# Patient Record
Sex: Female | Born: 1955
Health system: Southern US, Community
[De-identification: ages and names within clinical notes are randomized; demographics above are authoritative.]

## PROBLEM LIST (undated history)

## (undated) DIAGNOSIS — N8501 Benign endometrial hyperplasia: Secondary | ICD-10-CM

## (undated) DIAGNOSIS — R0681 Apnea, not elsewhere classified: Secondary | ICD-10-CM

## (undated) DIAGNOSIS — T8859XA Other complications of anesthesia, initial encounter: Secondary | ICD-10-CM

## (undated) DIAGNOSIS — N951 Menopausal and female climacteric states: Secondary | ICD-10-CM

## (undated) DIAGNOSIS — E669 Obesity, unspecified: Secondary | ICD-10-CM

## (undated) DIAGNOSIS — N950 Postmenopausal bleeding: Secondary | ICD-10-CM

## (undated) DIAGNOSIS — L719 Rosacea, unspecified: Secondary | ICD-10-CM

## (undated) DIAGNOSIS — E079 Disorder of thyroid, unspecified: Secondary | ICD-10-CM

## (undated) DIAGNOSIS — G4730 Sleep apnea, unspecified: Secondary | ICD-10-CM

## (undated) DIAGNOSIS — Z9109 Other allergy status, other than to drugs and biological substances: Secondary | ICD-10-CM

## (undated) DIAGNOSIS — F419 Anxiety disorder, unspecified: Secondary | ICD-10-CM

## (undated) DIAGNOSIS — I739 Peripheral vascular disease, unspecified: Secondary | ICD-10-CM

## (undated) DIAGNOSIS — K5792 Diverticulitis of intestine, part unspecified, without perforation or abscess without bleeding: Secondary | ICD-10-CM

## (undated) DIAGNOSIS — I10 Essential (primary) hypertension: Secondary | ICD-10-CM

## (undated) DIAGNOSIS — R3915 Urgency of urination: Secondary | ICD-10-CM

## (undated) HISTORY — DX: Diverticulitis of intestine, part unspecified, without perforation or abscess without bleeding: K57.92

## (undated) HISTORY — DX: Benign endometrial hyperplasia: N85.01

## (undated) HISTORY — PX: CHOLECYSTECTOMY: SHX55

## (undated) HISTORY — PX: HYSTEROSCOPY: SHX211

## (undated) HISTORY — DX: Obesity, unspecified: E66.9

## (undated) HISTORY — DX: Menopausal and female climacteric states: N95.1

## (undated) HISTORY — PX: DILATION AND CURETTAGE OF UTERUS: SHX78

## (undated) HISTORY — DX: Postmenopausal bleeding: N95.0

## (undated) HISTORY — DX: Urgency of urination: R39.15

## (undated) HISTORY — DX: Other allergy status, other than to drugs and biological substances: Z91.09

## (undated) HISTORY — DX: Anxiety disorder, unspecified: F41.9

## (undated) HISTORY — DX: Peripheral vascular disease, unspecified: I73.9

## (undated) HISTORY — DX: Rosacea, unspecified: L71.9

---

## 2005-04-06 ENCOUNTER — Ambulatory Visit: Payer: Self-pay

## 2005-06-03 ENCOUNTER — Ambulatory Visit: Payer: Self-pay | Admitting: Gastroenterology

## 2005-06-14 ENCOUNTER — Encounter: Payer: Self-pay | Admitting: Internal Medicine

## 2005-08-04 ENCOUNTER — Ambulatory Visit: Payer: Self-pay | Admitting: Gastroenterology

## 2005-11-15 ENCOUNTER — Encounter: Payer: Self-pay | Admitting: Internal Medicine

## 2006-04-27 ENCOUNTER — Ambulatory Visit: Payer: Self-pay

## 2007-06-14 ENCOUNTER — Ambulatory Visit: Payer: Self-pay

## 2007-07-03 ENCOUNTER — Ambulatory Visit: Payer: Self-pay | Admitting: Internal Medicine

## 2007-09-03 ENCOUNTER — Ambulatory Visit: Payer: Self-pay | Admitting: Internal Medicine

## 2007-12-19 ENCOUNTER — Ambulatory Visit: Payer: Self-pay | Admitting: Obstetrics and Gynecology

## 2007-12-19 ENCOUNTER — Other Ambulatory Visit: Payer: Self-pay

## 2007-12-28 ENCOUNTER — Ambulatory Visit: Payer: Self-pay | Admitting: Obstetrics and Gynecology

## 2008-07-02 ENCOUNTER — Ambulatory Visit: Payer: Self-pay | Admitting: Internal Medicine

## 2008-07-08 ENCOUNTER — Ambulatory Visit: Payer: Self-pay | Admitting: Internal Medicine

## 2009-01-14 ENCOUNTER — Ambulatory Visit: Payer: Self-pay | Admitting: Internal Medicine

## 2009-07-27 ENCOUNTER — Ambulatory Visit: Payer: Self-pay | Admitting: Internal Medicine

## 2009-08-05 ENCOUNTER — Ambulatory Visit: Payer: Self-pay | Admitting: Internal Medicine

## 2010-07-26 ENCOUNTER — Ambulatory Visit: Payer: Self-pay

## 2010-08-06 ENCOUNTER — Ambulatory Visit: Payer: Self-pay | Admitting: Internal Medicine

## 2010-12-17 ENCOUNTER — Emergency Department: Payer: Self-pay | Admitting: Emergency Medicine

## 2011-09-27 ENCOUNTER — Ambulatory Visit: Payer: Self-pay | Admitting: Internal Medicine

## 2011-12-28 ENCOUNTER — Ambulatory Visit: Payer: Self-pay | Admitting: Obstetrics and Gynecology

## 2012-02-09 ENCOUNTER — Other Ambulatory Visit: Payer: Self-pay | Admitting: Gastroenterology

## 2012-02-09 LAB — CBC WITH DIFFERENTIAL/PLATELET
Basophil #: 0.1 10*3/uL (ref 0.0–0.1)
Eosinophil %: 2.7 %
HCT: 37.2 % (ref 35.0–47.0)
MCH: 24.6 pg — ABNORMAL LOW (ref 26.0–34.0)
MCHC: 31.3 g/dL — ABNORMAL LOW (ref 32.0–36.0)
Monocyte %: 4.3 %
Neutrophil %: 67.3 %
Platelet: 211 10*3/uL (ref 150–440)
WBC: 8.2 10*3/uL (ref 3.6–11.0)

## 2012-02-16 ENCOUNTER — Other Ambulatory Visit: Payer: Self-pay | Admitting: Obstetrics and Gynecology

## 2012-02-16 LAB — BASIC METABOLIC PANEL
Anion Gap: 8 (ref 7–16)
EGFR (African American): >60
EGFR (Non-African Amer.): >60
Potassium: 3.4 mmol/L — ABNORMAL LOW (ref 3.5–5.1)

## 2012-02-16 LAB — PROTIME-INR
INR: 1
Prothrombin Time: 13.6 secs (ref 11.5–14.7)

## 2012-02-16 LAB — IRON AND TIBC
Iron Saturation: 9 %
Unbound Iron-Bind.Cap.: 328 ug/dL

## 2012-02-16 LAB — RETICULOCYTES: Reticulocyte: 3.2 % — ABNORMAL HIGH (ref 0.5–1.5)

## 2012-02-16 LAB — APTT: Activated PTT: 31.7 secs (ref 23.6–35.9)

## 2012-02-16 LAB — FERRITIN: Ferritin (ARMC): 50 ng/mL (ref 8–388)

## 2012-03-28 ENCOUNTER — Ambulatory Visit: Payer: Self-pay | Admitting: Gastroenterology

## 2012-05-16 ENCOUNTER — Ambulatory Visit: Payer: Self-pay | Admitting: Specialist

## 2012-09-27 ENCOUNTER — Ambulatory Visit: Payer: Self-pay | Admitting: Internal Medicine

## 2013-01-01 ENCOUNTER — Ambulatory Visit: Payer: Self-pay | Admitting: Specialist

## 2013-10-01 ENCOUNTER — Ambulatory Visit: Payer: Self-pay | Admitting: Internal Medicine

## 2014-03-11 ENCOUNTER — Ambulatory Visit: Payer: Self-pay | Admitting: Obstetrics and Gynecology

## 2014-04-10 DIAGNOSIS — I10 Essential (primary) hypertension: Secondary | ICD-10-CM | POA: Insufficient documentation

## 2014-04-10 DIAGNOSIS — D649 Anemia, unspecified: Secondary | ICD-10-CM | POA: Insufficient documentation

## 2014-04-10 DIAGNOSIS — G4733 Obstructive sleep apnea (adult) (pediatric): Secondary | ICD-10-CM | POA: Insufficient documentation

## 2014-04-10 DIAGNOSIS — E039 Hypothyroidism, unspecified: Secondary | ICD-10-CM | POA: Insufficient documentation

## 2014-04-10 DIAGNOSIS — E669 Obesity, unspecified: Secondary | ICD-10-CM | POA: Insufficient documentation

## 2014-04-10 DIAGNOSIS — E785 Hyperlipidemia, unspecified: Secondary | ICD-10-CM | POA: Insufficient documentation

## 2014-04-10 DIAGNOSIS — R739 Hyperglycemia, unspecified: Secondary | ICD-10-CM | POA: Insufficient documentation

## 2014-04-10 DIAGNOSIS — M549 Dorsalgia, unspecified: Secondary | ICD-10-CM | POA: Insufficient documentation

## 2014-05-09 ENCOUNTER — Ambulatory Visit: Payer: Self-pay | Admitting: Orthopedic Surgery

## 2014-11-12 DIAGNOSIS — M5116 Intervertebral disc disorders with radiculopathy, lumbar region: Secondary | ICD-10-CM | POA: Insufficient documentation

## 2014-11-12 DIAGNOSIS — M7060 Trochanteric bursitis, unspecified hip: Secondary | ICD-10-CM | POA: Insufficient documentation

## 2014-11-12 DIAGNOSIS — M48061 Spinal stenosis, lumbar region without neurogenic claudication: Secondary | ICD-10-CM | POA: Insufficient documentation

## 2015-02-27 ENCOUNTER — Other Ambulatory Visit: Payer: Self-pay | Admitting: Obstetrics and Gynecology

## 2015-02-27 DIAGNOSIS — Z1231 Encounter for screening mammogram for malignant neoplasm of breast: Secondary | ICD-10-CM

## 2015-03-16 ENCOUNTER — Other Ambulatory Visit: Payer: Self-pay | Admitting: Obstetrics and Gynecology

## 2015-03-16 ENCOUNTER — Ambulatory Visit
Admission: RE | Admit: 2015-03-16 | Discharge: 2015-03-16 | Disposition: A | Discharge status: Home / Self Care | Payer: 59 | Source: Ambulatory Visit | Attending: Obstetrics and Gynecology | Admitting: Obstetrics and Gynecology

## 2015-03-16 DIAGNOSIS — Z1231 Encounter for screening mammogram for malignant neoplasm of breast: Secondary | ICD-10-CM

## 2015-07-17 ENCOUNTER — Emergency Department: Payer: 59

## 2015-07-17 ENCOUNTER — Encounter: Payer: Self-pay | Admitting: Emergency Medicine

## 2015-07-17 ENCOUNTER — Emergency Department
Admission: EM | Admit: 2015-07-17 | Discharge: 2015-07-17 | Disposition: A | Discharge status: Home / Self Care | Payer: 59 | Attending: Emergency Medicine | Admitting: Emergency Medicine

## 2015-07-17 DIAGNOSIS — R079 Chest pain, unspecified: Secondary | ICD-10-CM | POA: Insufficient documentation

## 2015-07-17 DIAGNOSIS — I10 Essential (primary) hypertension: Secondary | ICD-10-CM | POA: Diagnosis not present

## 2015-07-17 DIAGNOSIS — E669 Obesity, unspecified: Secondary | ICD-10-CM | POA: Insufficient documentation

## 2015-07-17 DIAGNOSIS — R05 Cough: Secondary | ICD-10-CM | POA: Diagnosis not present

## 2015-07-17 DIAGNOSIS — R0602 Shortness of breath: Secondary | ICD-10-CM | POA: Diagnosis not present

## 2015-07-17 DIAGNOSIS — R5383 Other fatigue: Secondary | ICD-10-CM | POA: Insufficient documentation

## 2015-07-17 DIAGNOSIS — Z880 Allergy status to penicillin: Secondary | ICD-10-CM | POA: Insufficient documentation

## 2015-07-17 HISTORY — DX: Apnea, not elsewhere classified: R06.81

## 2015-07-17 HISTORY — DX: Disorder of thyroid, unspecified: E07.9

## 2015-07-17 HISTORY — DX: Essential (primary) hypertension: I10

## 2015-07-17 LAB — URINALYSIS COMPLETE WITH MICROSCOPIC (ARMC ONLY)
BACTERIA UA: NONE SEEN
Bilirubin Urine: NEGATIVE
GLUCOSE, UA: NEGATIVE mg/dL
HGB URINE DIPSTICK: NEGATIVE
Ketones, ur: NEGATIVE mg/dL
NITRITE: NEGATIVE
PH: 6 (ref 5.0–8.0)
PROTEIN: NEGATIVE mg/dL
SPECIFIC GRAVITY, URINE: 1.029 (ref 1.005–1.030)

## 2015-07-17 LAB — COMPREHENSIVE METABOLIC PANEL
ALT: 26 U/L (ref 14–54)
AST: 27 U/L (ref 15–41)
Albumin: 3.7 g/dL (ref 3.5–5.0)
Alkaline Phosphatase: 65 U/L (ref 38–126)
Anion gap: 8 (ref 5–15)
BUN: 13 mg/dL (ref 6–20)
CHLORIDE: 100 mmol/L — AB (ref 101–111)
CO2: 29 mmol/L (ref 22–32)
CREATININE: 1.03 mg/dL — AB (ref 0.44–1.00)
Calcium: 9 mg/dL (ref 8.9–10.3)
GFR, EST NON AFRICAN AMERICAN: 58 mL/min — AB (ref >60)
Glucose, Bld: 108 mg/dL — ABNORMAL HIGH (ref 65–99)
POTASSIUM: 3.5 mmol/L (ref 3.5–5.1)
SODIUM: 137 mmol/L (ref 135–145)
Total Bilirubin: 0.6 mg/dL (ref 0.3–1.2)
Total Protein: 7.4 g/dL (ref 6.5–8.1)

## 2015-07-17 LAB — CBC
HCT: 35.2 % (ref 35.0–47.0)
HEMOGLOBIN: 11.7 g/dL — AB (ref 12.0–16.0)
MCH: 26.4 pg (ref 26.0–34.0)
MCHC: 33.2 g/dL (ref 32.0–36.0)
MCV: 79.6 fL — ABNORMAL LOW (ref 80.0–100.0)
PLATELETS: 207 10*3/uL (ref 150–440)
RBC: 4.42 MIL/uL (ref 3.80–5.20)
RDW: 14.9 % — ABNORMAL HIGH (ref 11.5–14.5)
WBC: 10.1 10*3/uL (ref 3.6–11.0)

## 2015-07-17 LAB — TROPONIN I

## 2015-07-17 MED ORDER — IOHEXOL 300 MG/ML  SOLN
75.0000 mL | Freq: Once | INTRAMUSCULAR | Status: AC | PRN
  Administered 2015-07-17: 75 mL via INTRAVENOUS

## 2015-07-17 NOTE — ED Provider Notes (Signed)
[facility] Emergency Department Provider Note     Time seen: ----------------------------------------- 5:13 PM on 07/17/2015 -----------------------------------------    I have reviewed the triage vital signs and the nursing notes.   HISTORY  Chief Complaint Chest Pain    HPI Rebekah Perkins is a 59 y.o. female presents to ER for chest pressure over the last couple days. Patient been treated for cough about 2 weeks ago but hasn't gotten any better. Patient was sent over from employee health, denies fevers, chills. She does have shortness of breath that is worsened with exertion. She's not had sweats or nausea. According to reports she has history of thyroid disease hypertension and apnea.   Past Medical History  Diagnosis Date  . Thyroid disease   . Hypertension   . Apnea     There are no active problems to display for this patient.   History reviewed. No pertinent past surgical history.  Allergies Azithromycin and Penicillins  Social History Social History  Substance Use Topics  . Smoking status: Never Smoker   . Smokeless tobacco: None  . Alcohol Use: No    Review of Systems Constitutional: Negative for fever. Eyes: Negative for visual changes. ENT: Negative for sore throat. Cardiovascular: Positive for chest pressure Respiratory: Positive for shortness of breath, cough Gastrointestinal: Negative for abdominal pain, vomiting and diarrhea. Genitourinary: Negative for dysuria. Musculoskeletal: Negative for back pain. Skin: Negative for rash. Neurological: Negative for headaches, nausea for weakness  10-point ROS otherwise negative.  ____________________________________________   PHYSICAL EXAM:  VITAL SIGNS: ED Triage Vitals  Enc Vitals Group     BP 07/17/15 1651 100/59 mmHg     Pulse Rate 07/17/15 1651 71     Resp 07/17/15 1651 20     Temp 07/17/15 1651 97.6 F (36.4 C)     Temp Source 07/17/15 1651 Oral     SpO2  07/17/15 1651 95 %     Weight 07/17/15 1651 306 lb (138.801 kg)     Height 07/17/15 1651 5\' 3"  (1.6 m)     Head Cir --      Peak Flow --      Pain Score --      Pain Loc --      Pain Edu? --      Excl. in [place]? --     Constitutional: Alert and oriented. Mild distress, obese Eyes: Conjunctivae are normal. PERRL. Normal extraocular movements. ENT   Head: Normocephalic and atraumatic.   Nose: No congestion/rhinnorhea.   Mouth/Throat: Mucous membranes are moist.   Neck: No stridor. Cardiovascular: Normal rate, regular rhythm. Normal and symmetric distal pulses are present in all extremities. No murmurs, rubs, or gallops. Respiratory: Normal respiratory effort without tachypnea nor retractions. Breath sounds are clear and equal bilaterally. No wheezes/rales/rhonchi. Gastrointestinal: Soft and nontender. No distention. No abdominal bruits.  Musculoskeletal: Nontender with normal range of motion in all extremities. No joint effusions.  No lower extremity tenderness nor edema. Neurologic:  Normal speech and language. No gross focal neurologic deficits are appreciated. Speech is normal. No gait instability. Skin:  Skin is warm, dry and intact. No rash noted. Psychiatric: Mood and affect are normal. Speech and behavior are normal. Patient exhibits appropriate insight and judgment. ____________________________________________  EKG: Interpreted by me. Normal sinus rhythm with a rate of 73 bpm, normal PR interval, normal QS with, normal QT interval.  ____________________________________________  ED COURSE:  Pertinent labs & imaging results that were available during my care of the patient  were reviewed by me and considered in my medical decision making (see chart for details). Patient appears fatigued and short of breath on arrival. She was having nonspecific chest pressure. We'll check cardiac labs and reevaluate. ____________________________________________    LABS (pertinent  positives/negatives)  Labs Reviewed  CBC - Abnormal; Notable for the following:    Hemoglobin 11.7 (*)    MCV 79.6 (*)    RDW 14.9 (*)    All other components within normal limits  COMPREHENSIVE METABOLIC PANEL - Abnormal; Notable for the following:    Chloride 100 (*)    Glucose, Bld 108 (*)    Creatinine, Ser 1.03 (*)    GFR calc non Af Amer 58 (*)    All other components within normal limits  URINALYSIS COMPLETEWITH MICROSCOPIC ([facility] ONLY) - Abnormal; Notable for the following:    Color, Urine YELLOW (*)    APPearance HAZY (*)    Leukocytes, UA 1+ (*)    Squamous Epithelial / LPF 6-30 (*)    All other components within normal limits  TROPONIN I  TROPONIN I    RADIOLOGY Images were viewed by me  Chest x-ray IMPRESSION: 1. No acute findings in the thorax to account for the patient's symptoms. 2. There are subtle findings in the dependent portions of the lungs bilateral which could indicate very early changes of interstitial lung disease. Repeat high-resolution chest CT is recommended in 6-12 months to assess for temporal changes in the appearance of the lung parenchyma. 3. Hepatic steatosis with probable hepatomegaly. 4. Splenomegaly.   ____________________________________________  FINAL ASSESSMENT AND PLAN  Chest pressure, fatigue  Plan: Patient with labs and imaging as dictated above. Patient is in no acute distress, is refusing to have her second troponin drawn. At this point I am unclear of the etiology of her fatigue and weakness. She is low risk according to her heart score. Stable for outpatient follow-up with her doctor   Earleen Newport, MD   Earleen Newport, MD 07/17/15 2107

## 2015-07-17 NOTE — ED Notes (Signed)
Patient to ED with report of chest pressure over the last couple of days, patient treated for cough about 2 weeks ago but it hasn't gotten better. Patient sent over from employee health.

## 2015-07-17 NOTE — Discharge Instructions (Signed)
Chest Pain (Nonspecific) °It is often hard to give a specific diagnosis for the cause of chest pain. There is always a chance that your pain could be related to something serious, such as a heart attack or a blood clot in the lungs. You need to follow up with your health care provider for further evaluation. °CAUSES  °· Heartburn. °· Pneumonia or bronchitis. °· Anxiety or stress. °· Inflammation around your heart (pericarditis) or lung (pleuritis or pleurisy). °· A blood clot in the lung. °· A collapsed lung (pneumothorax). It can develop suddenly on its own (spontaneous pneumothorax) or from trauma to the chest. °· Shingles infection (herpes zoster virus). °The chest wall is composed of bones, muscles, and cartilage. Any of these can be the source of the pain. °· The bones can be bruised by injury. °· The muscles or cartilage can be strained by coughing or overwork. °· The cartilage can be affected by inflammation and become sore (costochondritis). °DIAGNOSIS  °Lab tests or other studies may be needed to find the cause of your pain. Your health care provider may have you take a test called an ambulatory electrocardiogram (ECG). An ECG records your heartbeat patterns over a 24-hour period. You may also have other tests, such as: °· Transthoracic echocardiogram (TTE). During echocardiography, sound waves are used to evaluate how blood flows through your heart. °· Transesophageal echocardiogram (TEE). °· Cardiac monitoring. This allows your health care provider to monitor your heart rate and rhythm in real time. °· Holter monitor. This is a portable device that records your heartbeat and can help diagnose heart arrhythmias. It allows your health care provider to track your heart activity for several days, if needed. °· Stress tests by exercise or by giving medicine that makes the heart beat faster. °TREATMENT  °· Treatment depends on what may be causing your chest pain. Treatment may include: °· Acid blockers for  heartburn. °· Anti-inflammatory medicine. °· Pain medicine for inflammatory conditions. °· Antibiotics if an infection is present. °· You may be advised to change lifestyle habits. This includes stopping smoking and avoiding alcohol, caffeine, and chocolate. °· You may be advised to keep your head raised (elevated) when sleeping. This reduces the chance of acid going backward from your stomach into your esophagus. °Most of the time, nonspecific chest pain will improve within 2-3 days with rest and mild pain medicine.  °HOME CARE INSTRUCTIONS  °· If antibiotics were prescribed, take them as directed. Finish them even if you start to feel better. °· For the next few days, avoid physical activities that bring on chest pain. Continue physical activities as directed. °· Do not use any tobacco products, including cigarettes, chewing tobacco, or electronic cigarettes. °· Avoid drinking alcohol. °· Only take medicine as directed by your health care provider. °· Follow your health care provider's suggestions for further testing if your chest pain does not go away. °· Keep any follow-up appointments you made. If you do not go to an appointment, you could develop lasting (chronic) problems with pain. If there is any problem keeping an appointment, call to reschedule. °SEEK MEDICAL CARE IF:  °· Your chest pain does not go away, even after treatment. °· You have a rash with blisters on your chest. °· You have a fever. °SEEK IMMEDIATE MEDICAL CARE IF:  °· You have increased chest pain or pain that spreads to your arm, neck, jaw, back, or abdomen. °· You have shortness of breath. °· You have an increasing cough, or you cough   up blood. °· You have severe back or abdominal pain. °· You feel nauseous or vomit. °· You have severe weakness. °· You faint. °· You have chills. °This is an emergency. Do not wait to see if the pain will go away. Get medical help at once. Call your local emergency services (911 in [place] Do not drive  yourself to the hospital. °MAKE SURE YOU:  °· Understand these instructions. °· Will watch your condition. °· Will get help right away if you are not doing well or get worse. °Document Released: 08/10/2005 Document Revised: 11/05/2013 Document Reviewed: 06/05/2008 °ExitCare® Patient Information ©2015 [employer]. This information is not intended to replace advice given to you by your health care provider. Make sure you discuss any questions you have with your health care provider. ° °Weakness °Weakness is a lack of strength. It may be felt all over the body (generalized) or in one specific part of the body (focal). Some causes of weakness can be serious. You may need further medical evaluation, especially if you are elderly or you have a history of immunosuppression (such as chemotherapy or HIV), kidney disease, heart disease, or diabetes. °CAUSES  °Weakness can be caused by many different things, including: °· Infection. °· Physical exhaustion. °· Internal bleeding or other blood loss that results in a lack of red blood cells (anemia). °· Dehydration. This cause is more common in elderly people. °· Side effects or electrolyte abnormalities from medicines, such as pain medicines or sedatives. °· Emotional distress, anxiety, or depression. °· Circulation problems, especially severe peripheral arterial disease. °· Heart disease, such as rapid atrial fibrillation, bradycardia, or heart failure. °· Nervous system disorders, such as Guillain-Barré syndrome, multiple sclerosis, or stroke. °DIAGNOSIS  °To find the cause of your weakness, your caregiver will take your history and perform a physical exam. Lab tests or X-rays may also be ordered, if needed. °TREATMENT  °Treatment of weakness depends on the cause of your symptoms and can vary greatly. °HOME CARE INSTRUCTIONS  °· Rest as needed. °· Eat a well-balanced diet. °· Try to get some exercise every day. °· Only take over-the-counter or prescription medicines as  directed by your caregiver. °SEEK MEDICAL CARE IF:  °· Your weakness seems to be getting worse or spreads to other parts of your body. °· You develop new aches or pains. °SEEK IMMEDIATE MEDICAL CARE IF:  °· You cannot perform your normal daily activities, such as getting dressed and feeding yourself. °· You cannot walk up and down stairs, or you feel exhausted when you do so. °· You have shortness of breath or chest pain. °· You have difficulty moving parts of your body. °· You have weakness in only one area of the body or on only one side of the body. °· You have a fever. °· You have trouble speaking or swallowing. °· You cannot control your bladder or bowel movements. °· You have black or bloody vomit or stools. °MAKE SURE YOU: °· Understand these instructions. °· Will watch your condition. °· Will get help right away if you are not doing well or get worse. °Document Released: 10/31/2005 Document Revised: 05/01/2012 Document Reviewed: 12/30/2011 °ExitCare® Patient Information ©2015 [employer]. This information is not intended to replace advice given to you by your health care provider. Make sure you discuss any questions you have with your health care provider. ° °

## 2015-07-21 ENCOUNTER — Ambulatory Visit (INDEPENDENT_AMBULATORY_CARE_PROVIDER_SITE_OTHER): Payer: 59 | Admitting: Obstetrics and Gynecology

## 2015-07-21 ENCOUNTER — Other Ambulatory Visit: Payer: 59

## 2015-07-21 ENCOUNTER — Encounter: Payer: Self-pay | Admitting: Obstetrics and Gynecology

## 2015-07-21 DIAGNOSIS — R938 Abnormal findings on diagnostic imaging of other specified body structures: Secondary | ICD-10-CM

## 2015-07-21 DIAGNOSIS — N950 Postmenopausal bleeding: Secondary | ICD-10-CM

## 2015-07-21 DIAGNOSIS — R9389 Abnormal findings on diagnostic imaging of other specified body structures: Secondary | ICD-10-CM

## 2015-07-21 DIAGNOSIS — K5790 Diverticulosis of intestine, part unspecified, without perforation or abscess without bleeding: Secondary | ICD-10-CM | POA: Insufficient documentation

## 2015-07-21 DIAGNOSIS — N8501 Benign endometrial hyperplasia: Secondary | ICD-10-CM | POA: Diagnosis not present

## 2015-07-21 NOTE — Progress Notes (Signed)
Patient ID: Rebekah Perkins, female   DOB: August 03, 1956, 59 y.o.   MRN: [id number]  U/s results- ? emb pmb  Chief complaint: 1.  History of endometrial hyperplasia without atypia. 2.  Follow-up on pelvic ultrasound.  Patient is a 59 year old white female, on no HRT therapy, on no progestin therapy, presents for follow-up on pelvic ultrasound done earlier today.  Previously, she had a diagnosis of endometrial hyperplasia without atypia; last ultrasound was 4.3 mm endometrial stripe; she is here now for 6 month follow-up ultrasound and possible biopsy.  Patient denies any abnormal bleeding since her last evaluation 6 months ago.  Previously, she also had a hysteroscopy D&C for simple hyperplasia without atypia.  Past medical history, past surgical history, problem list, medications, and allergies are reviewed and updated.  Review of systems: No abnormal uterine bleeding or vaginal discharge.  OBJECTIVE: BP 112/72 mmHg  Pulse 76  Ht 5\' 3"  (1.6 m)  Wt 299 lb 8 oz (135.852 kg)  BMI 53.07 kg/m2 Pleasant, well-appearing, morbidly obese Asian infant in no acute distress. Abdomen: Soft, protuberant, without organomegaly. Pelvic exam: Bimanual exam reveals a midplane uterus; size difficult to assess.  Endometrial Biopsy Procedure Note  Pre-operative Diagnosis:  1.  History of simple endometrial hyperplasia without atypia. 2.  Abnormal pelvic ultrasound with thickened endometrium 6.4 mm  Post-operative Diagnosis:  Same  Procedure Details   Urine pregnancy test was not done.  The risks (including infection, bleeding, pain, and uterine perforation) and benefits of the procedure were explained to the patient and Verbal informed consent was obtained.  Antibiotic prophylaxis against endocarditis was not indicated.   The patient was placed in the dorsal lithotomy position.  Bimanual exam showed the uterus to be in the neutral position.  A Graves' speculum inserted in the vagina, and the cervix  prepped with povidone iodine.  Endocervical curettage with a Kevorkian curette was not performed.   A sharp tenaculum was applied to the anterior lip of the cervix for stabilization.  A sterile uterine sound was used to sound the uterus to a depth of 8cm.  A Mylex 36mm curette was used to sample the endometrium.  Sample was sent for pathologic examination.This sample was scant.  Condition: Stable  Complications: None  Plan:  The patient was advised to call for any fever or for prolonged or severe pain or bleeding. She was advised to use OTC acetaminophen and OTC ibuprofen as needed for mild to moderate pain. She was advised to avoid vaginal intercourse for 48 hours or until the bleeding has completely stopped.  Attending Physician Documentation: Brayton Mars, MD   IMPRESSION: 1.  History of simple endometrial hyperplasia without atypia. 2.  Abnormal pelvic ultrasound today with thickened endometrial stripe at 6.4 mm 3.  Endometrial biopsy performed.  PLAN: 1.  Tylenol and Advil as needed for cramps. 2.  Return in 1 week for follow up on biopsy results and further management planning. 3.  Postbiopsy precautions given.  Brayton Mars, MD

## 2015-07-21 NOTE — Patient Instructions (Signed)
.  1.  Ultrasound showed thickened endometrium. 2.  Endometrial biopsy is performed today. 3.  Return in 1 week for follow-up. 4.  Tylenol/Advil as needed for cramps

## 2015-07-22 LAB — PATHOLOGY

## 2015-07-28 ENCOUNTER — Encounter: Payer: Self-pay | Admitting: Obstetrics and Gynecology

## 2015-07-28 ENCOUNTER — Ambulatory Visit (INDEPENDENT_AMBULATORY_CARE_PROVIDER_SITE_OTHER): Payer: 59 | Admitting: Obstetrics and Gynecology

## 2015-07-28 VITALS — BP 110/72 | HR 77 | Ht 63.0 in | Wt 301.5 lb

## 2015-07-28 DIAGNOSIS — N8501 Benign endometrial hyperplasia: Secondary | ICD-10-CM

## 2015-07-28 DIAGNOSIS — R938 Abnormal findings on diagnostic imaging of other specified body structures: Secondary | ICD-10-CM | POA: Diagnosis not present

## 2015-07-28 DIAGNOSIS — R9389 Abnormal findings on diagnostic imaging of other specified body structures: Secondary | ICD-10-CM

## 2015-07-28 MED ORDER — MEDROXYPROGESTERONE ACETATE 10 MG PO TABS: 10.0000 mg | ORAL_TABLET | Freq: Every day | ORAL | Status: DC

## 2015-07-28 NOTE — Patient Instructions (Signed)
1.  Take Provera 10 mg a day, days 1 through 10 each month starting in October 2016. 2.  Use the menstrual calendar, monitoring card to document any bleeding. 3.  An ultrasound of the pelvis is scheduled for mid March 2017. 4.  Return 1 week after the ultrasound for further management planning and evaluation of abnormal uterine bleeding.

## 2015-07-28 NOTE — Progress Notes (Signed)
Chief complaint: 1.  History of Simple endometrial hyperplasia without atypia 2.  Thickened endometrium, 6.4 mm on ultrasound 07/21/2015  Patient presents for follow-up conference to review endometrial biopsy and pelvic ultrasound findings. Endometrial biopsy-benign with evidence of atrophic endometrium with right down without evidence of hyperplasia or carcinoma. Pelvic ultrasound-individual stripe 6.4 mm Patient is not experiencing any abnormal uterine bleeding.  07/21/2015 Pelvic ultrasound-endometrial stripe 6.4 mm 07/21/2015 Endometrial biopsy-atrophic endometrium with breakdown without evidence of hyperplasia PLAN: 1.  Provera 10 mg a day, days 1 through 10. 2.  Repeat pelvic ultrasound in March 2017. 3.  Monitor for any Provera withdrawal bleeding.  A total of 15 minutes were spent face-to-face with the patient during this encounter and over half of that time dealt with counseling and coordination of care.  Brayton Mars, MD

## 2015-09-18 ENCOUNTER — Ambulatory Visit: Payer: Self-pay | Admitting: Physician Assistant

## 2015-12-08 DIAGNOSIS — Z872 Personal history of diseases of the skin and subcutaneous tissue: Secondary | ICD-10-CM | POA: Diagnosis not present

## 2015-12-08 DIAGNOSIS — L718 Other rosacea: Secondary | ICD-10-CM | POA: Diagnosis not present

## 2015-12-08 DIAGNOSIS — Z1283 Encounter for screening for malignant neoplasm of skin: Secondary | ICD-10-CM | POA: Diagnosis not present

## 2016-01-06 ENCOUNTER — Encounter: Payer: Self-pay | Admitting: Obstetrics and Gynecology

## 2016-01-06 ENCOUNTER — Ambulatory Visit (INDEPENDENT_AMBULATORY_CARE_PROVIDER_SITE_OTHER): Payer: 59 | Admitting: Obstetrics and Gynecology

## 2016-01-06 VITALS — BP 108/73 | HR 75 | Wt 294.4 lb

## 2016-01-06 DIAGNOSIS — Z1239 Encounter for other screening for malignant neoplasm of breast: Secondary | ICD-10-CM

## 2016-01-06 DIAGNOSIS — Z1211 Encounter for screening for malignant neoplasm of colon: Secondary | ICD-10-CM | POA: Diagnosis not present

## 2016-01-06 DIAGNOSIS — Z0000 Encounter for general adult medical examination without abnormal findings: Secondary | ICD-10-CM

## 2016-01-06 DIAGNOSIS — Z01419 Encounter for gynecological examination (general) (routine) without abnormal findings: Secondary | ICD-10-CM

## 2016-01-06 DIAGNOSIS — N8501 Benign endometrial hyperplasia: Secondary | ICD-10-CM | POA: Diagnosis not present

## 2016-01-06 NOTE — Progress Notes (Signed)
Patient ID: Rebekah Perkins, female   DOB: 03-05-1956, 60 y.o.   MRN: [id number] Annual visit: Provera 10mg . Days 1-10  GYN ANNUAL PREVENTATIVE CARE ENCOUNTER NOTE  Subjective:       Rebekah Perkins is a 60 y.o. No obstetric history on file. female here for a routine annual gynecologic exam.  Current complaints:  1.  History of simple endometrial hyperplasia without atypia; thickened endometrium, 6.4 mm, on ultrasound 9/6/thousand 16; endometrial biopsy-atrophic endometrium with breakdown without evidence of hyperplasia on 07/21/2015; treated with Provera 10 mg daily days 1 through 10 each month.     Gynecologic History No LMP recorded. Patient is postmenopausal. Contraception: post menopausal status Mammo: 03/16/2015 [place] 1 Pap: 12/25/2013 neg/neg History of hysteroscopy/D&C Family History of breast cancer in maternal grandmother and maternal aunt Obstetric History OB History  No data available    Past Medical History  Diagnosis Date  . Thyroid disease   . Hypertension   . Apnea   . Rosacea   . Simple endometrial hyperplasia   . PMB (postmenopausal bleeding)   . Anxiety   . PVD (peripheral vascular disease)   . Obesity   . Menopausal state   . Diverticulitis   . Environmental allergies   . Urinary urgency     Past Surgical History  Procedure Laterality Date  . Dilation and curettage of uterus    . Hysteroscopy    . Cholecystectomy      Current Outpatient Prescriptions on File Prior to Visit  Medication Sig Dispense Refill  . acetaminophen (TYLENOL) 500 MG tablet Take 1,500 mg by mouth every 6 (six) hours as needed for mild pain or headache.    Marland Kitchen aspirin EC 81 MG tablet Take 81 mg by mouth daily.    . bisoprolol-hydrochlorothiazide (ZIAC) 5-6.25 MG per tablet Take 1 tablet by mouth daily.    . calcium carbonate (OS-CAL) 600 MG TABS tablet Take 600 mg by mouth at bedtime.    . cetirizine (ZYRTEC) 10 MG tablet Take 10 mg by mouth at bedtime.    . ferrous sulfate 325 (65  FE) MG tablet Take 325 mg by mouth at bedtime.    . fluticasone (FLONASE) 50 MCG/ACT nasal spray Place 2 sprays into both nostrils daily.    Marland Kitchen gabapentin (NEURONTIN) 300 MG capsule Take 300 mg by mouth at bedtime.    . hydrochlorothiazide (HYDRODIURIL) 25 MG tablet Take 25 mg by mouth daily.    Marland Kitchen levothyroxine (SYNTHROID, LEVOTHROID) 150 MCG tablet Take 150 mcg by mouth daily before breakfast.    . medroxyPROGESTERone (PROVERA) 10 MG tablet Take 1 tablet (10 mg total) by mouth daily. Use for ten days on days 1-10 each month 10 tablet 6  . phentermine (ADIPEX-P) 37.5 MG tablet Take 37.5 mg by mouth daily.    . vitamin B-12 (CYANOCOBALAMIN) 1000 MCG tablet Take 1,000 mcg by mouth daily.     No current facility-administered medications on file prior to visit.    Allergies  Allergen Reactions  . Azithromycin Itching  . Penicillins Itching    Social History   Social History  . Marital Status: Single    Spouse Name: N/A  . Number of Children: N/A  . Years of Education: N/A   Occupational History  . Not on file.   Social History Main Topics  . Smoking status: Never Smoker   . Smokeless tobacco: Not on file  . Alcohol Use: No  . Drug Use: No  . Sexual Activity: No  Other Topics Concern  . Not on file   Social History Narrative    Family History  Problem Relation Age of Onset  . Cancer Maternal Grandmother   . Colon cancer Maternal Grandmother   . Breast cancer Maternal Grandmother   . Diabetes Mother   . Heart disease Mother   . Diabetes Sister   . Breast cancer Maternal Aunt   . Ovarian cancer Neg Hx     The following portions of the patient's history were reviewed and updated as appropriate: allergies, current medications, past family history, past medical history, past social history, past surgical history and problem list.  Review of Systems Review of Systems  Constitutional: Negative.   HENT: Negative.   Respiratory: Negative.   Cardiovascular: Negative.    Gastrointestinal: Negative.   Genitourinary: Negative.   Musculoskeletal:       Left ankle and heel pain No history of recent trauma  Skin: Negative.   Neurological: Negative.   Endo/Heme/Allergies: Negative.   Psychiatric/Behavioral: Negative.      Objective:   BP 108/73 mmHg  Pulse 75  Wt 294 lb 6 oz (133.528 kg)  Physical Exam  Constitutional: She is oriented to person, place, and time. She appears well-developed.  HENT:  Head: Atraumatic.  Eyes: Conjunctivae and EOM are normal.  Neck: Normal range of motion. Neck supple. No tracheal deviation present. No thyromegaly present.  Cardiovascular: Normal rate, regular rhythm and normal heart sounds.   Pulmonary/Chest: Effort normal and breath sounds normal. Right breast exhibits no mass, no nipple discharge, no skin change and no tenderness. Left breast exhibits no mass, no nipple discharge, no skin change and no tenderness. Breasts are symmetrical. There is no breast swelling.  Abdominal: Soft. She exhibits no distension and no mass. There is no tenderness. There is no guarding. No hernia.  Large pannus. No organomegaly  Genitourinary: No breast tenderness, discharge or bleeding.    External genitalia-normal. BUS-normal Vagina-normal. Cervix-normal. Uterus-nonpalpable secondary to body habitus. Adnexa-nonpalpable and nontender. Rectovaginal-external hemorrhoids; normal sphincter tone,; no rectal masses  Lymphadenopathy:    She has no cervical adenopathy.  Neurological: She is oriented to person, place, and time.  Skin: Skin is warm and dry.  Multiple skin tags on breasts and thorax. Benign-appearing macule right breast adjacent to the areola  Psychiatric: She has a normal mood and affect.    Assessment:   Annual gynecologic examination 60 y.o. Contraception: post menopausal status Obesity 2 History of simple endometrial hyperplasia without atypia; on Provera monthly with regular cyclic bleeding. Left ankle and  heel pain, unclear etiology Hypertension Diabetes  Plan:  Pap: Not needed Mammogram: Ordered Labs: per Dr. Doy Hutching Routine preventative health maintenance measures emphasized: Diet/Weight control, Tobacco Cessation and Alcohol/Drug use Continue cyclic Provera 10 mg a day, days 1 through 10 each month. Maintain menstrual calendar monitoring. Return in 6 months for all upon endometrial hyperplasia and bleeding. Consider podiatry evaluation for left ankle and heel pain Return to [place], MD  Note: This dictation was prepared with Dragon dictation along with smaller phrase technology. Any transcriptional errors that result from this process are unintentional.

## 2016-01-06 NOTE — Patient Instructions (Signed)
1.  No Pap smear is needed until 2018 2.  Mammogram ordered. 3.  Stool guaiac cards given. 4.  Pelvic ultrasound is ordered to assess endometrial stripe causes of endometrial hyperplasia.  History 5.  Continue with Provera 10 mg a day, days 1 through 10 each month. 6.  Maintain menstrual calendar monitoring. 7.  Return in 6 months for follow-up on endometrial hyperplasia. 8.  Return in one year for annual exam

## 2016-01-15 ENCOUNTER — Other Ambulatory Visit: Payer: 59

## 2016-01-22 ENCOUNTER — Other Ambulatory Visit: Payer: Self-pay | Admitting: Obstetrics and Gynecology

## 2016-01-22 DIAGNOSIS — N8500 Endometrial hyperplasia, unspecified: Secondary | ICD-10-CM

## 2016-01-26 ENCOUNTER — Ambulatory Visit (INDEPENDENT_AMBULATORY_CARE_PROVIDER_SITE_OTHER): Payer: 59

## 2016-01-26 DIAGNOSIS — N8500 Endometrial hyperplasia, unspecified: Secondary | ICD-10-CM | POA: Diagnosis not present

## 2016-02-03 ENCOUNTER — Ambulatory Visit (INDEPENDENT_AMBULATORY_CARE_PROVIDER_SITE_OTHER): Payer: 59 | Admitting: Obstetrics and Gynecology

## 2016-02-03 ENCOUNTER — Encounter: Payer: Self-pay | Admitting: Obstetrics and Gynecology

## 2016-02-03 VITALS — BP 119/85 | HR 67 | Wt 294.2 lb

## 2016-02-03 DIAGNOSIS — N8501 Benign endometrial hyperplasia: Secondary | ICD-10-CM

## 2016-02-03 NOTE — Progress Notes (Signed)
Chief complaint: 1. History of simple endometrial hyperplasia 2. Follow-up on pelvic ultrasound  Ultrasound demonstrates normal uterus with an endometrial stripe measuring 3.3 mm.  Patient is not experiencing any abnormal uterine bleeding or vaginal discharge  She continues to take Provera 10 mg a day days 1 through 10 each month.  OBJECTIVE: BP 119/85 mmHg  Pulse 67  Wt 294 lb 4 oz (133.471 kg)   ASSESSMENT: 1. History of simple endometrial hyperplasia without atypia 2. Normal pelvic ultrasound 3. No abnormal uterine bleeding with cyclic Provera  PLAN: 1. Continue with cyclic Provera 10 mg daily on days 1 through 10 each month 2. Maintain menstrual calendar monitoring 3. Return in 1 year for annual exam as scheduled or as needed if abnormal uterine bleeding or discharge.  A total of 15 minutes were spent face-to-face with the patient during this encounter and over half of that time dealt with counseling and coordination of care.  Brayton Mars, MD  Note: This dictation was prepared with Dragon dictation along with smaller phrase technology. Any transcriptional errors that result from this process are unintentional.

## 2016-02-03 NOTE — Patient Instructions (Signed)
1. Continue cyclic Provera 10 mg 10 day Days per month numbers 1 through 10. 2. Notify us if you develop any abnormal uterine bleeding or discharge.

## 2016-03-15 ENCOUNTER — Ambulatory Visit: Payer: Self-pay | Admitting: Physician Assistant

## 2016-03-15 ENCOUNTER — Encounter: Payer: Self-pay | Admitting: Physician Assistant

## 2016-03-15 VITALS — BP 124/82 | HR 72 | Temp 97.6°F

## 2016-03-15 DIAGNOSIS — J018 Other acute sinusitis: Secondary | ICD-10-CM

## 2016-03-15 MED ORDER — PREDNISONE 10 MG PO TABS: 30.0000 mg | ORAL_TABLET | Freq: Every day | ORAL | Status: DC

## 2016-03-15 MED ORDER — FLUCONAZOLE 150 MG PO TABS: 150.0000 mg | ORAL_TABLET | Freq: Once | ORAL | Status: DC

## 2016-03-15 MED ORDER — CEFDINIR 300 MG PO CAPS: 300.0000 mg | ORAL_CAPSULE | Freq: Two times a day (BID) | ORAL | Status: DC

## 2016-03-15 NOTE — Progress Notes (Signed)
S: C/o runny nose and congestion for 3 days, no fever, chills, cp/sob, v/d; mucus is green and thick, cough is sporadic, c/o of facial and dental pain.   Using otc meds: saline, flonase, and rx meds  O: PE: vitals wnl, nad, perrl eomi, normocephalic, tms dull, nasal mucosa red and swollen, throat injected, neck supple no lymph, lungs c t a, cv rrr, neuro intact  A:  Acute sinusitis   P: omnicef 300mg  bid, prednisone 30mg  qd x 3d, diflucan if needed; drink fluids, continue regular meds , use otc meds of choice, return if not improving in 5 days, return earlier if worsening

## 2016-04-12 DIAGNOSIS — E7800 Pure hypercholesterolemia, unspecified: Secondary | ICD-10-CM | POA: Diagnosis not present

## 2016-04-12 DIAGNOSIS — Z79899 Other long term (current) drug therapy: Secondary | ICD-10-CM | POA: Diagnosis not present

## 2016-04-12 DIAGNOSIS — R739 Hyperglycemia, unspecified: Secondary | ICD-10-CM | POA: Diagnosis not present

## 2016-04-12 DIAGNOSIS — I10 Essential (primary) hypertension: Secondary | ICD-10-CM | POA: Diagnosis not present

## 2016-04-18 ENCOUNTER — Other Ambulatory Visit: Payer: Self-pay | Admitting: Obstetrics and Gynecology

## 2016-04-18 ENCOUNTER — Other Ambulatory Visit: Payer: Self-pay | Admitting: Internal Medicine

## 2016-04-18 DIAGNOSIS — Z1231 Encounter for screening mammogram for malignant neoplasm of breast: Secondary | ICD-10-CM

## 2016-04-19 DIAGNOSIS — Z0000 Encounter for general adult medical examination without abnormal findings: Secondary | ICD-10-CM | POA: Diagnosis not present

## 2016-04-19 DIAGNOSIS — E039 Hypothyroidism, unspecified: Secondary | ICD-10-CM | POA: Diagnosis not present

## 2016-04-19 DIAGNOSIS — G4733 Obstructive sleep apnea (adult) (pediatric): Secondary | ICD-10-CM | POA: Diagnosis not present

## 2016-04-19 DIAGNOSIS — I10 Essential (primary) hypertension: Secondary | ICD-10-CM | POA: Diagnosis not present

## 2016-04-20 ENCOUNTER — Other Ambulatory Visit: Payer: Self-pay | Admitting: Internal Medicine

## 2016-04-20 DIAGNOSIS — M79671 Pain in right foot: Secondary | ICD-10-CM | POA: Diagnosis not present

## 2016-04-20 DIAGNOSIS — K5792 Diverticulitis of intestine, part unspecified, without perforation or abscess without bleeding: Secondary | ICD-10-CM

## 2016-04-20 DIAGNOSIS — D2371 Other benign neoplasm of skin of right lower limb, including hip: Secondary | ICD-10-CM | POA: Diagnosis not present

## 2016-04-28 ENCOUNTER — Other Ambulatory Visit: Payer: Self-pay | Admitting: Internal Medicine

## 2016-04-28 ENCOUNTER — Ambulatory Visit
Admission: RE | Admit: 2016-04-28 | Discharge: 2016-04-28 | Disposition: A | Discharge status: Home / Self Care | Payer: 59 | Source: Ambulatory Visit | Attending: Internal Medicine | Admitting: Internal Medicine

## 2016-04-28 DIAGNOSIS — Z1231 Encounter for screening mammogram for malignant neoplasm of breast: Secondary | ICD-10-CM | POA: Insufficient documentation

## 2016-05-03 ENCOUNTER — Ambulatory Visit
Admission: RE | Admit: 2016-05-03 | Discharge: 2016-05-03 | Disposition: A | Discharge status: Home / Self Care | Payer: 59 | Source: Ambulatory Visit | Attending: Internal Medicine | Admitting: Internal Medicine

## 2016-05-03 DIAGNOSIS — K5792 Diverticulitis of intestine, part unspecified, without perforation or abscess without bleeding: Secondary | ICD-10-CM | POA: Diagnosis not present

## 2016-05-03 DIAGNOSIS — R1032 Left lower quadrant pain: Secondary | ICD-10-CM | POA: Diagnosis not present

## 2016-05-03 MED ORDER — IOPAMIDOL (ISOVUE-300) INJECTION 61%
100.0000 mL | Freq: Once | INTRAVENOUS | Status: AC | PRN
  Administered 2016-05-03: 100 mL via INTRAVENOUS

## 2016-05-11 DIAGNOSIS — M79671 Pain in right foot: Secondary | ICD-10-CM | POA: Diagnosis not present

## 2016-05-11 DIAGNOSIS — D2371 Other benign neoplasm of skin of right lower limb, including hip: Secondary | ICD-10-CM | POA: Diagnosis not present

## 2016-07-05 ENCOUNTER — Encounter: Payer: Self-pay | Admitting: Obstetrics and Gynecology

## 2016-07-05 ENCOUNTER — Ambulatory Visit (INDEPENDENT_AMBULATORY_CARE_PROVIDER_SITE_OTHER): Payer: 59 | Admitting: Obstetrics and Gynecology

## 2016-07-05 VITALS — BP 113/69 | HR 69 | Ht 63.0 in | Wt 281.2 lb

## 2016-07-05 DIAGNOSIS — K5790 Diverticulosis of intestine, part unspecified, without perforation or abscess without bleeding: Secondary | ICD-10-CM | POA: Diagnosis not present

## 2016-07-05 DIAGNOSIS — R638 Other symptoms and signs concerning food and fluid intake: Secondary | ICD-10-CM | POA: Diagnosis not present

## 2016-07-05 DIAGNOSIS — N8501 Benign endometrial hyperplasia: Secondary | ICD-10-CM | POA: Diagnosis not present

## 2016-07-05 NOTE — Patient Instructions (Signed)
1. Continue with Provera 10 mg a day days 1 through 10 each month 2. Maintain menstrual calendar monitoring 3. Return in February 2017 for annual exam or if abnormal uterine bleeding develops

## 2016-07-05 NOTE — Progress Notes (Signed)
Chief complaint: 1. History of simple endometrial hyperplasia without atypia 2. Increased BMI  Patient presents for follow-up. She continues with Provera 10 mg a day days 1 through 10 each month. She continues to experience amenorrhea. Previous ultrasound of endometrium in 01/2016 was notable for a stripe of 3.3 mm  Main complaints today include a history of recurrent bouts of diverticular disease. She continues to experience bouts of pelvic cramping and loose stools. I recommended she follow up with Dr. Georgie Chard for further management of this condition and possible consideration of GI consultation if necessary.  OBJECTIVE: BP 113/69   Pulse 69   Ht 5\' 3"  (1.6 m)   Wt 281 lb 3.2 oz (127.6 kg)   BMI 49.81 kg/m  Physical exam-deferred  ASSESSMENT: 1. History of simple endometrial hyperplasia without atypia 2. Increased BMI 3. Amenorrhea with cyclic progestin therapy  PLAN: 1. Continue with cyclic Provera 10 mg a day days 1 through 10 each month 2. Maintain menstrual calendar monitoring 3. Return for annual gynecologic physical in February 2018 or sooner if abnormal uterine bleeding develops 4. Recommend follow-up with Dr. Georgie Chard for further evaluation and management of chronic diverticular disease  A total of 15 minutes were spent face-to-face with the patient during this encounter and over half of that time dealt with counseling and coordination of care.  Brayton Mars, MD  Note: This dictation was prepared with Dragon dictation along with smaller phrase technology. Any transcriptional errors that result from this process are unintentional.

## 2016-07-28 ENCOUNTER — Encounter: Payer: Self-pay | Admitting: Physician Assistant

## 2016-07-28 ENCOUNTER — Ambulatory Visit: Payer: Self-pay | Admitting: Family

## 2016-07-28 VITALS — BP 130/90 | HR 72 | Temp 97.7°F

## 2016-07-28 DIAGNOSIS — J019 Acute sinusitis, unspecified: Secondary | ICD-10-CM

## 2016-07-28 MED ORDER — LEVOFLOXACIN 500 MG PO TABS: 500.0000 mg | ORAL_TABLET | Freq: Every day | ORAL | 0 refills | Status: DC

## 2016-07-28 NOTE — Progress Notes (Signed)
S one week hx nasal congestion, headache in sinuses. PND, not responding to otc meds, saline etc Malaise , denies cp or sob O/ VSS mildly ill appearing  ENT nasal turbinates swollen, red , green rhinorhea noted, + frontal/ethmoid tenderness, less so to maxillary, neck supple ,tight musculature, without nodes heart rsr lungs clear A/ acute rhinosinusitis P/ Supportive measures discussed. Follow up prn not improving   rx levaquin 500 mg one daily #10 o rf.

## 2016-09-26 DIAGNOSIS — H52203 Unspecified astigmatism, bilateral: Secondary | ICD-10-CM | POA: Diagnosis not present

## 2016-11-09 DIAGNOSIS — E039 Hypothyroidism, unspecified: Secondary | ICD-10-CM | POA: Diagnosis not present

## 2016-11-09 DIAGNOSIS — E7800 Pure hypercholesterolemia, unspecified: Secondary | ICD-10-CM | POA: Diagnosis not present

## 2016-11-09 DIAGNOSIS — N390 Urinary tract infection, site not specified: Secondary | ICD-10-CM | POA: Diagnosis not present

## 2016-11-09 DIAGNOSIS — E538 Deficiency of other specified B group vitamins: Secondary | ICD-10-CM | POA: Diagnosis not present

## 2016-11-09 DIAGNOSIS — G4733 Obstructive sleep apnea (adult) (pediatric): Secondary | ICD-10-CM | POA: Diagnosis not present

## 2016-11-09 DIAGNOSIS — Z79899 Other long term (current) drug therapy: Secondary | ICD-10-CM | POA: Diagnosis not present

## 2016-11-09 DIAGNOSIS — R739 Hyperglycemia, unspecified: Secondary | ICD-10-CM | POA: Diagnosis not present

## 2016-11-09 DIAGNOSIS — I10 Essential (primary) hypertension: Secondary | ICD-10-CM | POA: Diagnosis not present

## 2016-11-17 ENCOUNTER — Other Ambulatory Visit: Payer: Self-pay | Admitting: Obstetrics and Gynecology

## 2016-12-07 DIAGNOSIS — L718 Other rosacea: Secondary | ICD-10-CM | POA: Diagnosis not present

## 2016-12-07 DIAGNOSIS — Z872 Personal history of diseases of the skin and subcutaneous tissue: Secondary | ICD-10-CM | POA: Diagnosis not present

## 2016-12-07 DIAGNOSIS — Z86018 Personal history of other benign neoplasm: Secondary | ICD-10-CM | POA: Diagnosis not present

## 2016-12-19 DIAGNOSIS — M7061 Trochanteric bursitis, right hip: Secondary | ICD-10-CM | POA: Diagnosis not present

## 2016-12-19 DIAGNOSIS — M48062 Spinal stenosis, lumbar region with neurogenic claudication: Secondary | ICD-10-CM | POA: Diagnosis not present

## 2016-12-19 DIAGNOSIS — M5416 Radiculopathy, lumbar region: Secondary | ICD-10-CM | POA: Diagnosis not present

## 2016-12-19 DIAGNOSIS — M7062 Trochanteric bursitis, left hip: Secondary | ICD-10-CM | POA: Diagnosis not present

## 2017-01-03 ENCOUNTER — Ambulatory Visit: Payer: 59 | Attending: Physical Medicine and Rehabilitation | Admitting: Physical Therapy

## 2017-01-03 ENCOUNTER — Encounter: Payer: Self-pay | Admitting: Physical Therapy

## 2017-01-03 DIAGNOSIS — M25552 Pain in left hip: Secondary | ICD-10-CM

## 2017-01-03 DIAGNOSIS — M7062 Trochanteric bursitis, left hip: Secondary | ICD-10-CM | POA: Diagnosis not present

## 2017-01-03 DIAGNOSIS — M7061 Trochanteric bursitis, right hip: Secondary | ICD-10-CM | POA: Diagnosis not present

## 2017-01-03 NOTE — Therapy (Addendum)
[place] MAIN [facility] SERVICES [address] [place], [place], [address] Phone: [phone]   Fax:  [phone]  Physical Therapy Evaluation  Patient Details  Name: MCKENA KRUER MRN: [id number] Date of Birth: 09-16-1956 Referring Provider: Sharlet Salina  Encounter Date: 01/03/2017      PT End of Session - 01/03/17 1632    Visit Number 1   Number of Visits 17   Date for PT Re-Evaluation 02/28/17   PT Start Time 0410   PT Stop Time 0500   PT Time Calculation (min) 50 min   Activity Tolerance Patient tolerated treatment well   Behavior During Therapy [facility] for tasks assessed/performed      Past Medical History:  Diagnosis Date  . Anxiety   . Apnea   . Diverticulitis   . Environmental allergies   . Hypertension   . Menopausal state   . Obesity   . PMB (postmenopausal bleeding)   . PVD (peripheral vascular disease) ([place])   . Rosacea   . Simple endometrial hyperplasia   . Thyroid disease   . Urinary urgency     Past Surgical History:  Procedure Laterality Date  . CHOLECYSTECTOMY    . DILATION AND CURETTAGE OF UTERUS    . HYSTEROSCOPY      There were no vitals filed for this visit.       Subjective Assessment - 01/03/17 1616    Subjective Patient states she has pain in her bilateral hips that began late 2016 with L hip > R hip, and her R hip only causes her pain when she is lying on it. Patient states the longer she walks, the worse her pain is. She also states her R leg feels like it is being dragged when she walks for too long.   Pertinent History Patient's B hip pain begain in September 2016 and she had shots in both hips recently for trochanteric bursitis. She states the shots worked well on the R hip, but not as well on the L hip.   Limitations Standing;Walking   How long can you sit comfortably? 10 minutes   How long can you stand comfortably? unlimited   How long can you walk comfortably? 45 minutes   Diagnostic  tests MRI, xray of lumbar spine   Patient Stated Goals wants to be able to know what she can safely do   Currently in Pain? Yes   Pain Score 4    Pain Location Hip   Pain Orientation Left   Pain Descriptors / Indicators Dull;Burning;Throbbing   Pain Type Chronic pain   Pain Onset More than a month ago   Pain Frequency Intermittent   Aggravating Factors  walking, lying on R or L side   Pain Relieving Factors take weight off of LE   Effect of Pain on Daily Activities affects toward the end of the day and no longer feels like doing housework   Multiple Pain Sites No            [facility] PT Assessment - 01/03/17 1624      Assessment   Medical Diagnosis trochanteric bursitis in both hips   Referring Provider CHASNIS, BENJAMIN   Onset Date/Surgical Date 08/04/15   Hand Dominance Left   Next MD Visit March 02, 2017   Prior Therapy None     Precautions   Precautions None     Restrictions   Weight Bearing Restrictions No     Balance Screen   Has the  patient fallen in the past 6 months No   Has the patient had a decrease in activity level because of a fear of falling?  Yes   Is the patient reluctant to leave their home because of a fear of falling?  No     Home Ecologist residence   Living Arrangements Children;Other relatives   Type of [place] to enter   Entrance Stairs-Number of Steps 3   Entrance Stairs-Rails None   Home Layout One level   [place] bars - tub/shower;Wheelchair - [employer] - 2 wheels;Transport chair;Shower seat;Cane - single point     Prior Function   Level of Independence Independent   Vocation Full time employment   Vocation Requirements standing, walking, lifting, sitting   Leisure read       PAIN: reports 4/10 pain in L hip with tenderness to palpation, 0/10 R hip     POSTURE: WFL     PROM/AROM:  hip flexion and adduction limited due to pain   STRENGTH:  Graded on a 0-5  scale Muscle Group Left Right  Shoulder flex NT NT  Shoulder Abd NT NT  Shoulder Ext NT NT  Shoulder IR/ER NT NT  Elbow NT NT  Wrist/hand NT NT  Hip Flex 4/5 4/5  Hip Abd 3/5 4/5  Hip Add 3/5 3/5  Hip Ext 4/5 4/5  Hip IR/ER 3/5 3/5  Knee Flex 4/5 4/5  Knee Ext 3/5 3/5  Ankle DF 5/5 5/5  Ankle PF NT NT    SENSATION: intermittent numbness and tingling in hands   FUNCTIONAL MOBILITY:  WFL for transfers and bed mobility   GAIT:  patient presents with antalgic gait and decreased gait speed, she is able to ambulate intermediate and long distances  Special Tests: Trendelenburg: negative, but produced pain on R side Scour: negative FABER: negative External derotation test: negative   Outcome measures: LEFS: 40/80 (the lower the score, the greater the disability) 41m walk test: 1.47m/s (<1.0 m/s indicates increased risk for falls; limited community ambulator)  Therapeutic  Exercise; 3 way hip x 15 reps BLE  No increased pain following treatment.        PT Education - 01/03/17 1632    Education provided Yes   Education Details plan of care, hip flexion and extension with red theraband, safety with HEP   Person(s) Educated Patient   Methods Explanation   Comprehension Verbalized understanding             PT Long Term Goals - 01/03/17 1808      PT LONG TERM GOAL #1   Title Patient will be independent in home exercise program to improve strength/mobility for better functional independence with ADLs.   Time 8   Period Weeks   Status New     PT LONG TERM GOAL #2   Title Patient will increase BLE gross strength to 4+/5 as to improve functional strength for independent gait, increased standing tolerance and increased ADL ability.   Baseline 3+/5   Time 8   Period Weeks   Status New     PT LONG TERM GOAL #3   Title Patient will report a worst pain of 3/10 on VAS in B hips to improve tolerance with ADLs and reduced symptoms with activities.    Baseline 6/10    Time 8   Period Weeks   Status New     PT LONG TERM GOAL #4  Title Patient will increase lower extremity functional scale to >60/80 to demonstrate improved functional mobility and increased tolerance with ADLs.    Baseline 40   Time 8   Period Weeks   Status New              Plan - 01/03/17 1757    Clinical Impression Statement This 61 y/o patient presents to PT with intermittent L hip pain and occasional R hip pain with pain ranging from 0/10 to 6/10. She has tenderness to palpation of B greater trochanters. She has an LEFS of 40/80 which indicates moderate disability, decreased BLE strength, and antalgic gait. Patient is unable to participate in desired activities. Patient will benefit from skilled physical therapy in order to increase BLE strength, decrease bilateral hip pain, and improve her ability to participate in desired activities.   Rehab Potential Fair   Clinical Impairments Affecting Rehab Potential This patient presents with 1 personal factor: current experience, and 3 body elements including body structures and functions, activity limitations and/or participation restrictions including decreased strength, pain, and participations restrictions such as inability to participate in life situations.  Patient's condition is evolving.   PT Frequency 2x / week   PT Duration 8 weeks   PT Treatment/Interventions ADLs/Self Care Home Management;Aquatic Therapy;Cryotherapy;Electrical Stimulation;Iontophoresis 4mg /ml Dexamethasone;Stair training;Gait training;Ultrasound;Moist Heat;Therapeutic activities;Therapeutic exercise;Patient/family education;Passive range of motion;Manual techniques;Dry needling;Taping   PT Next Visit Plan strengthening   PT Home Exercise Plan hip flexion and extension with red theraband   Consulted and Agree with Plan of Care Patient      Patient will benefit from skilled therapeutic intervention in order to improve the following deficits and impairments:   Decreased balance, Abnormal gait, Decreased endurance, Decreased coordination, Decreased mobility, Decreased strength, Decreased range of motion, Difficulty walking, Hypomobility, Impaired flexibility, Increased fascial restricitons, Pain  Visit Diagnosis: Trochanteric bursitis of both hips  Pain in left hip     Problem List Patient Active Problem List   Diagnosis Date Noted  . Simple endometrial hyperplasia without atypia 02/03/2016  . Endometrial hyperplasia without atypia, simple 07/21/2015  . Diverticulosis 07/21/2015  . Neuritis or radiculitis due to rupture of lumbar intervertebral disc 11/12/2014  . Lumbar canal stenosis 11/12/2014  . Bursitis, trochanteric 11/12/2014  . Back ache 04/10/2014  . Chronic anemia 04/10/2014  . BP (high blood pressure) 04/10/2014  . Blood glucose elevated 04/10/2014  . HLD (hyperlipidemia) 04/10/2014  . Adult hypothyroidism 04/10/2014  . Adiposity 04/10/2014  . Obstructive apnea 04/10/2014  This entire session was performed under direct supervision and direction of a licensed therapist/therapist assistant . I have personally read, edited and approve of the note as written. Betsy Coder, SPT [place], [place], DPT 01/03/2017, 6:12 PM  [place] MAIN [facility] SERVICES [address] [place], [place], [address] Phone: [phone]   Fax:  [phone]  Name: SARANNE PINTA MRN: [id number] Date of Birth: [birth year]

## 2017-01-04 NOTE — Addendum Note (Signed)
Addended by: Alanson Puls on: 01/04/2017 11:15 AM   Modules accepted: Orders

## 2017-01-05 ENCOUNTER — Encounter: Payer: 59 | Admitting: Obstetrics and Gynecology

## 2017-01-06 ENCOUNTER — Encounter: Payer: Self-pay | Admitting: Obstetrics and Gynecology

## 2017-01-06 ENCOUNTER — Ambulatory Visit (INDEPENDENT_AMBULATORY_CARE_PROVIDER_SITE_OTHER): Payer: 59 | Admitting: Obstetrics and Gynecology

## 2017-01-06 VITALS — BP 110/69 | HR 73 | Ht 63.0 in | Wt 273.8 lb

## 2017-01-06 DIAGNOSIS — Z01419 Encounter for gynecological examination (general) (routine) without abnormal findings: Secondary | ICD-10-CM

## 2017-01-06 DIAGNOSIS — N8501 Benign endometrial hyperplasia: Secondary | ICD-10-CM | POA: Diagnosis not present

## 2017-01-06 DIAGNOSIS — Z1231 Encounter for screening mammogram for malignant neoplasm of breast: Secondary | ICD-10-CM | POA: Diagnosis not present

## 2017-01-06 DIAGNOSIS — Z1239 Encounter for other screening for malignant neoplasm of breast: Secondary | ICD-10-CM

## 2017-01-06 DIAGNOSIS — Z1211 Encounter for screening for malignant neoplasm of colon: Secondary | ICD-10-CM | POA: Diagnosis not present

## 2017-01-06 MED ORDER — MEDROXYPROGESTERONE ACETATE 10 MG PO TABS: 10.0000 mg | ORAL_TABLET | Freq: Every day | ORAL | 6 refills | Status: DC

## 2017-01-06 NOTE — Progress Notes (Signed)
Patient ID: Rebekah Perkins, female   DOB: 04-15-1956, 61 y.o.   MRN: [id number] Annual visit: Provera 10mg . Days 1-10  GYN ANNUAL PREVENTATIVE CARE ENCOUNTER NOTE  Subjective:       Rebekah Perkins is a 61 y.o. G3 P87 female here for a routine annual gynecologic exam.  Current complaints:  1.  History of simple endometrial hyperplasia without atypia; thickened endometrium, 6.4 mm, on ultrasound 07/21/2015; endometrial biopsy-atrophic endometrium with breakdown without evidence of hyperplasia on 07/21/2015; treated with Provera 10 mg daily days 1 through 10 each month.   Currently no abnormal uterine bleeding on Provera therapy.  Major interval health issues include bilateral hip pain with bursitis impairing physical activity   Gynecologic History No LMP recorded. Patient is postmenopausal. Contraception: post menopausal status Mammo: 03/2016 [place] 1 Pap: 12/25/2013 neg/neg History of hysteroscopy/D&C Family History of breast cancer in maternal grandmother and maternal aunt Obstetric History OB History  No data available    Past Medical History:  Diagnosis Date  . Anxiety   . Apnea   . Diverticulitis   . Environmental allergies   . Hypertension   . Menopausal state   . Obesity   . PMB (postmenopausal bleeding)   . PVD (peripheral vascular disease) ([place])   . Rosacea   . Simple endometrial hyperplasia   . Thyroid disease   . Urinary urgency     Past Surgical History:  Procedure Laterality Date  . CHOLECYSTECTOMY    . DILATION AND CURETTAGE OF UTERUS    . HYSTEROSCOPY      Current Outpatient Prescriptions on File Prior to Visit  Medication Sig Dispense Refill  . acetaminophen (TYLENOL) 500 MG tablet Take 1,500 mg by mouth every 6 (six) hours as needed for mild pain or headache.    Marland Kitchen aspirin EC 81 MG tablet Take 81 mg by mouth daily.    . bisoprolol-hydrochlorothiazide (ZIAC) 5-6.25 MG per tablet Take 1 tablet by mouth daily.    . calcium carbonate (OS-CAL) 600 MG TABS  tablet Take 600 mg by mouth at bedtime.    . cetirizine (ZYRTEC) 10 MG tablet Take 10 mg by mouth at bedtime.    . ferrous sulfate 325 (65 FE) MG tablet Take 325 mg by mouth at bedtime.    . fluticasone (FLONASE) 50 MCG/ACT nasal spray Place 2 sprays into both nostrils daily.    Marland Kitchen gabapentin (NEURONTIN) 300 MG capsule Take 300 mg by mouth at bedtime.    . hydrochlorothiazide (HYDRODIURIL) 25 MG tablet Take 25 mg by mouth daily.    Marland Kitchen levofloxacin (LEVAQUIN) 500 MG tablet Take 1 tablet (500 mg total) by mouth daily. 10 tablet 0  . levothyroxine (SYNTHROID, LEVOTHROID) 150 MCG tablet Take by mouth.    . medroxyPROGESTERone (PROVERA) 10 MG tablet TAKE 1 TABLET BY MOUTH ONCE DAILY ON DAYS 1 TO 10 EACH MONTH 10 tablet 6  . pantoprazole (PROTONIX) 40 MG tablet Take by mouth.    . phentermine (ADIPEX-P) 37.5 MG tablet Take 37.5 mg by mouth daily.    . traZODone (DESYREL) 50 MG tablet Take by mouth.    . vitamin B-12 (CYANOCOBALAMIN) 1000 MCG tablet Take 1,000 mcg by mouth daily.     No current facility-administered medications on file prior to visit.     Allergies  Allergen Reactions  . Azithromycin Itching  . Penicillins Itching    Social History   Social History  . Marital status: Single    Spouse name: N/A  . Number  of children: N/A  . Years of education: N/A   Occupational History  . Not on file.   Social History Main Topics  . Smoking status: Passive Smoke Exposure - Never Smoker  . Smokeless tobacco: Never Used  . Alcohol use No  . Drug use: No  . Sexual activity: No   Other Topics Concern  . Not on file   Social History Narrative  . No narrative on file    Family History  Problem Relation Age of Onset  . Cancer Maternal Grandmother   . Colon cancer Maternal Grandmother   . Breast cancer Maternal Grandmother 80  . Diabetes Mother   . Heart disease Mother   . Diabetes Sister   . Breast cancer Maternal Aunt 60  . Ovarian cancer Neg Hx     The following portions  of the patient's history were reviewed and updated as appropriate: allergies, current medications, past family history, past medical history, past social history, past surgical history and problem list.  Review of Systems Review of Systems  Constitutional: Negative for chills and fever.  HENT: Negative.   Respiratory: Negative.   Cardiovascular: Negative.   Gastrointestinal: Negative for abdominal pain, blood in stool, constipation, nausea and vomiting.  Genitourinary: Negative for flank pain.  Musculoskeletal: Positive for joint pain.       Bilateral hip pain/bursitis complicating exercise/activities  Skin: Negative for itching and rash.  Neurological: Positive for weakness.  Endo/Heme/Allergies: Negative.   Psychiatric/Behavioral: Negative.     BP 110/69   Pulse 73   Ht 5\' 3"  (1.6 m)   Wt 273 lb 12.8 oz (124.2 kg)   BMI 48.50 kg/m    Physical Exam  Constitutional: She is oriented to person, place, and time. She appears well-developed.  HENT:  Head: Normocephalic and atraumatic.  Eyes: Conjunctivae and EOM are normal. No scleral icterus.  Neck: Normal range of motion. Neck supple. No tracheal deviation present. No thyromegaly present.  Cardiovascular: Normal rate, regular rhythm and normal heart sounds.   Pulmonary/Chest: Effort normal and breath sounds normal. Right breast exhibits no mass, no nipple discharge, no skin change and no tenderness. Left breast exhibits no mass, no nipple discharge, no skin change and no tenderness. Breasts are symmetrical. There is no breast swelling.  Abdominal: Soft. She exhibits no distension and no mass. There is no tenderness. There is no guarding. No hernia.  Large pannus. No organomegaly No rash  Genitourinary: No breast tenderness, discharge or bleeding.  Genitourinary Comments:   External genitalia-normal. BUS-normal Vagina-normal. Cervix-normal. Uterus-nonpalpable secondary to body habitus. Nonenlarged Adnexa-nonpalpable and  nontender. Rectovaginal-external hemorrhoids; normal sphincter tone,; no rectal masses. Moderate stool in vault  Lymphadenopathy:    She has no cervical adenopathy.  Neurological: She is oriented to person, place, and time.  Skin: Skin is warm and dry.  Multiple skin tags on breasts and thorax. Benign-appearing macule right breast adjacent to the areola  Psychiatric: She has a normal mood and affect.    Assessment:   Annual gynecologic examination 61 y.o. Contraception: post menopausal status Obesity 2 History of simple endometrial hyperplasia without atypia; on Provera monthly; No withdrawal bleeding Bilateral hip pain/bursitis Hypertension Diabetes  Plan:  Pap:pap w/hpv Mammogram: Ordered Labs: per Dr. Doy Hutching  Stool cards- ordered Routine preventative health maintenance measures emphasized: Diet/Weight control, Tobacco Cessation and Alcohol/Drug use Continue cyclic Provera 10 mg a day, days 1 through 10 each month. Maintain menstrual calendar monitoring. Return to [place], [place]  Brayton Mars, MD   Note: This dictation was prepared with Dragon dictation along with smaller phrase technology. Any transcriptional errors that result from this process are unintentional.

## 2017-01-06 NOTE — Patient Instructions (Signed)
1. Pap smear is done 2. Mammogram is ordered 3. Stool guaiac cards are given 4. Screening labs are done through Dr. Doy Hutching 5. Continue with healthy eating and physical therapy with an attempt to increase exercise 6. Continue with calcium and vitamin D supplementation twice a day 7. Return in 1 year   Health Maintenance for Postmenopausal Women Introduction Menopause is a normal process in which your reproductive ability comes to an end. This process happens gradually over a span of months to years, usually between the ages of 26 and 37. Menopause is complete when you have missed 12 consecutive menstrual periods. It is important to talk with your health care provider about some of the most common conditions that affect postmenopausal women, such as heart disease, cancer, and bone loss (osteoporosis). Adopting a healthy lifestyle and getting preventive care can help to promote your health and wellness. Those actions can also lower your chances of developing some of these common conditions. What should I know about menopause? During menopause, you may experience a number of symptoms, such as:  Moderate-to-severe hot flashes.  Night sweats.  Decrease in sex drive.  Mood swings.  Headaches.  Tiredness.  Irritability.  Memory problems.  Insomnia. Choosing to treat or not to treat menopausal changes is an individual decision that you make with your health care provider. What should I know about hormone replacement therapy and supplements? Hormone therapy products are effective for treating symptoms that are associated with menopause, such as hot flashes and night sweats. Hormone replacement carries certain risks, especially as you become older. If you are thinking about using estrogen or estrogen with progestin treatments, discuss the benefits and risks with your health care provider. What should I know about heart disease and stroke? Heart disease, heart attack, and stroke become more  likely as you age. This may be due, in part, to the hormonal changes that your body experiences during menopause. These can affect how your body processes dietary fats, triglycerides, and cholesterol. Heart attack and stroke are both medical emergencies. There are many things that you can do to help prevent heart disease and stroke:  Have your blood pressure checked at least every 1-2 years. High blood pressure causes heart disease and increases the risk of stroke.  If you are 14-52 years old, ask your health care provider if you should take aspirin to prevent a heart attack or a stroke.  Do not use any tobacco products, including cigarettes, chewing tobacco, or electronic cigarettes. If you need help quitting, ask your health care provider.  It is important to eat a healthy diet and maintain a healthy weight.  Be sure to include plenty of vegetables, fruits, low-fat dairy products, and lean protein.  Avoid eating foods that are high in solid fats, added sugars, or salt (sodium).  Get regular exercise. This is one of the most important things that you can do for your health.  Try to exercise for at least 150 minutes each week. The type of exercise that you do should increase your heart rate and make you sweat. This is known as moderate-intensity exercise.  Try to do strengthening exercises at least twice each week. Do these in addition to the moderate-intensity exercise.  Know your numbers.Ask your health care provider to check your cholesterol and your blood glucose. Continue to have your blood tested as directed by your health care provider. What should I know about cancer screening? There are several types of cancer. Take the following steps to reduce your  risk and to catch any cancer development as early as possible. Breast Cancer  Practice breast self-awareness.  This means understanding how your breasts normally appear and feel.  It also means doing regular breast self-exams. Let  your health care provider know about any changes, no matter how small.  If you are 53 or older, have a clinician do a breast exam (clinical breast exam or CBE) every year. Depending on your age, family history, and medical history, it may be recommended that you also have a yearly breast X-ray (mammogram).  If you have a family history of breast cancer, talk with your health care provider about genetic screening.  If you are at high risk for breast cancer, talk with your health care provider about having an MRI and a mammogram every year.  Breast cancer (BRCA) gene test is recommended for women who have family members with BRCA-related cancers. Results of the assessment will determine the need for genetic counseling and BRCA1 and for BRCA2 testing. BRCA-related cancers include these types:  Breast. This occurs in males or females.  Ovarian.  Tubal. This may also be called fallopian tube cancer.  Cancer of the abdominal or pelvic lining (peritoneal cancer).  Prostate.  Pancreatic. Cervical, Uterine, and Ovarian Cancer  Your health care provider may recommend that you be screened regularly for cancer of the pelvic organs. These include your ovaries, uterus, and vagina. This screening involves a pelvic exam, which includes checking for microscopic changes to the surface of your cervix (Pap test).  For women ages 21-65, health care providers may recommend a pelvic exam and a Pap test every three years. For women ages 65-65, they may recommend the Pap test and pelvic exam, combined with testing for human papilloma virus (HPV), every five years. Some types of HPV increase your risk of cervical cancer. Testing for HPV may also be done on women of any age who have unclear Pap test results.  Other health care providers may not recommend any screening for nonpregnant women who are considered low risk for pelvic cancer and have no symptoms. Ask your health care provider if a screening pelvic exam is  right for you.  If you have had past treatment for cervical cancer or a condition that could lead to cancer, you need Pap tests and screening for cancer for at least 20 years after your treatment. If Pap tests have been discontinued for you, your risk factors (such as having a new sexual partner) need to be reassessed to determine if you should start having screenings again. Some women have medical problems that increase the chance of getting cervical cancer. In these cases, your health care provider may recommend that you have screening and Pap tests more often.  If you have a family history of uterine cancer or ovarian cancer, talk with your health care provider about genetic screening.  If you have vaginal bleeding after reaching menopause, tell your health care provider.  There are currently no reliable tests available to screen for ovarian cancer. Lung Cancer  Lung cancer screening is recommended for adults 33-33 years old who are at high risk for lung cancer because of a history of smoking. A yearly low-dose CT scan of the lungs is recommended if you:  Currently smoke.  Have a history of at least 30 pack-years of smoking and you currently smoke or have quit within the past 15 years. A pack-year is smoking an average of one pack of cigarettes per day for one year. Yearly screening  should:  Continue until it has been 15 years since you quit.  Stop if you develop a health problem that would prevent you from having lung cancer treatment. Colorectal Cancer  This type of cancer can be detected and can often be prevented.  Routine colorectal cancer screening usually begins at age 79 and continues through age 14.  If you have risk factors for colon cancer, your health care provider may recommend that you be screened at an earlier age.  If you have a family history of colorectal cancer, talk with your health care provider about genetic screening.  Your health care provider may also  recommend using home test kits to check for hidden blood in your stool.  A small camera at the end of a tube can be used to examine your colon directly (sigmoidoscopy or colonoscopy). This is done to check for the earliest forms of colorectal cancer.  Direct examination of the colon should be repeated every 5-10 years until age 34. However, if early forms of precancerous polyps or small growths are found or if you have a family history or genetic risk for colorectal cancer, you may need to be screened more often. Skin Cancer  Check your skin from head to toe regularly.  Monitor any moles. Be sure to tell your health care provider:  About any new moles or changes in moles, especially if there is a change in a mole's shape or color.  If you have a mole that is larger than the size of a pencil eraser.  If any of your family members has a history of skin cancer, especially at a young age, talk with your health care provider about genetic screening.  Always use sunscreen. Apply sunscreen liberally and repeatedly throughout the day.  Whenever you are outside, protect yourself by wearing long sleeves, pants, a wide-brimmed hat, and sunglasses. What should I know about osteoporosis? Osteoporosis is a condition in which bone destruction happens more quickly than new bone creation. After menopause, you may be at an increased risk for osteoporosis. To help prevent osteoporosis or the bone fractures that can happen because of osteoporosis, the following is recommended:  If you are 15-23 years old, get at least 1,000 mg of calcium and at least 600 mg of vitamin D per day.  If you are older than age 55 but younger than age 42, get at least 1,200 mg of calcium and at least 600 mg of vitamin D per day.  If you are older than age 59, get at least 1,200 mg of calcium and at least 800 mg of vitamin D per day. Smoking and excessive alcohol intake increase the risk of osteoporosis. Eat foods that are rich in  calcium and vitamin D, and do weight-bearing exercises several times each week as directed by your health care provider. What should I know about how menopause affects my mental health? Depression may occur at any age, but it is more common as you become older. Common symptoms of depression include:  Low or sad mood.  Changes in sleep patterns.  Changes in appetite or eating patterns.  Feeling an overall lack of motivation or enjoyment of activities that you previously enjoyed.  Frequent crying spells. Talk with your health care provider if you think that you are experiencing depression. What should I know about immunizations? It is important that you get and maintain your immunizations. These include:  Tetanus, diphtheria, and pertussis (Tdap) booster vaccine.  Influenza every year before the flu season begins.  Pneumonia vaccine.  Shingles vaccine. Your health care provider may also recommend other immunizations. This information is not intended to replace advice given to you by your health care provider. Make sure you discuss any questions you have with your health care provider. Document Released: 12/23/2005 Document Revised: 05/20/2016 Document Reviewed: 08/04/2015  2017 Elsevier

## 2017-01-09 ENCOUNTER — Ambulatory Visit: Payer: 59 | Admitting: Physical Therapy

## 2017-01-09 ENCOUNTER — Encounter: Payer: Self-pay | Admitting: Physical Therapy

## 2017-01-09 DIAGNOSIS — M7062 Trochanteric bursitis, left hip: Secondary | ICD-10-CM | POA: Diagnosis not present

## 2017-01-09 DIAGNOSIS — M7061 Trochanteric bursitis, right hip: Secondary | ICD-10-CM

## 2017-01-09 DIAGNOSIS — M25552 Pain in left hip: Secondary | ICD-10-CM

## 2017-01-09 NOTE — Therapy (Signed)
[place] [facility] SERVICES [address] [place], [place], [address] Phone: [phone]   Fax:  [phone]  Physical Therapy Treatment  Patient Details  Name: NOEMIE STUFFLE MRN: [id number] Date of Birth: 1956-02-08 Referring Provider: Sharlet Salina  Encounter Date: 01/09/2017      PT End of Session - 01/09/17 1808    Visit Number 2   Number of Visits 17   Date for PT Re-Evaluation 02/28/17   PT Start Time 0545   PT Stop Time 0625   PT Time Calculation (min) 40 min   Activity Tolerance Patient tolerated treatment well   Behavior During Therapy [facility] for tasks assessed/performed      Past Medical History:  Diagnosis Date  . Anxiety   . Apnea   . Diverticulitis   . Environmental allergies   . Hypertension   . Menopausal state   . Obesity   . PMB (postmenopausal bleeding)   . PVD (peripheral vascular disease) ([place])   . Rosacea   . Simple endometrial hyperplasia   . Thyroid disease   . Urinary urgency     Past Surgical History:  Procedure Laterality Date  . CHOLECYSTECTOMY    . DILATION AND CURETTAGE OF UTERUS    . HYSTEROSCOPY      There were no vitals filed for this visit.      Subjective Assessment - 01/09/17 1749    Subjective Patient is working at [employer] for the weekend and she is standing and walking and on her feet all weekend. She is having 4/10 left hip.    Pertinent History Patient's B hip pain begain in September 2016 and she had shots in both hips recently for trochanteric bursitis. She states the shots worked well on the R hip, but not as well on the L hip.   Limitations Standing;Walking   How long can you sit comfortably? 10 minutes   How long can you stand comfortably? unlimited   How long can you walk comfortably? 45 minutes   Diagnostic tests MRI, xray of lumbar spine   Patient Stated Goals wants to be able to know what she can safely do   Currently in Pain? Yes   Pain Score 4    Pain Location  Hip   Pain Orientation Left   Pain Descriptors / Indicators Throbbing   Pain Type Chronic pain   Pain Onset More than a month ago   Pain Frequency Intermittent   Aggravating Factors  walking and lying on the right and left side   Multiple Pain Sites No      Manual therapy: STM to left lateral hip with roller stick  [place] at 1. 5 cm squared continuous x 10 minutes to lateral left hip Patient has 4/10 pain with ambulation and 10/10 pain with palpation prior to treatment and 0/10 following.                            PT Education - 01/09/17 1751    Education provided (P)  Yes   Education Details (P)  HEP   Person(s) Educated (P)  Patient   Methods (P)  Explanation   Comprehension (P)  Verbalized understanding             PT Long Term Goals - 01/03/17 1808      PT LONG TERM GOAL #1   Title Patient will be independent in home exercise program to improve strength/mobility for  better functional independence with ADLs.   Time 8   Period Weeks   Status New     PT LONG TERM GOAL #2   Title Patient will increase BLE gross strength to 4+/5 as to improve functional strength for independent gait, increased standing tolerance and increased ADL ability.   Baseline 3+/5   Time 8   Period Weeks   Status New     PT LONG TERM GOAL #3   Title Patient will report a worst pain of 3/10 on VAS in B hips to improve tolerance with ADLs and reduced symptoms with activities.    Baseline 6/10   Time 8   Period Weeks   Status New     PT LONG TERM GOAL #4   Title Patient will increase lower extremity functional scale to >60/80 to demonstrate improved functional mobility and increased tolerance with ADLs.    Baseline 40   Time 8   Period Weeks   Status New               Plan - 01/09/17 1808    Clinical Impression Statement Patient continues to have irritable and tender lateral left hip that is 10/10 pain with palpation and 4/10 pain during ambulation. She  responds to [place] to left hip and manual therapy to left hip STM with roller stick. She will continue to benefit from skilled PT to decrease pain and improve mobiltiy.    Rehab Potential Fair   Clinical Impairments Affecting Rehab Potential This patient presents with 1 personal factor: current experience, and 3 body elements including body structures and functions, activity limitations and/or participation restrictions including decreased strength, pain, and participations restrictions such as inability to participate in life situations.  Patient's condition is evolving.   PT Frequency 2x / week   PT Duration 8 weeks   PT Treatment/Interventions ADLs/Self Care Home Management;Aquatic Therapy;Cryotherapy;Electrical Stimulation;Iontophoresis 4mg /ml Dexamethasone;Stair training;Gait training;Ultrasound;Moist Heat;Therapeutic activities;Therapeutic exercise;Patient/family education;Passive range of motion;Manual techniques;Dry needling;Taping   PT Next Visit Plan strengthening   PT Home Exercise Plan hip flexion and extension with red theraband   Consulted and Agree with Plan of Care Patient      Patient will benefit from skilled therapeutic intervention in order to improve the following deficits and impairments:  Decreased balance, Abnormal gait, Decreased endurance, Decreased coordination, Decreased mobility, Decreased strength, Decreased range of motion, Difficulty walking, Hypomobility, Impaired flexibility, Increased fascial restricitons, Pain  Visit Diagnosis: Trochanteric bursitis of both hips  Pain in left hip     Problem List Patient Active Problem List   Diagnosis Date Noted  . Endometrial hyperplasia without atypia, simple 07/21/2015  . Diverticulosis 07/21/2015  . Neuritis or radiculitis due to rupture of lumbar intervertebral disc 11/12/2014  . Lumbar canal stenosis 11/12/2014  . Bursitis, trochanteric 11/12/2014  . Back ache 04/10/2014  . Chronic anemia 04/10/2014  . BP (high  blood pressure) 04/10/2014  . Blood glucose elevated 04/10/2014  . HLD (hyperlipidemia) 04/10/2014  . Adult hypothyroidism 04/10/2014  . Adiposity 04/10/2014  . Obstructive apnea 04/10/2014   Alanson Puls, PT, DPT [place], Minette Headland S 01/09/2017, 6:30 PM  [place] [facility] SERVICES [address] [place], [place], [address] Phone: [phone]   Fax:  [phone]  Name: ANTIGONE HARROLD MRN: [id number] Date of Birth: Apr 13, 1956

## 2017-01-10 ENCOUNTER — Encounter: Payer: 59 | Admitting: Obstetrics and Gynecology

## 2017-01-10 LAB — PAP IG AND HPV HIGH-RISK
HPV, HIGH-RISK: NEGATIVE
PAP Smear Comment: 0

## 2017-01-12 ENCOUNTER — Encounter: Payer: Self-pay | Admitting: Physical Therapy

## 2017-01-12 ENCOUNTER — Ambulatory Visit: Payer: 59 | Attending: Physical Medicine and Rehabilitation | Admitting: Physical Therapy

## 2017-01-12 DIAGNOSIS — M7062 Trochanteric bursitis, left hip: Secondary | ICD-10-CM | POA: Insufficient documentation

## 2017-01-12 DIAGNOSIS — Z1211 Encounter for screening for malignant neoplasm of colon: Secondary | ICD-10-CM | POA: Diagnosis not present

## 2017-01-12 DIAGNOSIS — M7061 Trochanteric bursitis, right hip: Secondary | ICD-10-CM | POA: Diagnosis not present

## 2017-01-12 DIAGNOSIS — M25552 Pain in left hip: Secondary | ICD-10-CM | POA: Diagnosis not present

## 2017-01-12 NOTE — Therapy (Signed)
[place] [facility] SERVICES [address] [place], [place], [address] Phone: [phone]   Fax:  [phone]  Physical Therapy Treatment  Patient Details  Name: Rebekah Perkins MRN: [id number] Date of Birth: 04-Feb-1956 Referring Provider: Sharlet Salina  Encounter Date: 01/12/2017      PT End of Session - 01/12/17 0910    Visit Number 3   Number of Visits 17   Date for PT Re-Evaluation 02/28/17   PT Start Time 0832   PT Stop Time 0910   PT Time Calculation (min) 38 min   Activity Tolerance Patient tolerated treatment well   Behavior During Therapy [facility] for tasks assessed/performed      Past Medical History:  Diagnosis Date  . Anxiety   . Apnea   . Diverticulitis   . Environmental allergies   . Hypertension   . Menopausal state   . Obesity   . PMB (postmenopausal bleeding)   . PVD (peripheral vascular disease) ([place])   . Rosacea   . Simple endometrial hyperplasia   . Thyroid disease   . Urinary urgency     Past Surgical History:  Procedure Laterality Date  . CHOLECYSTECTOMY    . DILATION AND CURETTAGE OF UTERUS    . HYSTEROSCOPY      There were no vitals filed for this visit.      Subjective Assessment - 01/12/17 0908    Subjective Patient is having less pain and discomfort today and thinks that the PT  treatment is helping.    Pertinent History Patient's B hip pain begain in September 2016 and she had shots in both hips recently for trochanteric bursitis. She states the shots worked well on the R hip, but not as well on the L hip.   Limitations Standing;Walking   How long can you sit comfortably? 10 minutes   How long can you stand comfortably? unlimited   How long can you walk comfortably? 45 minutes   Diagnostic tests MRI, xray of lumbar spine   Patient Stated Goals wants to be able to know what she can safely do   Currently in Pain? Yes   Pain Score 1    Pain Location Hip   Pain Orientation Left   Pain  Descriptors / Indicators Dull   Pain Type Chronic pain   Pain Onset More than a month ago   Pain Frequency Intermittent   Aggravating Factors  walking and ldying on the right side and left side   Pain Relieving Factors sitting   Effect of Pain on Daily Activities unable to do housework   Multiple Pain Sites No         Manual therapy: STM to left lateral hip with roller stick  [place] at 1. 5 cm squared continuous x 10 minutes to lateral left hip Therapeutic exercise; Standing hip add BLE x 10 x 2  Patient has 1/10 pain with ambulation and 1/10 pain with palpation prior to treatment and 0/10 following.                         PT Education - 01/12/17 0910    Education provided Yes   Education Details hip add exercise standing and ice   Person(s) Educated Patient   Methods Explanation   Comprehension Verbalized understanding             PT Long Term Goals - 01/03/17 1808      PT LONG TERM GOAL #  1   Title Patient will be independent in home exercise program to improve strength/mobility for better functional independence with ADLs.   Time 8   Period Weeks   Status New     PT LONG TERM GOAL #2   Title Patient will increase BLE gross strength to 4+/5 as to improve functional strength for independent gait, increased standing tolerance and increased ADL ability.   Baseline 3+/5   Time 8   Period Weeks   Status New     PT LONG TERM GOAL #3   Title Patient will report a worst pain of 3/10 on VAS in B hips to improve tolerance with ADLs and reduced symptoms with activities.    Baseline 6/10   Time 8   Period Weeks   Status New     PT LONG TERM GOAL #4   Title Patient will increase lower extremity functional scale to >60/80 to demonstrate improved functional mobility and increased tolerance with ADLs.    Baseline 40   Time 8   Period Weeks   Status New               Plan - 01/12/17 0911    Clinical Impression Statement Patient is having  less pain in left hip and reports a discofort of 1/10 and has less tenderness with palpation. she responds to [place] and manual therapy  to Left hip with no pain after therapy. She will continue to benefit from PT to begin a strengthening program and continue to reduce her hip pain.    Rehab Potential Fair   Clinical Impairments Affecting Rehab Potential This patient presents with 1 personal factor: current experience, and 3 body elements including body structures and functions, activity limitations and/or participation restrictions including decreased strength, pain, and participations restrictions such as inability to participate in life situations.  Patient's condition is evolving.   PT Frequency 2x / week   PT Duration 8 weeks   PT Treatment/Interventions ADLs/Self Care Home Management;Aquatic Therapy;Cryotherapy;Electrical Stimulation;Iontophoresis 4mg /ml Dexamethasone;Stair training;Gait training;Ultrasound;Moist Heat;Therapeutic activities;Therapeutic exercise;Patient/family education;Passive range of motion;Manual techniques;Dry needling;Taping   PT Next Visit Plan strengthening   PT Home Exercise Plan hip flexion and extension with red theraband   Consulted and Agree with Plan of Care Patient      Patient will benefit from skilled therapeutic intervention in order to improve the following deficits and impairments:  Decreased balance, Abnormal gait, Decreased endurance, Decreased coordination, Decreased mobility, Decreased strength, Decreased range of motion, Difficulty walking, Hypomobility, Impaired flexibility, Increased fascial restricitons, Pain  Visit Diagnosis: Trochanteric bursitis of both hips  Pain in left hip     Problem List Patient Active Problem List   Diagnosis Date Noted  . Endometrial hyperplasia without atypia, simple 07/21/2015  . Diverticulosis 07/21/2015  . Neuritis or radiculitis due to rupture of lumbar intervertebral disc 11/12/2014  . Lumbar canal stenosis  11/12/2014  . Bursitis, trochanteric 11/12/2014  . Back ache 04/10/2014  . Chronic anemia 04/10/2014  . BP (high blood pressure) 04/10/2014  . Blood glucose elevated 04/10/2014  . HLD (hyperlipidemia) 04/10/2014  . Adult hypothyroidism 04/10/2014  . Adiposity 04/10/2014  . Obstructive apnea 04/10/2014   Alanson Puls, PT, DPT [place], Minette Headland S 01/12/2017, 9:15 AM  [place] [facility] SERVICES [address] [place], [place], [address] Phone: [phone]   Fax:  [phone]  Name: JOPLYN TATAR MRN: [id number] Date of Birth: 02/27/1956

## 2017-01-17 ENCOUNTER — Ambulatory Visit: Payer: 59 | Admitting: Physical Therapy

## 2017-01-17 ENCOUNTER — Encounter: Payer: Self-pay | Admitting: Physical Therapy

## 2017-01-17 VITALS — BP 122/63 | HR 62

## 2017-01-17 DIAGNOSIS — M25552 Pain in left hip: Secondary | ICD-10-CM | POA: Diagnosis not present

## 2017-01-17 DIAGNOSIS — M7061 Trochanteric bursitis, right hip: Secondary | ICD-10-CM | POA: Diagnosis not present

## 2017-01-17 DIAGNOSIS — M7062 Trochanteric bursitis, left hip: Principal | ICD-10-CM

## 2017-01-17 NOTE — Therapy (Signed)
[place] MAIN [facility] SERVICES [address] [place], [place], [address] Phone: [phone]   Fax:  [phone]  Physical Therapy Treatment  Patient Details  Name: Rebekah Perkins MRN: [id number] Date of Birth: 1956-04-06 Referring Provider: Sharlet Salina  Encounter Date: 01/17/2017      PT End of Session - 01/17/17 1714    Visit Number 4   Number of Visits 17   Date for PT Re-Evaluation 02/28/17   PT Start Time 1708   PT Stop Time 1751   PT Time Calculation (min) 43 min   Activity Tolerance Patient tolerated treatment well   Behavior During Therapy [facility] for tasks assessed/performed      Past Medical History:  Diagnosis Date  . Anxiety   . Apnea   . Diverticulitis   . Environmental allergies   . Hypertension   . Menopausal state   . Obesity   . PMB (postmenopausal bleeding)   . PVD (peripheral vascular disease) ([place])   . Rosacea   . Simple endometrial hyperplasia   . Thyroid disease   . Urinary urgency     Past Surgical History:  Procedure Laterality Date  . CHOLECYSTECTOMY    . DILATION AND CURETTAGE OF UTERUS    . HYSTEROSCOPY      Vitals:   01/17/17 1712  BP: 122/63  Pulse: 62  SpO2: 100%        Subjective Assessment - 01/17/17 1712    Subjective Pt reports she had increased pain after last session following hip abduction exercise in standing.  She would like to stick to exercises on the mat table today.  Pt altered her HEP to be completed lying in bed where she was more comfortable.    Pertinent History Patient's B hip pain begain in September 2016 and she had shots in both hips recently for trochanteric bursitis. She states the shots worked well on the R hip, but not as well on the L hip.   Limitations Standing;Walking   How long can you sit comfortably? 10 minutes   How long can you stand comfortably? unlimited   How long can you walk comfortably? 45 minutes   Diagnostic tests MRI, xray of lumbar spine   Patient Stated Goals wants to be able to know what she can safely do   Currently in Pain? No/denies   Pain Score 0-No pain       TREATMENT   Therapeutic Exercise:  Bil isometric clamshell with blue belt just above knees with 5 second holds. 2x10  Hooklying Bil hip abd/ER with GTB just above knees 3x15  Hooklying pelvic tilts with verbal and tactile cues for proper core activation 3x10  Bridges with cues for glute and core activation. Pt reports fatigue in glutes with this exercise and denies any back pain during exercise. 2x10  Hooklying marching 1x30 each LE  Pt denies pain throughout session.             PT Education - 01/17/17 1714    Education provided Yes   Education Details Exercise technique; Clinical reasoning behind therapeutic exercises    Person(s) Educated Patient   Methods Explanation;Demonstration   Comprehension Verbalized understanding;Returned demonstration;Need further instruction             PT Long Term Goals - 01/03/17 1808      PT LONG TERM GOAL #1   Title Patient will be independent in home exercise program to improve strength/mobility for better functional independence with ADLs.  Time 8   Period Weeks   Status New     PT LONG TERM GOAL #2   Title Patient will increase BLE gross strength to 4+/5 as to improve functional strength for independent gait, increased standing tolerance and increased ADL ability.   Baseline 3+/5   Time 8   Period Weeks   Status New     PT LONG TERM GOAL #3   Title Patient will report a worst pain of 3/10 on VAS in B hips to improve tolerance with ADLs and reduced symptoms with activities.    Baseline 6/10   Time 8   Period Weeks   Status New     PT LONG TERM GOAL #4   Title Patient will increase lower extremity functional scale to >60/80 to demonstrate improved functional mobility and increased tolerance with ADLs.    Baseline 40   Time 8   Period Weeks   Status New               Plan -  01/17/17 1720    Clinical Impression Statement Pt demonstrates weakness in Bil glutes as evidenced by pt's report of burning in the glute region with hip abduction mat exercises.  Pt required verbal and tactile cues for proper core activation with introduction of posterior pelvic tilts today.  Pt tolerated all interventions well this session and will benefit from continued skilled PT interventions for improved strengthening and QOL and decreased pain.   Rehab Potential Fair   Clinical Impairments Affecting Rehab Potential This patient presents with 1 personal factor: current experience, and 3 body elements including body structures and functions, activity limitations and/or participation restrictions including decreased strength, pain, and participations restrictions such as inability to participate in life situations.  Patient's condition is evolving.   PT Frequency 2x / week   PT Duration 8 weeks   PT Treatment/Interventions ADLs/Self Care Home Management;Aquatic Therapy;Cryotherapy;Electrical Stimulation;Iontophoresis 4mg /ml Dexamethasone;Stair training;Gait training;Ultrasound;Moist Heat;Therapeutic activities;Therapeutic exercise;Patient/family education;Passive range of motion;Manual techniques;Dry needling;Taping   PT Next Visit Plan strengthening   PT Home Exercise Plan hip flexion and extension with red theraband   Consulted and Agree with Plan of Care Patient      Patient will benefit from skilled therapeutic intervention in order to improve the following deficits and impairments:  Decreased balance, Abnormal gait, Decreased endurance, Decreased coordination, Decreased mobility, Decreased strength, Decreased range of motion, Difficulty walking, Hypomobility, Impaired flexibility, Increased fascial restricitons, Pain  Visit Diagnosis: Trochanteric bursitis of both hips  Pain in left hip     Problem List Patient Active Problem List   Diagnosis Date Noted  . Endometrial hyperplasia  without atypia, simple 07/21/2015  . Diverticulosis 07/21/2015  . Neuritis or radiculitis due to rupture of lumbar intervertebral disc 11/12/2014  . Lumbar canal stenosis 11/12/2014  . Bursitis, trochanteric 11/12/2014  . Back ache 04/10/2014  . Chronic anemia 04/10/2014  . BP (high blood pressure) 04/10/2014  . Blood glucose elevated 04/10/2014  . HLD (hyperlipidemia) 04/10/2014  . Adult hypothyroidism 04/10/2014  . Adiposity 04/10/2014  . Obstructive apnea 04/10/2014    Collie Siad PT, DPT 01/17/2017, 5:55 PM  [place] MAIN [facility] SERVICES [address] [place], [place], [address] Phone: [phone]   Fax:  [phone]  Name: NINEL HOSKIE MRN: [id number] Date of Birth: October 28, 1956

## 2017-01-19 ENCOUNTER — Encounter: Payer: Self-pay | Admitting: Physical Therapy

## 2017-01-19 ENCOUNTER — Ambulatory Visit: Payer: 59 | Admitting: Physical Therapy

## 2017-01-19 DIAGNOSIS — M25552 Pain in left hip: Secondary | ICD-10-CM

## 2017-01-19 DIAGNOSIS — M7062 Trochanteric bursitis, left hip: Secondary | ICD-10-CM | POA: Diagnosis not present

## 2017-01-19 DIAGNOSIS — M7061 Trochanteric bursitis, right hip: Secondary | ICD-10-CM | POA: Diagnosis not present

## 2017-01-19 NOTE — Therapy (Signed)
[place] MAIN [facility] SERVICES [address] [place], [place], [address] Phone: [phone]   Fax:  [phone]  Physical Therapy Treatment  Patient Details  Name: Rebekah Perkins MRN: [id number] Date of Birth: 1955-11-16 Referring Provider: Sharlet Salina  Encounter Date: 01/19/2017      PT End of Session - 01/19/17 1733    Visit Number 5   Number of Visits 17   Date for PT Re-Evaluation 02/28/17   PT Start Time 3149   PT Stop Time 7026   PT Time Calculation (min) 43 min   Activity Tolerance Patient tolerated treatment well;No increased pain   Behavior During Therapy WFL for tasks assessed/performed      Past Medical History:  Diagnosis Date  . Anxiety   . Apnea   . Diverticulitis   . Environmental allergies   . Hypertension   . Menopausal state   . Obesity   . PMB (postmenopausal bleeding)   . PVD (peripheral vascular disease) ([place])   . Rosacea   . Simple endometrial hyperplasia   . Thyroid disease   . Urinary urgency     Past Surgical History:  Procedure Laterality Date  . CHOLECYSTECTOMY    . DILATION AND CURETTAGE OF UTERUS    . HYSTEROSCOPY      There were no vitals filed for this visit.      Subjective Assessment - 01/19/17 1733    Subjective Patient reports compliance with HEP; She reports having a little stiffness after doing the exercise but no significant pain;    Pertinent History Patient's B hip pain begain in September 2016 and she had shots in both hips recently for trochanteric bursitis. She states the shots worked well on the R hip, but not as well on the L hip.   Limitations Standing;Walking   How long can you sit comfortably? 10 minutes   How long can you stand comfortably? unlimited   How long can you walk comfortably? 45 minutes   Diagnostic tests MRI, xray of lumbar spine   Patient Stated Goals wants to be able to know what she can safely do   Currently in Pain? Yes   Pain Score 2    Pain  Location Hip   Pain Orientation Left;Lateral   Pain Descriptors / Indicators Tightness   Pain Type Chronic pain   Pain Onset More than a month ago   Pain Frequency Intermittent   Aggravating Factors  lying on right side;           TREATMENT  Warm up on Nustep BUE/BLE level 2 x5 min (unbilled)  Standing  IT band stretch left side 15 sec hold x2 with min VCs for positioning for optimum stretches;   Seated modified piriformis stretch 15 sec hold x2 LLE only, required cues for positioning for best stretch; Patient unable to cross ankle over knee;   Hooklying: Modified left IT band stretch 15 sec hold x3 with cues to avoid trunk rotation for better hip stretch; Bridges with cues for glute and core activation 2x15 ;  Hooklying hip adduction ball squeeze 2x10 with cues to avoid painful ROM; Lumbar trunk rotation x2 min with cues to avoid painful ROM;   Passive LLE knee to chest stretch 15 sec hold x2; Passive LLE Piriformis and modified piriformis stretch 15 sec hold x2 each;   Patient provided with written Handout of stretches to improve tissue extensibility; She reports less stiffness and less pain at end of treatment session;  PT Education - 01/19/17 1733    Education provided Yes   Education Details HEP reinforced, strengthening,    Person(s) Educated Patient   Methods Explanation;Verbal cues   Comprehension Verbalized understanding;Returned demonstration;Verbal cues required             PT Long Term Goals - 01/03/17 1808      PT LONG TERM GOAL #1   Title Patient will be independent in home exercise program to improve strength/mobility for better functional independence with ADLs.   Time 8   Period Weeks   Status New     PT LONG TERM GOAL #2   Title Patient will increase BLE gross strength to 4+/5 as to improve functional strength for independent gait, increased standing tolerance and increased ADL ability.   Baseline  3+/5   Time 8   Period Weeks   Status New     PT LONG TERM GOAL #3   Title Patient will report a worst pain of 3/10 on VAS in B hips to improve tolerance with ADLs and reduced symptoms with activities.    Baseline 6/10   Time 8   Period Weeks   Status New     PT LONG TERM GOAL #4   Title Patient will increase lower extremity functional scale to >60/80 to demonstrate improved functional mobility and increased tolerance with ADLs.    Baseline 40   Time 8   Period Weeks   Status New               Plan - 01/19/17 1829    Clinical Impression Statement Patient continues to have increased pain and tightness along left hip; She reports trying to do more of the exercise but reports that she is just really sore. Instructed patient in lateral hip stretches to reduce tightness. Also instructed pateint in LE hip strengthening; She denies any increase in pain with exercise. She would benefit from additional skilled PT Intervention to improve strength, ROM and reduce pain with ADLs;    Rehab Potential Fair   Clinical Impairments Affecting Rehab Potential This patient presents with 1 personal factor: current experience, and 3 body elements including body structures and functions, activity limitations and/or participation restrictions including decreased strength, pain, and participations restrictions such as inability to participate in life situations.  Patient's condition is evolving.   PT Frequency 2x / week   PT Duration 8 weeks   PT Treatment/Interventions ADLs/Self Care Home Management;Aquatic Therapy;Cryotherapy;Electrical Stimulation;Iontophoresis 4mg /ml Dexamethasone;Stair training;Gait training;Ultrasound;Moist Heat;Therapeutic activities;Therapeutic exercise;Patient/family education;Passive range of motion;Manual techniques;Dry needling;Taping   PT Next Visit Plan strengthening   PT Home Exercise Plan advanced- see patient instructions;    Consulted and Agree with Plan of Care Patient       Patient will benefit from skilled therapeutic intervention in order to improve the following deficits and impairments:  Decreased balance, Abnormal gait, Decreased endurance, Decreased coordination, Decreased mobility, Decreased strength, Decreased range of motion, Difficulty walking, Hypomobility, Impaired flexibility, Increased fascial restricitons, Pain  Visit Diagnosis: Trochanteric bursitis of both hips  Pain in left hip     Problem List Patient Active Problem List   Diagnosis Date Noted  . Endometrial hyperplasia without atypia, simple 07/21/2015  . Diverticulosis 07/21/2015  . Neuritis or radiculitis due to rupture of lumbar intervertebral disc 11/12/2014  . Lumbar canal stenosis 11/12/2014  . Bursitis, trochanteric 11/12/2014  . Back ache 04/10/2014  . Chronic anemia 04/10/2014  . BP (high blood pressure) 04/10/2014  . Blood glucose elevated 04/10/2014  .  HLD (hyperlipidemia) 04/10/2014  . Adult hypothyroidism 04/10/2014  . Adiposity 04/10/2014  . Obstructive apnea 04/10/2014    Trotter,Margaret PT, DPT 01/19/2017, 6:36 PM  [place] MAIN [facility] SERVICES [address] [place], [place], [address] Phone: [phone]   Fax:  [phone]  Name: Rebekah Perkins MRN: [id number] Date of Birth: 14-May-1956

## 2017-01-19 NOTE — Patient Instructions (Addendum)
Lateral Trunk / IT Band Stretch    Stand beside counter, cross outer leg in front of inside leg, then lean towards wall until stretch is felt in outer thigh.  Hold _15__ seconds. Do __2_ sets of _2__ repetitions.  Copyright  VHI. All rights reserved.  Piriformis Stretch, Sitting    Sit, have one leg out straight, try to cross other leg ankle over your leg and relax your knee into a figure "4" position.  Hold 15___ seconds.  Repeat __2_ times per session. Do _2__ sessions per day.  Copyright  VHI. All rights reserved.     Lower Trunk Rotation Stretch  Lying on back with knees bent, Keeping back flat and feet together, rotate knees side to side slowly and in pain free range of motion.  Hold _2___ seconds. Repeat for 1-2 minutes. Do __1__ sets per session. Do __2-3__ sessions per day.  http://orth.exer.us/122    Stretching: Piriformis (Supine)    Pull right knee toward opposite shoulder. Hold _15___ seconds. Relax. Repeat __2__ times per set. Do ___2_ sets per session. Do _2___ sessions per day.  http://orth.exer.us/713   Copyright  VHI. All rights reserved.  Bridge    Lie back, legs bent. Inhale, pressing hips up. Keeping ribs in, lengthen lower back. Exhale, rolling down along spine from top. Repeat ___15_ times. Do _2___ sessions per day.  http://pm.exer.us/55   Copyright  VHI. All rights reserved.

## 2017-01-22 LAB — FECAL OCCULT BLOOD, IMMUNOCHEMICAL: FECAL OCCULT BLD: NEGATIVE

## 2017-01-23 ENCOUNTER — Ambulatory Visit: Payer: 59 | Admitting: Physical Therapy

## 2017-01-25 ENCOUNTER — Encounter: Payer: Self-pay | Admitting: Physical Therapy

## 2017-01-25 ENCOUNTER — Ambulatory Visit: Payer: 59 | Admitting: Physical Therapy

## 2017-01-25 DIAGNOSIS — M7061 Trochanteric bursitis, right hip: Secondary | ICD-10-CM | POA: Diagnosis not present

## 2017-01-25 DIAGNOSIS — M7062 Trochanteric bursitis, left hip: Principal | ICD-10-CM

## 2017-01-25 DIAGNOSIS — M25552 Pain in left hip: Secondary | ICD-10-CM

## 2017-01-25 NOTE — Therapy (Signed)
[place] MAIN [facility] SERVICES [address] [place], [place], [address] Phone: [phone]   Fax:  [phone]  Physical Therapy Treatment  Patient Details  Name: Rebekah Perkins MRN: [id number] Date of Birth: 09-Mar-1956 Referring Provider: Sharlet Salina  Encounter Date: 01/25/2017      PT End of Session - 01/25/17 1338    Visit Number 6   Number of Visits 17   Date for PT Re-Evaluation 02/28/17   PT Start Time 0100   PT Stop Time 0138   PT Time Calculation (min) 38 min   Activity Tolerance Patient tolerated treatment well;No increased pain   Behavior During Therapy WFL for tasks assessed/performed      Past Medical History:  Diagnosis Date  . Anxiety   . Apnea   . Diverticulitis   . Environmental allergies   . Hypertension   . Menopausal state   . Obesity   . PMB (postmenopausal bleeding)   . PVD (peripheral vascular disease) ([place])   . Rosacea   . Simple endometrial hyperplasia   . Thyroid disease   . Urinary urgency     Past Surgical History:  Procedure Laterality Date  . CHOLECYSTECTOMY    . DILATION AND CURETTAGE OF UTERUS    . HYSTEROSCOPY      There were no vitals filed for this visit.      Subjective Assessment - 01/25/17 1336    Subjective Patient reports compliance with HEP except she thinks that she needs to do it on a harder surface.    Pertinent History Patient's B hip pain begain in September 2016 and she had shots in both hips recently for trochanteric bursitis. She states the shots worked well on the R hip, but not as well on the L hip.   Limitations Standing;Walking   How long can you sit comfortably? 10 minutes   How long can you stand comfortably? unlimited   How long can you walk comfortably? 45 minutes   Diagnostic tests MRI, xray of lumbar spine   Patient Stated Goals wants to be able to know what she can safely do   Currently in Pain? Yes   Pain Score 2    Pain Location Hip   Pain  Orientation Left;Lateral   Pain Descriptors / Indicators Tightness   Pain Type Chronic pain   Pain Onset More than a month ago   Pain Frequency Intermittent   Aggravating Factors  lying on the right side, hip abd exercises   Pain Relieving Factors sitting   Effect of Pain on Daily Activities unable to stand as long   Multiple Pain Sites No      Treatment: [place] to lateral left hip at 1. 5 cm squared x 10 mins constant wave form  Manual therapy: Roller stick to left lateral hip x 15 mintues  Therapeutic exercise; Hip extension standing with knee flex x 15 BLE Hip add sidelying left and right x 15 BLE Bridges x 10 Standing hip add with RTB x 10 BLE  Patient reports pain decreased to feeling tight 1/10 following therapy                           PT Education - 01/25/17 1337    Education provided Yes   Education Details HEP for add  and hip extenison, stopping hip abd   Person(s) Educated Patient   Methods Explanation   Comprehension Verbalized understanding  PT Long Term Goals - 01/03/17 1808      PT LONG TERM GOAL #1   Title Patient will be independent in home exercise program to improve strength/mobility for better functional independence with ADLs.   Time 8   Period Weeks   Status New     PT LONG TERM GOAL #2   Title Patient will increase BLE gross strength to 4+/5 as to improve functional strength for independent gait, increased standing tolerance and increased ADL ability.   Baseline 3+/5   Time 8   Period Weeks   Status New     PT LONG TERM GOAL #3   Title Patient will report a worst pain of 3/10 on VAS in B hips to improve tolerance with ADLs and reduced symptoms with activities.    Baseline 6/10   Time 8   Period Weeks   Status New     PT LONG TERM GOAL #4   Title Patient will increase lower extremity functional scale to >60/80 to demonstrate improved functional mobility and increased tolerance with ADLs.    Baseline  40   Time 8   Period Weeks   Status New               Plan - 01/25/17 1338    Clinical Impression Statement Patient reports left sided tenderness that is getting better over all. She responds to [place] and manual therpay with roller stick followed by hip add and hip extension exercises. She will continue to benefit from contiued skilled PT to decrease pain and improve hip strength.    Rehab Potential Fair   Clinical Impairments Affecting Rehab Potential This patient presents with 1 personal factor: current experience, and 3 body elements including body structures and functions, activity limitations and/or participation restrictions including decreased strength, pain, and participations restrictions such as inability to participate in life situations.  Patient's condition is evolving.   PT Frequency 2x / week   PT Duration 8 weeks   PT Treatment/Interventions ADLs/Self Care Home Management;Aquatic Therapy;Cryotherapy;Electrical Stimulation;Iontophoresis 4mg /ml Dexamethasone;Stair training;Gait training;Ultrasound;Moist Heat;Therapeutic activities;Therapeutic exercise;Patient/family education;Passive range of motion;Manual techniques;Dry needling;Taping   PT Next Visit Plan strengthening   PT Home Exercise Plan advanced- see patient instructions;    Consulted and Agree with Plan of Care Patient      Patient will benefit from skilled therapeutic intervention in order to improve the following deficits and impairments:  Decreased balance, Abnormal gait, Decreased endurance, Decreased coordination, Decreased mobility, Decreased strength, Decreased range of motion, Difficulty walking, Hypomobility, Impaired flexibility, Increased fascial restricitons, Pain  Visit Diagnosis: Trochanteric bursitis of both hips  Pain in left hip     Problem List Patient Active Problem List   Diagnosis Date Noted  . Endometrial hyperplasia without atypia, simple 07/21/2015  . Diverticulosis 07/21/2015  .  Neuritis or radiculitis due to rupture of lumbar intervertebral disc 11/12/2014  . Lumbar canal stenosis 11/12/2014  . Bursitis, trochanteric 11/12/2014  . Back ache 04/10/2014  . Chronic anemia 04/10/2014  . BP (high blood pressure) 04/10/2014  . Blood glucose elevated 04/10/2014  . HLD (hyperlipidemia) 04/10/2014  . Adult hypothyroidism 04/10/2014  . Adiposity 04/10/2014  . Obstructive apnea 04/10/2014   Alanson Puls, PT, DPT [place], Minette [place] S 01/25/2017, 1:44 PM  [place] MAIN [facility] SERVICES [address] [place], [place], [address] Phone: [phone]   Fax:  [phone]  Name: IMA HAFNER MRN: [id number] Date of Birth: 1956/09/15

## 2017-01-30 ENCOUNTER — Encounter: Payer: Self-pay | Admitting: Physical Therapy

## 2017-01-30 ENCOUNTER — Ambulatory Visit: Payer: 59 | Admitting: Physical Therapy

## 2017-01-30 DIAGNOSIS — M7061 Trochanteric bursitis, right hip: Secondary | ICD-10-CM

## 2017-01-30 DIAGNOSIS — M25552 Pain in left hip: Secondary | ICD-10-CM

## 2017-01-30 DIAGNOSIS — M7062 Trochanteric bursitis, left hip: Secondary | ICD-10-CM | POA: Diagnosis not present

## 2017-01-30 NOTE — Therapy (Signed)
[place] MAIN [facility] SERVICES [address] [place], [place], [address] Phone: [phone]   Fax:  [phone]  Physical Therapy Treatment  Patient Details  Name: Rebekah Perkins MRN: [id number] Date of Birth: 1956-06-29 Referring Provider: Sharlet Salina  Encounter Date: 01/30/2017      PT End of Session - 01/30/17 1742    Visit Number 7   Number of Visits 17   Date for PT Re-Evaluation 02/28/17   PT Start Time 1700   PT Stop Time 9485   PT Time Calculation (min) 38 min   Activity Tolerance Patient tolerated treatment well;No increased pain   Behavior During Therapy WFL for tasks assessed/performed      Past Medical History:  Diagnosis Date  . Anxiety   . Apnea   . Diverticulitis   . Environmental allergies   . Hypertension   . Menopausal state   . Obesity   . PMB (postmenopausal bleeding)   . PVD (peripheral vascular disease) ([place])   . Rosacea   . Simple endometrial hyperplasia   . Thyroid disease   . Urinary urgency     Past Surgical History:  Procedure Laterality Date  . CHOLECYSTECTOMY    . DILATION AND CURETTAGE OF UTERUS    . HYSTEROSCOPY      There were no vitals filed for this visit.      Subjective Assessment - 01/30/17 1733    Subjective Patient reports compliance with HEP and she is having 0/10 pain when she arrives today for her treatment session.   Pertinent History Patient's B hip pain begain in September 2016 and she had shots in both hips recently for trochanteric bursitis. She states the shots worked well on the R hip, but not as well on the L hip.   Limitations Standing;Walking   How long can you sit comfortably? 10 minutes   How long can you stand comfortably? unlimited   How long can you walk comfortably? 45 minutes   Diagnostic tests MRI, xray of lumbar spine   Patient Stated Goals wants to be able to know what she can safely do   Currently in Pain? No/denies   Pain Score 0-No pain   Pain  Onset More than a month ago   Multiple Pain Sites No      Treatment: [place] to lateral left hip at 1. 5 cm squared x 10 mins constant wave form  Manual therapy: Roller stick to left lateral hip x 15 mintues  Therapeutic exercise; Hip extension standing with knee flex x 15 BLE Hip add sidelying left and right x 15 BLE Bridges x 10 Standing hip add with RTB x 10 BLE  Patient reports pain increased to feeling tight 4/10 following therapy                           PT Education - 01/30/17 1734    Education provided Yes   Education Details HEP for hip add and hip extension   Person(s) Educated Patient   Methods Explanation   Comprehension Verbalized understanding             PT Long Term Goals - 01/03/17 1808      PT LONG TERM GOAL #1   Title Patient will be independent in home exercise program to improve strength/mobility for better functional independence with ADLs.   Time 8   Period Weeks   Status New     PT LONG  TERM GOAL #2   Title Patient will increase BLE gross strength to 4+/5 as to improve functional strength for independent gait, increased standing tolerance and increased ADL ability.   Baseline 3+/5   Time 8   Period Weeks   Status New     PT LONG TERM GOAL #3   Title Patient will report a worst pain of 3/10 on VAS in B hips to improve tolerance with ADLs and reduced symptoms with activities.    Baseline 6/10   Time 8   Period Weeks   Status New     PT LONG TERM GOAL #4   Title Patient will increase lower extremity functional scale to >60/80 to demonstrate improved functional mobility and increased tolerance with ADLs.    Baseline 40   Time 8   Period Weeks   Status New               Plan - 01/30/17 1743    Clinical Impression Statement Patient reports left sided tenderness is 0/10 today and she is able to lie on her left side and stand for longer periods of time. She does have increased pain following hip exercises  today. She will benefit from skilled PT to improve standing tolerance and be able to sleep on her left side.    Rehab Potential Fair   Clinical Impairments Affecting Rehab Potential This patient presents with 1 personal factor: current experience, and 3 body elements including body structures and functions, activity limitations and/or participation restrictions including decreased strength, pain, and participations restrictions such as inability to participate in life situations.  Patient's condition is evolving.   PT Frequency 2x / week   PT Duration 8 weeks   PT Treatment/Interventions ADLs/Self Care Home Management;Aquatic Therapy;Cryotherapy;Electrical Stimulation;Iontophoresis 4mg /ml Dexamethasone;Stair training;Gait training;Ultrasound;Moist Heat;Therapeutic activities;Therapeutic exercise;Patient/family education;Passive range of motion;Manual techniques;Dry needling;Taping   PT Next Visit Plan strengthening   PT Home Exercise Plan advanced- see patient instructions;    Consulted and Agree with Plan of Care Patient      Patient will benefit from skilled therapeutic intervention in order to improve the following deficits and impairments:  Decreased balance, Abnormal gait, Decreased endurance, Decreased coordination, Decreased mobility, Decreased strength, Decreased range of motion, Difficulty walking, Hypomobility, Impaired flexibility, Increased fascial restricitons, Pain  Visit Diagnosis: Trochanteric bursitis of both hips  Pain in left hip     Problem List Patient Active Problem List   Diagnosis Date Noted  . Endometrial hyperplasia without atypia, simple 07/21/2015  . Diverticulosis 07/21/2015  . Neuritis or radiculitis due to rupture of lumbar intervertebral disc 11/12/2014  . Lumbar canal stenosis 11/12/2014  . Bursitis, trochanteric 11/12/2014  . Back ache 04/10/2014  . Chronic anemia 04/10/2014  . BP (high blood pressure) 04/10/2014  . Blood glucose elevated 04/10/2014   . HLD (hyperlipidemia) 04/10/2014  . Adult hypothyroidism 04/10/2014  . Adiposity 04/10/2014  . Obstructive apnea 04/10/2014   Alanson Puls, PT, DPT [place] S 01/30/2017, 5:49 PM  [place] MAIN [facility] SERVICES [address] [place], [place], [address] Phone: [phone]   Fax:  [phone]  Name: CHANELL NADEAU MRN: [id number] Date of Birth: 03-04-1956

## 2017-02-01 ENCOUNTER — Ambulatory Visit: Payer: 59 | Admitting: Physical Therapy

## 2017-02-01 ENCOUNTER — Encounter: Payer: Self-pay | Admitting: Physical Therapy

## 2017-02-01 DIAGNOSIS — M7062 Trochanteric bursitis, left hip: Secondary | ICD-10-CM | POA: Diagnosis not present

## 2017-02-01 DIAGNOSIS — M25552 Pain in left hip: Secondary | ICD-10-CM

## 2017-02-01 DIAGNOSIS — M7061 Trochanteric bursitis, right hip: Secondary | ICD-10-CM

## 2017-02-01 NOTE — Therapy (Signed)
[place] MAIN [facility] SERVICES [address] [place], [place], [address] Phone: [phone]   Fax:  [phone]  Physical Therapy Treatment  Patient Details  Name: Rebekah Perkins MRN: [id number] Date of Birth: September 27, 1956 Referring Provider: Sharlet Salina  Encounter Date: 02/01/2017      PT End of Session - 02/01/17 1728    Visit Number 8   Number of Visits 17   Date for PT Re-Evaluation 02/28/17   PT Start Time 1700   PT Stop Time 9373   PT Time Calculation (min) 25 min   Activity Tolerance Patient tolerated treatment well;No increased pain   Behavior During Therapy WFL for tasks assessed/performed      Past Medical History:  Diagnosis Date  . Anxiety   . Apnea   . Diverticulitis   . Environmental allergies   . Hypertension   . Menopausal state   . Obesity   . PMB (postmenopausal bleeding)   . PVD (peripheral vascular disease) ([place])   . Rosacea   . Simple endometrial hyperplasia   . Thyroid disease   . Urinary urgency     Past Surgical History:  Procedure Laterality Date  . CHOLECYSTECTOMY    . DILATION AND CURETTAGE OF UTERUS    . HYSTEROSCOPY      There were no vitals filed for this visit.      Subjective Assessment - 02/01/17 1704    Subjective (P)  Patient reports compliance with HEP and she is having 0/10 pain when she arrives today for her treatment session, but she strainned her back today trying to get her snow boots on .    Pertinent History (P)  Patient's B hip pain begain in September 2016 and she had shots in both hips recently for trochanteric bursitis. She states the shots worked well on the R hip, but not as well on the L hip.   Limitations (P)  Standing;Walking   How long can you sit comfortably? (P)  10 minutes   How long can you stand comfortably? (P)  unlimited   How long can you walk comfortably? (P)  45 minutes   Diagnostic tests (P)  MRI, xray of lumbar spine   Patient Stated Goals (P)   wants to be able to know what she can safely do   Currently in Pain? (P)  Yes   Pain Score (P)  6    Pain Location (P)  Back   Pain Orientation (P)  Lower;Right   Pain Descriptors / Indicators (P)  Aching   Pain Radiating Towards (P)  not radiating   Pain Onset (P)  More than a month ago   Pain Frequency (P)  Intermittent      Treatment: [place] to lateral left hip at 1. 5 cm squared x 10 mins constant wave form   Manual therapy: Roller stick to left lateral hip x 15 mintues   Therapeutic exercise; Hip extension standing with knee flex x 15 BLE Hip add sidelying left and right x 15 BLE Bridges x 10 Standing hip add with RTB x 10 BLE   Patient reports pain  0/10 following therapy                           PT Education - 02/01/17 1728    Education provided Yes   Education Details HEP   Person(s) Educated Patient   Methods Explanation   Comprehension Verbalized understanding  PT Long Term Goals - 01/03/17 1808      PT LONG TERM GOAL #1   Title Patient will be independent in home exercise program to improve strength/mobility for better functional independence with ADLs.   Time 8   Period Weeks   Status New     PT LONG TERM GOAL #2   Title Patient will increase BLE gross strength to 4+/5 as to improve functional strength for independent gait, increased standing tolerance and increased ADL ability.   Baseline 3+/5   Time 8   Period Weeks   Status New     PT LONG TERM GOAL #3   Title Patient will report a worst pain of 3/10 on VAS in B hips to improve tolerance with ADLs and reduced symptoms with activities.    Baseline 6/10   Time 8   Period Weeks   Status New     PT LONG TERM GOAL #4   Title Patient will increase lower extremity functional scale to >60/80 to demonstrate improved functional mobility and increased tolerance with ADLs.    Baseline 40   Time 8   Period Weeks   Status New               Plan - 02/01/17  1729    Clinical Impression Statement Patient reports that her left hip is feeling much better today 0/10. She is instructed in hip exericses followed by [place] and manual therpay to left hip. Patient has no pain to left hip following treatment.   Rehab Potential Fair   Clinical Impairments Affecting Rehab Potential This patient presents with 1 personal factor: current experience, and 3 body elements including body structures and functions, activity limitations and/or participation restrictions including decreased strength, pain, and participations restrictions such as inability to participate in life situations.  Patient's condition is evolving.   PT Frequency 2x / week   PT Duration 8 weeks   PT Treatment/Interventions ADLs/Self Care Home Management;Aquatic Therapy;Cryotherapy;Electrical Stimulation;Iontophoresis 4mg /ml Dexamethasone;Stair training;Gait training;Ultrasound;Moist Heat;Therapeutic activities;Therapeutic exercise;Patient/family education;Passive range of motion;Manual techniques;Dry needling;Taping   PT Next Visit Plan strengthening   PT Home Exercise Plan advanced- see patient instructions;    Consulted and Agree with Plan of Care Patient      Patient will benefit from skilled therapeutic intervention in order to improve the following deficits and impairments:  Decreased balance, Abnormal gait, Decreased endurance, Decreased coordination, Decreased mobility, Decreased strength, Decreased range of motion, Difficulty walking, Hypomobility, Impaired flexibility, Increased fascial restricitons, Pain  Visit Diagnosis: Trochanteric bursitis of both hips  Pain in left hip     Problem List Patient Active Problem List   Diagnosis Date Noted  . Endometrial hyperplasia without atypia, simple 07/21/2015  . Diverticulosis 07/21/2015  . Neuritis or radiculitis due to rupture of lumbar intervertebral disc 11/12/2014  . Lumbar canal stenosis 11/12/2014  . Bursitis, trochanteric 11/12/2014   . Back ache 04/10/2014  . Chronic anemia 04/10/2014  . BP (high blood pressure) 04/10/2014  . Blood glucose elevated 04/10/2014  . HLD (hyperlipidemia) 04/10/2014  . Adult hypothyroidism 04/10/2014  . Adiposity 04/10/2014  . Obstructive apnea 04/10/2014   Alanson Puls, PT, DPT [place], [place] S 02/01/2017, 5:33 PM  [place] MAIN [facility] SERVICES [address] [place], [place], [address] Phone: [phone]   Fax:  [phone]  Name: RICKI VANHANDEL MRN: [id number] Date of Birth: 1956/09/03

## 2017-02-06 ENCOUNTER — Ambulatory Visit: Payer: 59 | Admitting: Physical Therapy

## 2017-02-08 ENCOUNTER — Encounter: Payer: 59 | Admitting: Obstetrics and Gynecology

## 2017-02-08 ENCOUNTER — Ambulatory Visit: Payer: 59 | Admitting: Physical Therapy

## 2017-02-13 ENCOUNTER — Ambulatory Visit: Payer: 59 | Attending: Physical Medicine and Rehabilitation | Admitting: Physical Therapy

## 2017-02-15 ENCOUNTER — Ambulatory Visit: Payer: 59 | Admitting: Physical Therapy

## 2017-03-08 ENCOUNTER — Encounter: Payer: Self-pay | Admitting: Physical Therapy

## 2017-03-08 DIAGNOSIS — M25552 Pain in left hip: Secondary | ICD-10-CM

## 2017-03-08 DIAGNOSIS — M7062 Trochanteric bursitis, left hip: Principal | ICD-10-CM

## 2017-03-08 DIAGNOSIS — M7061 Trochanteric bursitis, right hip: Secondary | ICD-10-CM

## 2017-03-08 NOTE — Therapy (Signed)
[place] MAIN [facility] SERVICES [address] [place], [place], [address] Phone: [phone]   Fax:  [phone]  March 08, 2017   _0 @  Physical Therapy Discharge Summary  Patient: Rebekah Perkins  MRN: [id number]  Date of Birth: 1956/10/01   Diagnosis: Trochanteric bursitis of both hips  Pain in left hip Referring Provider: Sharlet Salina  The above patient had been seen in Physical Therapy 8 times of 17 treatments scheduled from 01/03/17 to 02/01/17.  The treatment consisted of [place] and there exercises.  The patient is: Improved  Subjective: Patient failed to return to therapy and did not return calls.  Discharge Findings: unable as patient missed last appointment   Goals Partially Met    Sincerely,   Obryan Radu, Sherryl Barters, PT, DPT   CC _1 @  [place] [address] [place], [place], [address] Phone: [phone]   Fax:  [phone]  Patient: [name]  MRN: [id number]  Date of Birth: [birth year]

## 2017-04-14 ENCOUNTER — Encounter: Payer: Self-pay | Admitting: Physician Assistant

## 2017-04-14 ENCOUNTER — Ambulatory Visit: Payer: Self-pay | Admitting: Physician Assistant

## 2017-04-14 VITALS — BP 114/80 | HR 69 | Temp 98.0°F

## 2017-04-14 DIAGNOSIS — W57XXXA Bitten or stung by nonvenomous insect and other nonvenomous arthropods, initial encounter: Secondary | ICD-10-CM

## 2017-04-14 MED ORDER — DOXYCYCLINE HYCLATE 100 MG PO TABS: 100.0000 mg | ORAL_TABLET | Freq: Two times a day (BID) | ORAL | 0 refills | Status: DC

## 2017-04-14 NOTE — Progress Notes (Signed)
S: c/o tick bite, small tick, pulled it off last night and area is red and swollen, no fever/chills/joint pain  O: vitals wnl, nad, skin with quarter sized red swollen area at bite site, tick head intact, neck supple no lymph, n/v intact  A: tick bite  P: doxy 100mg  bid x 12 d

## 2017-04-20 ENCOUNTER — Ambulatory Visit: Payer: Self-pay | Admitting: Physician Assistant

## 2017-04-20 ENCOUNTER — Encounter: Payer: Self-pay | Admitting: Physician Assistant

## 2017-04-20 ENCOUNTER — Ambulatory Visit
Admission: RE | Admit: 2017-04-20 | Discharge: 2017-04-20 | Disposition: A | Discharge status: Home / Self Care | Payer: 59 | Source: Ambulatory Visit | Attending: Physician Assistant | Admitting: Physician Assistant

## 2017-04-20 VITALS — BP 120/70 | HR 78 | Temp 98.1°F

## 2017-04-20 DIAGNOSIS — I809 Phlebitis and thrombophlebitis of unspecified site: Secondary | ICD-10-CM | POA: Diagnosis not present

## 2017-04-20 DIAGNOSIS — L539 Erythematous condition, unspecified: Secondary | ICD-10-CM | POA: Diagnosis not present

## 2017-04-20 NOTE — Progress Notes (Signed)
   Subjective:Right lower leg pain    Patient ID: Rebekah Perkins, female    DOB: 14-Nov-1956, 61 y.o.   MRN: [id number]  HPI Pain c/o pain to calf s/p contusion to lower leg yesterday. Notice increased redness and calf pain this morning. Denies chest pain or dyspnea.Except for ASA no other blood thinner.   Review of Systems    Obesity, HTN, and GERD Objective:   Physical Exam Obvious edema and erythema to anterior distal third of right leg. Moderate guarding with palpation at impact site and right calf. Distal pulsed intact.       Assessment & Plan: Leg contusion  To [facility] right lower extremity which was normal. Advise conservative care and work note given for 2 days.

## 2017-05-01 ENCOUNTER — Ambulatory Visit
Admission: RE | Admit: 2017-05-01 | Discharge: 2017-05-01 | Disposition: A | Discharge status: Home / Self Care | Payer: 59 | Source: Ambulatory Visit | Attending: Obstetrics and Gynecology | Admitting: Obstetrics and Gynecology

## 2017-05-01 DIAGNOSIS — Z1239 Encounter for other screening for malignant neoplasm of breast: Secondary | ICD-10-CM

## 2017-05-01 DIAGNOSIS — Z1231 Encounter for screening mammogram for malignant neoplasm of breast: Secondary | ICD-10-CM | POA: Diagnosis not present

## 2017-05-02 DIAGNOSIS — Z0000 Encounter for general adult medical examination without abnormal findings: Secondary | ICD-10-CM | POA: Diagnosis not present

## 2017-05-02 DIAGNOSIS — R739 Hyperglycemia, unspecified: Secondary | ICD-10-CM | POA: Diagnosis not present

## 2017-05-02 DIAGNOSIS — E7800 Pure hypercholesterolemia, unspecified: Secondary | ICD-10-CM | POA: Diagnosis not present

## 2017-05-02 DIAGNOSIS — E039 Hypothyroidism, unspecified: Secondary | ICD-10-CM | POA: Diagnosis not present

## 2017-05-02 DIAGNOSIS — Z79899 Other long term (current) drug therapy: Secondary | ICD-10-CM | POA: Diagnosis not present

## 2017-05-02 DIAGNOSIS — I10 Essential (primary) hypertension: Secondary | ICD-10-CM | POA: Diagnosis not present

## 2017-05-02 DIAGNOSIS — G4733 Obstructive sleep apnea (adult) (pediatric): Secondary | ICD-10-CM | POA: Diagnosis not present

## 2017-05-08 ENCOUNTER — Ambulatory Visit
Admission: RE | Admit: 2017-05-08 | Discharge: 2017-05-08 | Disposition: A | Discharge status: Home / Self Care | Payer: 59 | Source: Ambulatory Visit | Attending: Internal Medicine | Admitting: Internal Medicine

## 2017-05-08 ENCOUNTER — Ambulatory Visit: Payer: 59

## 2017-05-08 ENCOUNTER — Other Ambulatory Visit: Payer: Self-pay | Admitting: Internal Medicine

## 2017-05-08 DIAGNOSIS — R600 Localized edema: Secondary | ICD-10-CM | POA: Insufficient documentation

## 2017-05-08 DIAGNOSIS — R262 Difficulty in walking, not elsewhere classified: Secondary | ICD-10-CM | POA: Diagnosis not present

## 2017-05-08 DIAGNOSIS — R51 Headache: Principal | ICD-10-CM

## 2017-05-08 DIAGNOSIS — R519 Headache, unspecified: Secondary | ICD-10-CM

## 2017-05-08 DIAGNOSIS — I10 Essential (primary) hypertension: Secondary | ICD-10-CM | POA: Diagnosis not present

## 2017-05-08 DIAGNOSIS — R42 Dizziness and giddiness: Secondary | ICD-10-CM | POA: Diagnosis not present

## 2017-05-26 DIAGNOSIS — M25561 Pain in right knee: Secondary | ICD-10-CM | POA: Diagnosis not present

## 2017-05-26 DIAGNOSIS — M1711 Unilateral primary osteoarthritis, right knee: Secondary | ICD-10-CM | POA: Diagnosis not present

## 2017-09-15 ENCOUNTER — Encounter: Payer: Self-pay | Admitting: Physician Assistant

## 2017-09-15 ENCOUNTER — Ambulatory Visit: Payer: Self-pay | Admitting: Physician Assistant

## 2017-09-15 VITALS — BP 142/84 | HR 78 | Temp 98.1°F

## 2017-09-15 DIAGNOSIS — J0100 Acute maxillary sinusitis, unspecified: Secondary | ICD-10-CM

## 2017-09-15 MED ORDER — CEFDINIR 300 MG PO CAPS: 300.0000 mg | ORAL_CAPSULE | Freq: Two times a day (BID) | ORAL | 0 refills | Status: DC

## 2017-09-15 MED ORDER — PREDNISONE 10 MG PO TABS: 30.0000 mg | ORAL_TABLET | Freq: Every day | ORAL | 0 refills | Status: DC

## 2017-09-15 NOTE — Progress Notes (Signed)
S: C/o runny nose and congestion for 7 days, no fever, chills, cp/sob, v/d; mucus is white and thick, cough is sporadic, c/o of facial and dental pain.   Using otc meds:   O: PE: perrl eomi, normocephalic, tms dull, nasal mucosa red and swollen, throat injected, neck supple no lymph, lungs c t a, cv rrr, neuro intact  A:  Acute sinusitis   P: drink fluids, continue regular meds , use otc meds of choice, return if not improving in 5 days, return earlier if worsening  omnicef 300mg  bid, pred 30mg  qd x 3d

## 2017-09-25 ENCOUNTER — Encounter: Payer: Self-pay | Admitting: Physician Assistant

## 2017-09-25 ENCOUNTER — Ambulatory Visit: Payer: Self-pay | Admitting: Physician Assistant

## 2017-09-25 VITALS — BP 136/84 | HR 79 | Temp 98.4°F

## 2017-09-25 DIAGNOSIS — R51 Headache: Secondary | ICD-10-CM

## 2017-09-25 DIAGNOSIS — J069 Acute upper respiratory infection, unspecified: Secondary | ICD-10-CM

## 2017-09-25 DIAGNOSIS — R519 Headache, unspecified: Secondary | ICD-10-CM

## 2017-09-25 MED ORDER — METHYLPREDNISOLONE 4 MG PO TBPK: ORAL_TABLET | ORAL | 0 refills | Status: DC

## 2017-09-25 NOTE — Progress Notes (Signed)
S: Patient states she still has cough and congestion. Some sinus drainage, sinus headache, mucus is clear. Denies fever, chills, chest pain, shortness of breath. The worst part is her head is stuffed up. She just finished Omnicef  O: Vitals are normal, TMs are clear, nasal mucosa is swollen, throat is normal, neck is supple, no lymphadenopathy noted, lungs are clear to auscultation, heart sounds are normal  A: Acute viral illness  P: Medrol Dosepak, explained the patient that a viral illness does not need an antibiotic. She can take Mucinex D that she just bought

## 2017-10-09 DIAGNOSIS — J0101 Acute recurrent maxillary sinusitis: Secondary | ICD-10-CM | POA: Diagnosis not present

## 2017-10-09 DIAGNOSIS — J209 Acute bronchitis, unspecified: Secondary | ICD-10-CM | POA: Diagnosis not present

## 2017-10-09 DIAGNOSIS — R05 Cough: Secondary | ICD-10-CM | POA: Diagnosis not present

## 2017-11-02 DIAGNOSIS — Z79899 Other long term (current) drug therapy: Secondary | ICD-10-CM | POA: Diagnosis not present

## 2017-11-02 DIAGNOSIS — R739 Hyperglycemia, unspecified: Secondary | ICD-10-CM | POA: Diagnosis not present

## 2017-11-02 DIAGNOSIS — G4733 Obstructive sleep apnea (adult) (pediatric): Secondary | ICD-10-CM | POA: Diagnosis not present

## 2017-11-02 DIAGNOSIS — E039 Hypothyroidism, unspecified: Secondary | ICD-10-CM | POA: Diagnosis not present

## 2017-11-02 DIAGNOSIS — I10 Essential (primary) hypertension: Secondary | ICD-10-CM | POA: Diagnosis not present

## 2017-11-02 DIAGNOSIS — E7800 Pure hypercholesterolemia, unspecified: Secondary | ICD-10-CM | POA: Diagnosis not present

## 2017-11-10 DIAGNOSIS — I10 Essential (primary) hypertension: Secondary | ICD-10-CM | POA: Diagnosis not present

## 2017-11-24 ENCOUNTER — Other Ambulatory Visit: Payer: Self-pay | Admitting: Obstetrics and Gynecology

## 2017-12-06 DIAGNOSIS — Z872 Personal history of diseases of the skin and subcutaneous tissue: Secondary | ICD-10-CM | POA: Diagnosis not present

## 2017-12-06 DIAGNOSIS — L578 Other skin changes due to chronic exposure to nonionizing radiation: Secondary | ICD-10-CM | POA: Diagnosis not present

## 2017-12-06 DIAGNOSIS — L821 Other seborrheic keratosis: Secondary | ICD-10-CM | POA: Diagnosis not present

## 2017-12-06 DIAGNOSIS — L718 Other rosacea: Secondary | ICD-10-CM | POA: Diagnosis not present

## 2017-12-06 DIAGNOSIS — Z86018 Personal history of other benign neoplasm: Secondary | ICD-10-CM | POA: Diagnosis not present

## 2017-12-24 IMAGING — CT CT HEAD W/O CM
3 series · 16 of 47 positions shown, 19 images · non-contrast
Comparison: None.

CLINICAL DATA: Non intractable headache

EXAM:
CT HEAD WITHOUT CONTRAST
TECHNIQUE: Contiguous axial images were obtained from the base of the skull
through the vertex without intravenous contrast.

[Series 2: head wo · axial · 0.41mm/px · z∈[-82,+48]mm · 10 of 32 slices shown, 13 images]
[im 3/32  brain]
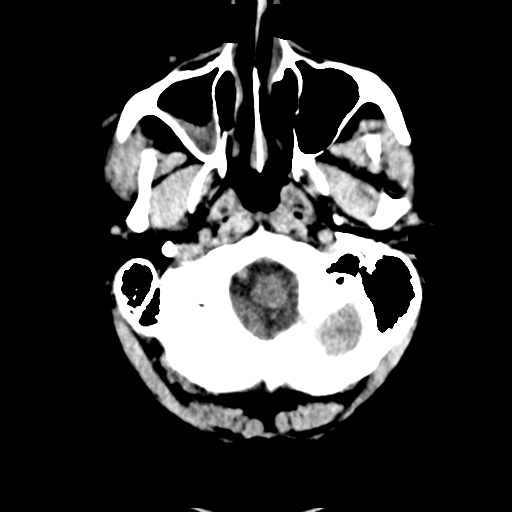
[im 3/32  bone]
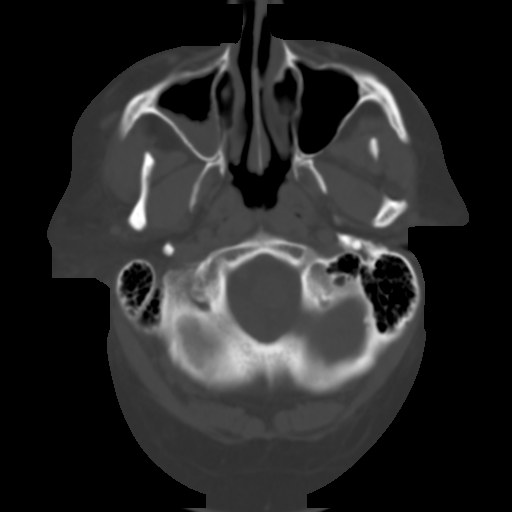
[im 6/32  brain]
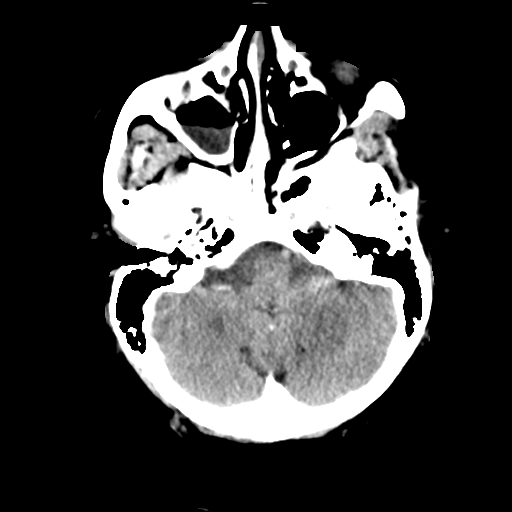
[im 9/32  brain]
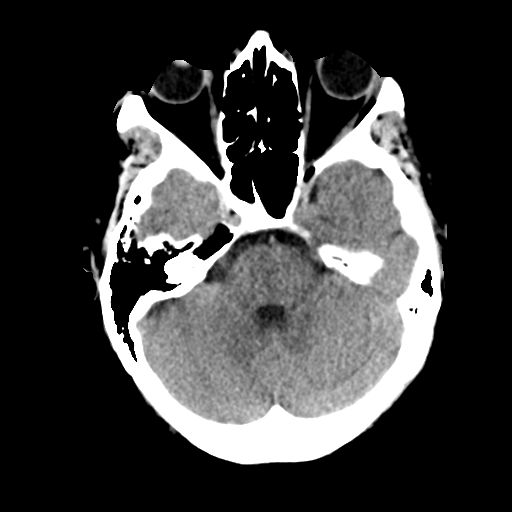
[im 11/32  brain]
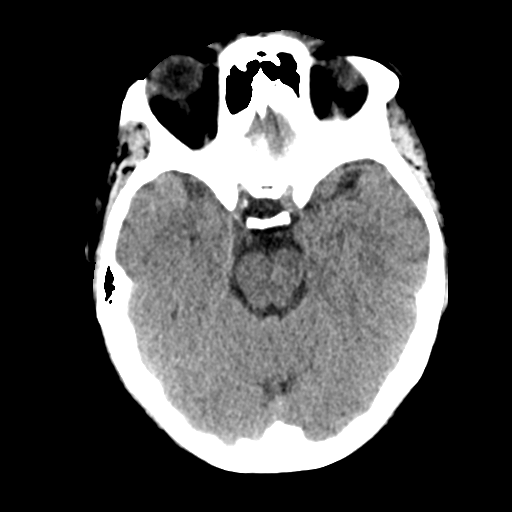
[im 14/32  brain]
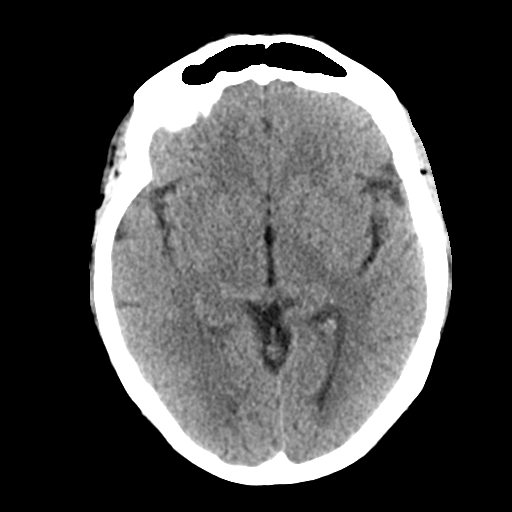
[im 14/32  bone]
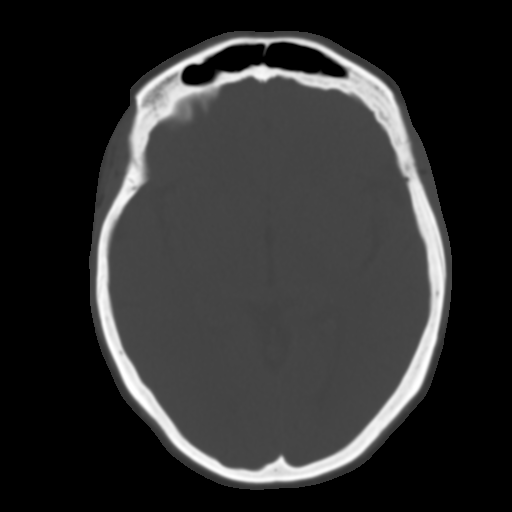
[im 18/32  brain]
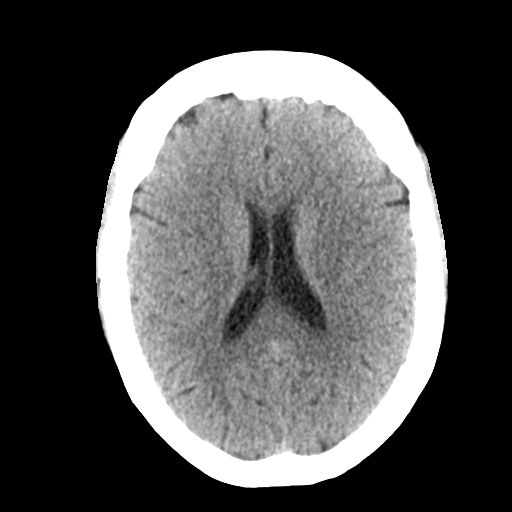
[im 21/32  brain]
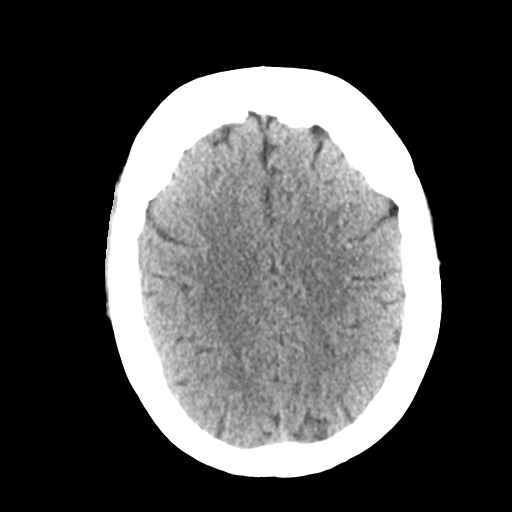
[im 24/32  brain]
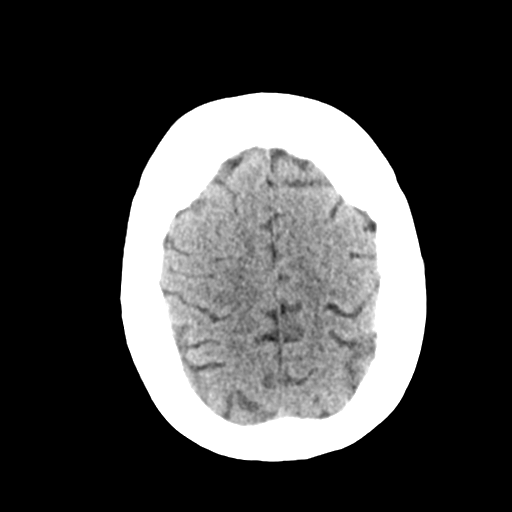
[im 26/32  brain]
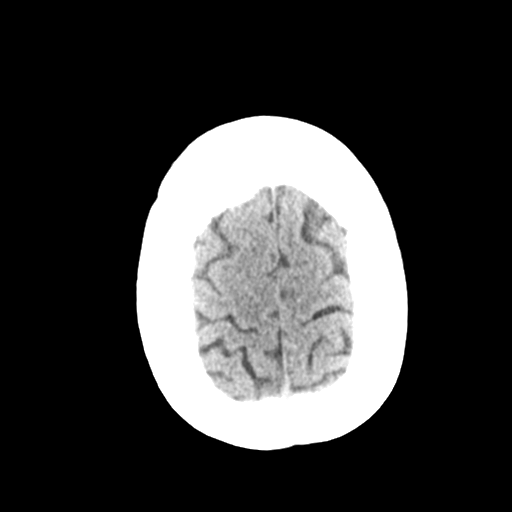
[im 26/32  bone]
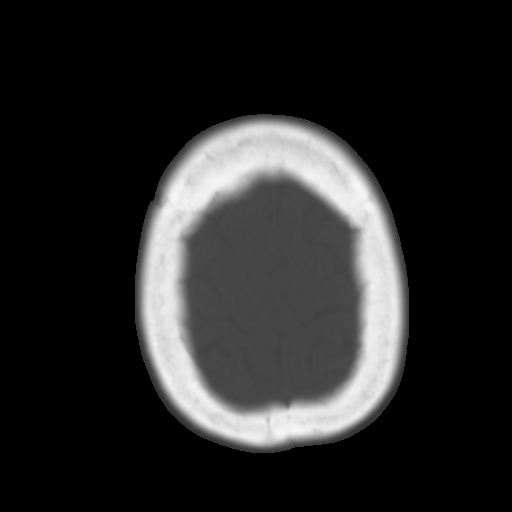
[im 29/32  brain]
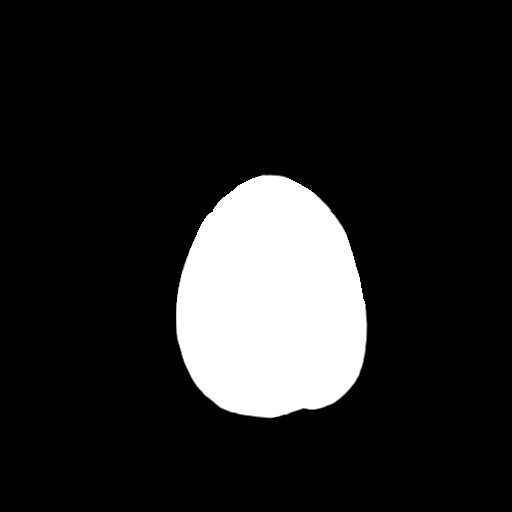

[Series 4: coronal soft tissue · coronal · 0.34mm/px · 3 of 69 slices shown]
[im 23/69  brain]
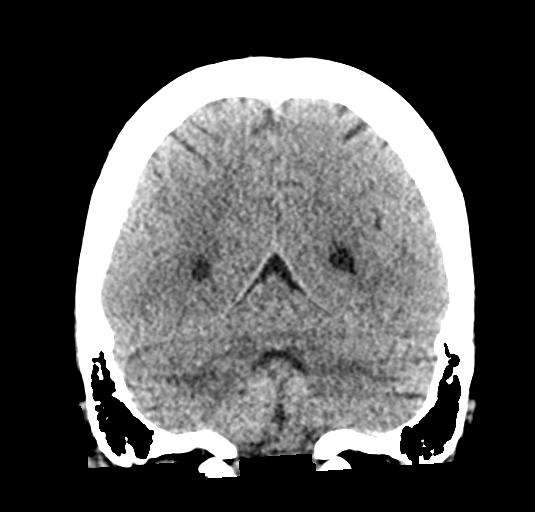
[im 31/69  brain]
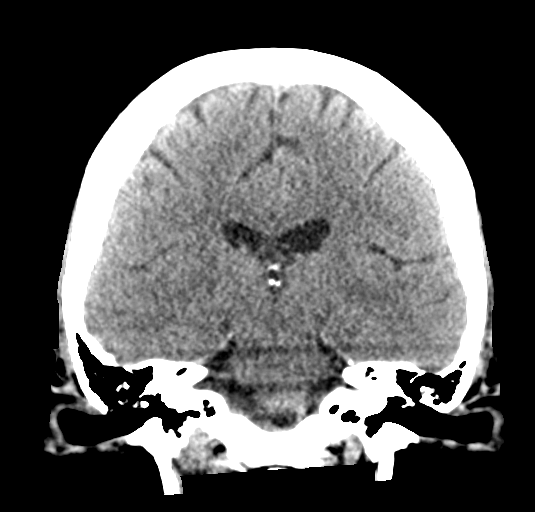
[im 38/69  brain]
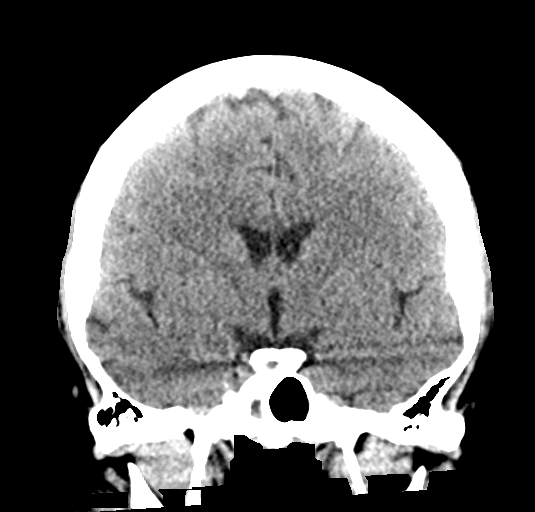

[Series 5: sagittal soft tissue · sagittal · 0.34mm/px · 3 of 60 slices shown]
[im 20/60  brain]
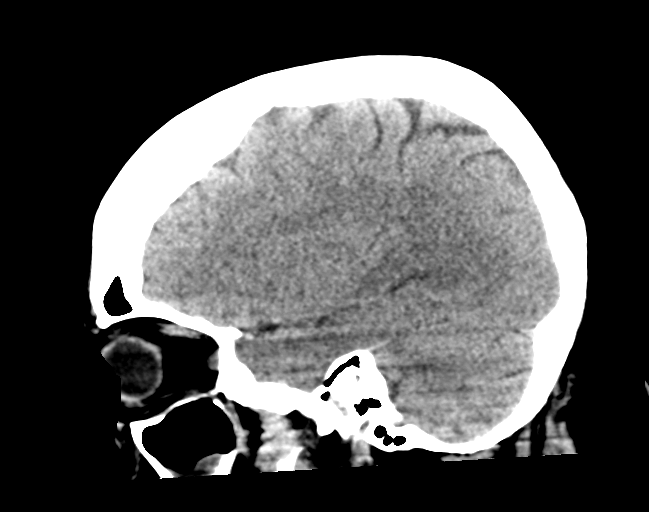
[im 30/60  brain]
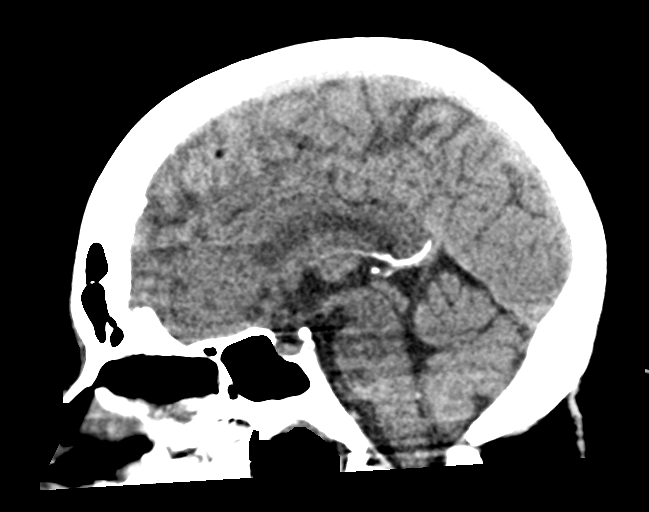
[im 40/60  brain]
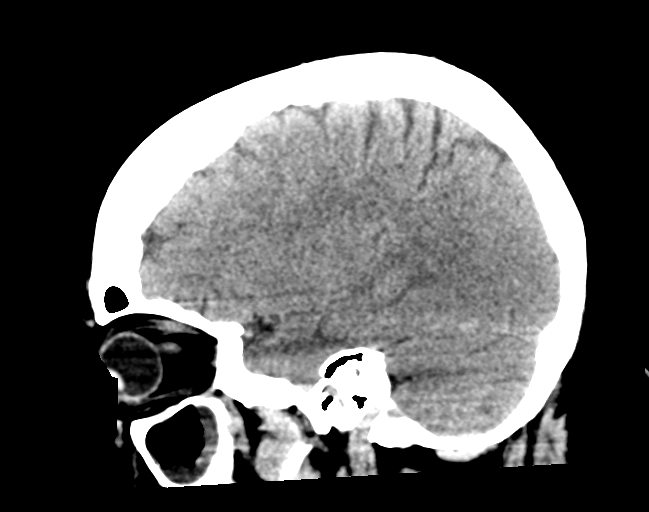

[16 of 47 positions shown; findings below may reference images not displayed]

FINDINGS: Brain: No evidence of acute infarction, hemorrhage, hydrocephalus,
extra-axial collection or mass lesion/mass effect.

Vascular: No hyperdense vessel or unexpected calcification.

Skull: Negative

Sinuses/Orbits: Air-fluid level right maxillary sinus with
associated mucosal edema in the maxillary sinus bilaterally. Small
air-fluid level right sphenoid sinus. Normal orbit.

Other: None
IMPRESSION: Negative CT of the brain without contrast

Mucosal edema in the paranasal sinuses with air-fluid levels in the
right maxillary and sphenoid sinus.

## 2018-01-05 NOTE — Progress Notes (Signed)
Patient ID: Rebekah Perkins, female   DOB: 1955/12/29, 62 y.o.   MRN: [id number] Annual visit: Provera 10mg . Days 1-10  GYN ANNUAL PREVENTATIVE CARE ENCOUNTER NOTE  Subjective:       Rebekah Perkins is a 62 y.o. G3 P72 female here for a routine annual gynecologic exam.  Current complaints:    History of simple endometrial hyperplasia without atypia; thickened endometrium, 6.4 mm, on ultrasound 07/21/2015; endometrial biopsy-atrophic endometrium with breakdown without evidence of hyperplasia on 07/21/2015; treated with Provera 10 mg daily days 1 through 10 each month.  Currently no abnormal uterine bleeding on Provera therapy. Has sister with similar problem with hyperplasia and abnormal uterine bleeding; status post BKA, and possibly needing surgery in the future with multiple significant comorbidities. Patient has gained 13 pounds in the past 6 months; feels the weight gain and needs to address this.  She sees Dr. Doy Hutching in internal medicine every 4 months.  Gynecologic History No LMP recorded. Patient is postmenopausal. Contraception: post menopausal status Mammo: 05/01/2017  Birad 1 Pap: 01/06/2017 neg/neg History of hysteroscopy/D&C Family History of breast cancer in maternal grandmother and maternal aunt Obstetric History OB History  Gravida Para Term Preterm AB Living  3 3 3     3   SAB TAB Ectopic Multiple Live Births          3    # Outcome Date GA Lbr Len/2nd Weight Sex Delivery Anes PTL Lv  3 Term 1978   7 lb 1.6 oz (3.221 kg) M Vag-Spont   LIV  2 Term 1977   7 lb (3.175 kg) M Vag-Spont   LIV  1 Term 1975   7 lb 1.9 oz (3.23 kg) M Vag-Spont   LIV      Past Medical History:  Diagnosis Date  . Anxiety   . Apnea   . Diverticulitis   . Environmental allergies   . Hypertension   . Menopausal state   . Obesity   . PMB (postmenopausal bleeding)   . PVD (peripheral vascular disease) ([place])   . Rosacea   . Simple endometrial hyperplasia   . Thyroid disease   . Urinary  urgency     Past Surgical History:  Procedure Laterality Date  . CHOLECYSTECTOMY    . DILATION AND CURETTAGE OF UTERUS    . HYSTEROSCOPY      Current Outpatient Medications on File Prior to Visit  Medication Sig Dispense Refill  . acetaminophen (TYLENOL) 500 MG tablet Take 1,500 mg by mouth every 6 (six) hours as needed for mild pain or headache.    Marland Kitchen aspirin EC 81 MG tablet Take 81 mg by mouth daily.    . bisoprolol-hydrochlorothiazide (ZIAC) 5-6.25 MG per tablet Take 1 tablet by mouth daily.    . calcium carbonate (OS-CAL) 600 MG TABS tablet Take 600 mg by mouth at bedtime.    . cefdinir (OMNICEF) 300 MG capsule Take 1 capsule (300 mg total) by mouth 2 (two) times daily. 20 capsule 0  . cetirizine (ZYRTEC) 10 MG tablet Take 10 mg by mouth at bedtime.    . docusate sodium (COLACE) 100 MG capsule Take 100 mg by mouth 2 (two) times daily.    Marland Kitchen doxycycline (VIBRA-TABS) 100 MG tablet Take 1 tablet (100 mg total) by mouth 2 (two) times daily. (Patient not taking: Reported on 09/15/2017) 20 tablet 0  . ferrous sulfate 325 (65 FE) MG tablet Take 325 mg by mouth at bedtime.    . fluticasone (FLONASE) 50  MCG/ACT nasal spray Place 2 sprays into both nostrils daily.    Marland Kitchen gabapentin (NEURONTIN) 300 MG capsule Take 300 mg by mouth at bedtime.    . hydrochlorothiazide (HYDRODIURIL) 25 MG tablet Take 25 mg by mouth daily.    Marland Kitchen levothyroxine (SYNTHROID, LEVOTHROID) 150 MCG tablet TAKE 1 TABLET BY MOUTH ONCE DAILY. TAKE ON AN EMPTY STOMACH WITH A GLASS OF WATER AT LEAST 30-60 MINUTES BEFORE BREAKFAST.    . medroxyPROGESTERone (PROVERA) 10 MG tablet Take 1 tablet (10 mg total) by mouth daily. Days 1 through 10 10 tablet 6  . medroxyPROGESTERone (PROVERA) 10 MG tablet TAKE 1 TABLET BY MOUTH ONCE DAILY ON DAYS 1 TO 10 EACH MONTH 10 tablet 6  . methylPREDNISolone (MEDROL DOSEPAK) 4 MG TBPK tablet Take 6 pills on day one then decrease by 1 pill each day 21 tablet 0  . pantoprazole (PROTONIX) 40 MG tablet Take  by mouth.    . phentermine (ADIPEX-P) 37.5 MG tablet Take 37.5 mg by mouth daily.    . predniSONE (DELTASONE) 10 MG tablet Take 3 tablets (30 mg total) by mouth daily with breakfast. 9 tablet 0  . traZODone (DESYREL) 50 MG tablet Take by mouth.    . vitamin B-12 (CYANOCOBALAMIN) 1000 MCG tablet Take 1,000 mcg by mouth daily.     No current facility-administered medications on file prior to visit.     Allergies  Allergen Reactions  . Azithromycin Itching  . Penicillins Itching    Social History   Socioeconomic History  . Marital status: Single    Spouse name: Not on file  . Number of children: Not on file  . Years of education: Not on file  . Highest education level: Not on file  Social Needs  . Financial resource strain: Not on file  . Food insecurity - worry: Not on file  . Food insecurity - inability: Not on file  . Transportation needs - medical: Not on file  . Transportation needs - non-medical: Not on file  Occupational History  . Not on file  Tobacco Use  . Smoking status: Passive Smoke Exposure - Never Smoker  . Smokeless tobacco: Never Used  Substance and Sexual Activity  . Alcohol use: No  . Drug use: No  . Sexual activity: No  Other Topics Concern  . Not on file  Social History Narrative  . Not on file    Family History  Problem Relation Age of Onset  . Cancer Maternal Grandmother   . Colon cancer Maternal Grandmother   . Breast cancer Maternal Grandmother 80  . Diabetes Mother   . Heart disease Mother   . Diabetes Sister   . Breast cancer Maternal Aunt 60  . Lung cancer Maternal Aunt   . Ovarian cancer Neg Hx     The following portions of the patient's history were reviewed and updated as appropriate: allergies, current medications, past family history, past medical history, past social history, past surgical history and problem list.  Review of Systems Review of Systems  Constitutional:       13 pound weight gain in the past 6 months  HENT:  Negative.   Eyes: Negative.   Respiratory: Negative.   Cardiovascular: Negative.   Gastrointestinal: Positive for constipation.  Genitourinary: Negative.   Skin: Negative.   Psychiatric/Behavioral: Negative.    OBJECTIVE: BP 114/73   Pulse 70   Ht 5\' 3"  (1.6 m)   Wt 293 lb 6.4 oz (133.1 kg)   BMI 51.97 kg/m  Pleasant well-appearing obese female in no acute distress.  Alert and oriented. HEENT: Normocephalic atraumatic Neck: Supple without thyromegaly or adenopathy Breast: Bilaterally symmetric without dominant mass adenopathy or nipple discharge; brown macule noted at 7:00 right breast, benign in appearance; multiple skin tags present on thorax Lungs: Clear Heart: Regular rate and rhythm without murmur S3 or S4 Abdomen: Soft, nontender, without organomegaly; large panniculus; no abdominal rash Back: No CVA tenderness Pelvic: External genitalia-normal BUS-normal Vagina-good estrogen effect; no significant discharge Cervix-not visualized with Peterson speculum; situated high in vaginal vault Uterus-not palpable due to body habitus Adnexa-nontender; not palpable due to body habitus Rectovaginal-moderate external hemorrhoids, tender, nonthrombosed; normal sphincter tone; no rectal masses; firm stool noted in rectal vault Extremities: Without significant edema or rash; warm and dry     Assessment:   Annual gynecologic examination 62 y.o. Contraception: post menopausal status Morbid obesity-BMI 52 History of simple endometrial hyperplasia without atypia; on Provera monthly; No withdrawal bleeding Hypertension Diabetes   Plan:  Pap:Due 2021 Mammogram: Ordered Labs: per Dr. Doy Hutching  Stool cards- ordered thru Dr Doy Hutching Routine preventative health maintenance measures emphasized: Diet/Weight control, Tobacco Cessation and Alcohol/Drug use Continue cyclic Provera 10 mg a day, days 1 through 10 each month. Maintain menstrual calendar monitoring. Return to [place], [place]  Brayton Mars, MD  Note: This dictation was prepared with Dragon dictation along with smaller phrase technology. Any transcriptional errors that result from this process are unintentional.

## 2018-01-11 ENCOUNTER — Encounter: Payer: Self-pay | Admitting: Obstetrics and Gynecology

## 2018-01-11 ENCOUNTER — Ambulatory Visit (INDEPENDENT_AMBULATORY_CARE_PROVIDER_SITE_OTHER): Payer: 59 | Admitting: Obstetrics and Gynecology

## 2018-01-11 VITALS — BP 114/73 | HR 70 | Ht 63.0 in | Wt 293.4 lb

## 2018-01-11 DIAGNOSIS — Z6841 Body Mass Index (BMI) 40.0 and over, adult: Secondary | ICD-10-CM | POA: Diagnosis not present

## 2018-01-11 DIAGNOSIS — N8501 Benign endometrial hyperplasia: Secondary | ICD-10-CM

## 2018-01-11 DIAGNOSIS — Z1231 Encounter for screening mammogram for malignant neoplasm of breast: Secondary | ICD-10-CM

## 2018-01-11 DIAGNOSIS — Z1211 Encounter for screening for malignant neoplasm of colon: Secondary | ICD-10-CM | POA: Diagnosis not present

## 2018-01-11 DIAGNOSIS — Z01419 Encounter for gynecological examination (general) (routine) without abnormal findings: Secondary | ICD-10-CM | POA: Diagnosis not present

## 2018-01-11 DIAGNOSIS — Z1239 Encounter for other screening for malignant neoplasm of breast: Secondary | ICD-10-CM

## 2018-01-11 MED ORDER — MEDROXYPROGESTERONE ACETATE 10 MG PO TABS: 10.0000 mg | ORAL_TABLET | Freq: Every day | ORAL | 11 refills | Status: DC

## 2018-01-11 NOTE — Patient Instructions (Signed)
1.  Pap smear is not done.  Next Pap smear is due 2021 2.  Mammogram is ordered 3.  Screening labs are done through Dr. Doy Hutching 4.  Stool guaiac cards are being done through Dr. Doy Hutching 5.  Continue with Provera 10 mg a day days 1 through 10 each month and maintain menstrual calendar monitoring for any abnormal uterine bleeding 6.  Continue with calcium and vitamin D supplementation daily 7.  Continue with healthy eating and exercise and attempted weight loss 8.  Return in 1 year for annual physical   Health Maintenance for Postmenopausal Women Menopause is a normal process in which your reproductive ability comes to an end. This process happens gradually over a span of months to years, usually between the ages of 33 and 75. Menopause is complete when you have missed 12 consecutive menstrual periods. It is important to talk with your health care provider about some of the most common conditions that affect postmenopausal women, such as heart disease, cancer, and bone loss (osteoporosis). Adopting a healthy lifestyle and getting preventive care can help to promote your health and wellness. Those actions can also lower your chances of developing some of these common conditions. What should I know about menopause? During menopause, you may experience a number of symptoms, such as:  Moderate-to-severe hot flashes.  Night sweats.  Decrease in sex drive.  Mood swings.  Headaches.  Tiredness.  Irritability.  Memory problems.  Insomnia.  Choosing to treat or not to treat menopausal changes is an individual decision that you make with your health care provider. What should I know about hormone replacement therapy and supplements? Hormone therapy products are effective for treating symptoms that are associated with menopause, such as hot flashes and night sweats. Hormone replacement carries certain risks, especially as you become older. If you are thinking about using estrogen or estrogen  with progestin treatments, discuss the benefits and risks with your health care provider. What should I know about heart disease and stroke? Heart disease, heart attack, and stroke become more likely as you age. This may be due, in part, to the hormonal changes that your body experiences during menopause. These can affect how your body processes dietary fats, triglycerides, and cholesterol. Heart attack and stroke are both medical emergencies. There are many things that you can do to help prevent heart disease and stroke:  Have your blood pressure checked at least every 1-2 years. High blood pressure causes heart disease and increases the risk of stroke.  If you are 73-43 years old, ask your health care provider if you should take aspirin to prevent a heart attack or a stroke.  Do not use any tobacco products, including cigarettes, chewing tobacco, or electronic cigarettes. If you need help quitting, ask your health care provider.  It is important to eat a healthy diet and maintain a healthy weight. ? Be sure to include plenty of vegetables, fruits, low-fat dairy products, and lean protein. ? Avoid eating foods that are high in solid fats, added sugars, or salt (sodium).  Get regular exercise. This is one of the most important things that you can do for your health. ? Try to exercise for at least 150 minutes each week. The type of exercise that you do should increase your heart rate and make you sweat. This is known as moderate-intensity exercise. ? Try to do strengthening exercises at least twice each week. Do these in addition to the moderate-intensity exercise.  Know your numbers.Ask your health care  provider to check your cholesterol and your blood glucose. Continue to have your blood tested as directed by your health care provider.  What should I know about cancer screening? There are several types of cancer. Take the following steps to reduce your risk and to catch any cancer development  as early as possible. Breast Cancer  Practice breast self-awareness. ? This means understanding how your breasts normally appear and feel. ? It also means doing regular breast self-exams. Let your health care provider know about any changes, no matter how small.  If you are 37 or older, have a clinician do a breast exam (clinical breast exam or CBE) every year. Depending on your age, family history, and medical history, it may be recommended that you also have a yearly breast X-ray (mammogram).  If you have a family history of breast cancer, talk with your health care provider about genetic screening.  If you are at high risk for breast cancer, talk with your health care provider about having an MRI and a mammogram every year.  Breast cancer (BRCA) gene test is recommended for women who have family members with BRCA-related cancers. Results of the assessment will determine the need for genetic counseling and BRCA1 and for BRCA2 testing. BRCA-related cancers include these types: ? Breast. This occurs in males or females. ? Ovarian. ? Tubal. This may also be called fallopian tube cancer. ? Cancer of the abdominal or pelvic lining (peritoneal cancer). ? Prostate. ? Pancreatic.  Cervical, Uterine, and Ovarian Cancer Your health care provider may recommend that you be screened regularly for cancer of the pelvic organs. These include your ovaries, uterus, and vagina. This screening involves a pelvic exam, which includes checking for microscopic changes to the surface of your cervix (Pap test).  For women ages 21-65, health care providers may recommend a pelvic exam and a Pap test every three years. For women ages 52-65, they may recommend the Pap test and pelvic exam, combined with testing for human papilloma virus (HPV), every five years. Some types of HPV increase your risk of cervical cancer. Testing for HPV may also be done on women of any age who have unclear Pap test results.  Other health  care providers may not recommend any screening for nonpregnant women who are considered low risk for pelvic cancer and have no symptoms. Ask your health care provider if a screening pelvic exam is right for you.  If you have had past treatment for cervical cancer or a condition that could lead to cancer, you need Pap tests and screening for cancer for at least 20 years after your treatment. If Pap tests have been discontinued for you, your risk factors (such as having a new sexual partner) need to be reassessed to determine if you should start having screenings again. Some women have medical problems that increase the chance of getting cervical cancer. In these cases, your health care provider may recommend that you have screening and Pap tests more often.  If you have a family history of uterine cancer or ovarian cancer, talk with your health care provider about genetic screening.  If you have vaginal bleeding after reaching menopause, tell your health care provider.  There are currently no reliable tests available to screen for ovarian cancer.  Lung Cancer Lung cancer screening is recommended for adults 29-24 years old who are at high risk for lung cancer because of a history of smoking. A yearly low-dose CT scan of the lungs is recommended if you:  Currently smoke.  Have a history of at least 30 pack-years of smoking and you currently smoke or have quit within the past 15 years. A pack-year is smoking an average of one pack of cigarettes per day for one year.  Yearly screening should:  Continue until it has been 15 years since you quit.  Stop if you develop a health problem that would prevent you from having lung cancer treatment.  Colorectal Cancer  This type of cancer can be detected and can often be prevented.  Routine colorectal cancer screening usually begins at age 71 and continues through age 74.  If you have risk factors for colon cancer, your health care provider may  recommend that you be screened at an earlier age.  If you have a family history of colorectal cancer, talk with your health care provider about genetic screening.  Your health care provider may also recommend using home test kits to check for hidden blood in your stool.  A small camera at the end of a tube can be used to examine your colon directly (sigmoidoscopy or colonoscopy). This is done to check for the earliest forms of colorectal cancer.  Direct examination of the colon should be repeated every 5-10 years until age 3. However, if early forms of precancerous polyps or small growths are found or if you have a family history or genetic risk for colorectal cancer, you may need to be screened more often.  Skin Cancer  Check your skin from head to toe regularly.  Monitor any moles. Be sure to tell your health care provider: ? About any new moles or changes in moles, especially if there is a change in a mole's shape or color. ? If you have a mole that is larger than the size of a pencil eraser.  If any of your family members has a history of skin cancer, especially at a young age, talk with your health care provider about genetic screening.  Always use sunscreen. Apply sunscreen liberally and repeatedly throughout the day.  Whenever you are outside, protect yourself by wearing long sleeves, pants, a wide-brimmed hat, and sunglasses.  What should I know about osteoporosis? Osteoporosis is a condition in which bone destruction happens more quickly than new bone creation. After menopause, you may be at an increased risk for osteoporosis. To help prevent osteoporosis or the bone fractures that can happen because of osteoporosis, the following is recommended:  If you are 58-64 years old, get at least 1,000 mg of calcium and at least 600 mg of vitamin D per day.  If you are older than age 12 but younger than age 29, get at least 1,200 mg of calcium and at least 600 mg of vitamin D per  day.  If you are older than age 42, get at least 1,200 mg of calcium and at least 800 mg of vitamin D per day.  Smoking and excessive alcohol intake increase the risk of osteoporosis. Eat foods that are rich in calcium and vitamin D, and do weight-bearing exercises several times each week as directed by your health care provider. What should I know about how menopause affects my mental health? Depression may occur at any age, but it is more common as you become older. Common symptoms of depression include:  Low or sad mood.  Changes in sleep patterns.  Changes in appetite or eating patterns.  Feeling an overall lack of motivation or enjoyment of activities that you previously enjoyed.  Frequent crying spells.  Talk with your health care provider if you think that you are experiencing depression. What should I know about immunizations? It is important that you get and maintain your immunizations. These include:  Tetanus, diphtheria, and pertussis (Tdap) booster vaccine.  Influenza every year before the flu season begins.  Pneumonia vaccine.  Shingles vaccine.  Your health care provider may also recommend other immunizations. This information is not intended to replace advice given to you by your health care provider. Make sure you discuss any questions you have with your health care provider. Document Released: 12/23/2005 Document Revised: 05/20/2016 Document Reviewed: 08/04/2015 Elsevier Interactive Patient Education  2018 [employer].

## 2018-02-07 DIAGNOSIS — E039 Hypothyroidism, unspecified: Secondary | ICD-10-CM | POA: Diagnosis not present

## 2018-02-07 DIAGNOSIS — G4733 Obstructive sleep apnea (adult) (pediatric): Secondary | ICD-10-CM | POA: Diagnosis not present

## 2018-02-07 DIAGNOSIS — I10 Essential (primary) hypertension: Secondary | ICD-10-CM | POA: Diagnosis not present

## 2018-02-07 DIAGNOSIS — R739 Hyperglycemia, unspecified: Secondary | ICD-10-CM | POA: Diagnosis not present

## 2018-04-19 ENCOUNTER — Ambulatory Visit (INDEPENDENT_AMBULATORY_CARE_PROVIDER_SITE_OTHER): Payer: Self-pay | Admitting: Family Medicine

## 2018-04-19 ENCOUNTER — Encounter: Payer: Self-pay | Admitting: Family Medicine

## 2018-04-19 VITALS — BP 120/70 | HR 70 | Temp 97.7°F | Wt 285.0 lb

## 2018-04-19 DIAGNOSIS — L249 Irritant contact dermatitis, unspecified cause: Secondary | ICD-10-CM

## 2018-04-19 MED ORDER — HYDROXYZINE HCL 25 MG PO TABS: 12.5000 mg | ORAL_TABLET | Freq: Three times a day (TID) | ORAL | 0 refills | Status: DC | PRN

## 2018-04-19 MED ORDER — FAMOTIDINE 20 MG PO TABS
20.0000 mg | ORAL_TABLET | Freq: Two times a day (BID) | ORAL | 0 refills | Status: DC
Start: 2018-04-19 — End: 2021-01-20

## 2018-04-19 MED ORDER — BETAMETHASONE DIPROPIONATE 0.05 % EX OINT: TOPICAL_OINTMENT | Freq: Two times a day (BID) | CUTANEOUS | 0 refills | Status: AC

## 2018-04-19 NOTE — Progress Notes (Signed)
Patient ID: Rebekah Perkins, female    DOB: 1956-01-31, 62 y.o.   MRN: [id number]  PCP: Rebekah Crouch, MD  No chief complaint on file.   Subjective:  HPI Rebekah Perkins is a 62 y.o. female presents for evaluation of suspect rash secondary to poison oak exposure. Medical problems significant for hypertension, hyperglycemia, morbid obesity. Onset of rash occurred initially over 1 week ago. She was treated with a course of prednisone and reports rash improved although never completely resolved. She was working outside again a few days prior and came in contact with more poison oak. The rash is now occurring on bilateral legs and small rash on arms. She has attempted relief with OTC benadryl topical application with only mild relief of itching. She denies any irritation around the eyes, mouth, ears, or genital area. She has not experiences any drainage from papular areas of rash. She has remained afebrile and negative of chills. Social History   Socioeconomic History  . Marital status: Single    Spouse name: Not on file  . Number of children: Not on file  . Years of education: Not on file  . Highest education level: Not on file  Occupational History  . Not on file  Social Needs  . Financial resource strain: Not on file  . Food insecurity:    Worry: Not on file    Inability: Not on file  . Transportation needs:    Medical: Not on file    Non-medical: Not on file  Tobacco Use  . Smoking status: Passive Smoke Exposure - Never Smoker  . Smokeless tobacco: Never Used  Substance and Sexual Activity  . Alcohol use: No  . Drug use: No  . Sexual activity: Never  Lifestyle  . Physical activity:    Days per week: 0 days    Minutes per session: 0 min  . Stress: Not on file  Relationships  . Social connections:    Talks on phone: Not on file    Gets together: Not on file    Attends religious service: Not on file    Active member of club or organization: Not on file    Attends  meetings of clubs or organizations: Not on file    Relationship status: Not on file  . Intimate partner violence:    Fear of current or ex partner: Not on file    Emotionally abused: Not on file    Physically abused: Not on file    Forced sexual activity: Not on file  Other Topics Concern  . Not on file  Social History Narrative  . Not on file    Family History  Problem Relation Age of Onset  . Cancer Maternal Grandmother   . Colon cancer Maternal Grandmother   . Breast cancer Maternal Grandmother 80  . Diabetes Mother   . Heart disease Mother   . Diabetes Sister   . Breast cancer Maternal Aunt 60  . Lung cancer Maternal Aunt   . Ovarian cancer Neg Hx    Review of Systems Pertinent negatives listed in HPI  Patient Active Problem List   Diagnosis Date Noted  . Endometrial hyperplasia without atypia, simple 07/21/2015  . Diverticulosis 07/21/2015  . Neuritis or radiculitis due to rupture of lumbar intervertebral disc 11/12/2014  . Lumbar canal stenosis 11/12/2014  . Bursitis, trochanteric 11/12/2014  . Back ache 04/10/2014  . Chronic anemia 04/10/2014  . BP (high blood pressure) 04/10/2014  . Blood glucose elevated 04/10/2014  .  HLD (hyperlipidemia) 04/10/2014  . Adult hypothyroidism 04/10/2014  . Adiposity 04/10/2014  . Obstructive apnea 04/10/2014    Allergies  Allergen Reactions  . Azithromycin Itching  . Penicillins Itching    Prior to Admission medications   Medication Sig Start Date End Date Taking? Authorizing Provider  acetaminophen (TYLENOL) 500 MG tablet Take 1,500 mg by mouth every 6 (six) hours as needed for mild pain or headache.    [provider]  aspirin EC 81 MG tablet Take 81 mg by mouth daily.    [provider]  bisoprolol-hydrochlorothiazide (ZIAC) 5-6.25 MG per tablet Take 1 tablet by mouth daily.    [provider]  calcium carbonate (OS-CAL) 600 MG TABS tablet Take 600 mg by mouth at bedtime.    [provider]  cetirizine (ZYRTEC) 10 MG tablet Take 10 mg by mouth at bedtime.    [provider]  docusate sodium (COLACE) 100 MG capsule Take 100 mg by mouth 2 (two) times daily.    [provider]  ferrous sulfate 325 (65 FE) MG tablet Take 325 mg by mouth at bedtime.    [provider]  fluticasone (FLONASE) 50 MCG/ACT nasal spray Place 2 sprays into both nostrils daily.    [provider]  gabapentin (NEURONTIN) 300 MG capsule Take 300 mg by mouth at bedtime.    [provider]  hydrochlorothiazide (HYDRODIURIL) 25 MG tablet Take 25 mg by mouth daily.    [provider]  levothyroxine (SYNTHROID, LEVOTHROID) 150 MCG tablet TAKE 1 TABLET BY MOUTH ONCE DAILY. TAKE ON AN EMPTY STOMACH WITH A GLASS OF WATER AT LEAST 30-60 MINUTES BEFORE BREAKFAST. 05/09/17   [provider]  medroxyPROGESTERone (PROVERA) 10 MG tablet Take 1 tablet (10 mg total) by mouth daily. Days 1 through 10 01/11/18   Defrancesco, Alanda Slim, MD  pantoprazole (PROTONIX) 40 MG tablet Take by mouth. 04/19/16 01/11/18  [provider]  phentermine (ADIPEX-P) 37.5 MG tablet Take 37.5 mg by mouth daily.    [provider]  traZODone (DESYREL) 50 MG tablet Take by mouth. 04/19/16 01/11/18  [provider]  vitamin B-12 (CYANOCOBALAMIN) 1000 MCG tablet Take 1,000 mcg by mouth daily.    [provider]   Past Medical, Surgical Family and Social History reviewed and updated.   Objective:  There were no vitals filed for this visit.  Wt Readings from Last 3 Encounters:  01/11/18 293 lb 6.4 oz (133.1 kg)  01/06/17 273 lb 12.8 oz (124.2 kg)  07/05/16 281 lb 3.2 oz (127.6 kg)   Physical Exam  Constitutional: She appears well-developed and well-nourished.  HENT:  Head: Normocephalic and atraumatic.  Neck: Normal range of motion. Neck supple.  Cardiovascular: Normal rate, regular rhythm, normal heart sounds and intact distal pulses.   Pulmonary/Chest: Effort normal and breath sounds normal.  Skin: Skin is warm. Rash noted. Rash is maculopapular. There is erythema.  Psychiatric: She has a normal mood and affect. Her behavior is normal. Judgment and thought content normal.   Assessment & Plan:  1. Irritant contact dermatitis, unspecified trigger, secondary to probable poison oak exposure. Patient recently treated for irritation with a course of oral prednisone therefore I will not prescribed another course of oral prednisone due to obesity and history of hyperglycemia. Will trial a course of topical steriodal ointment along with antihistamine to manage itching. If no improvement, patient advised to follow-up with PCP or urgent care to receive a IM injection or steroid.  Meds ordered this encounter  Medications  . hydrOXYzine (ATARAX/VISTARIL) 25 MG tablet    Sig: Take 0.5-1 tablets (12.5-25 mg total) by mouth every 8 (eight) hours as needed for itching.    Dispense:  30 tablet    Refill:  0  . betamethasone dipropionate (DIPROLENE) 0.05 % ointment    Sig: Apply topically 2 (two) times daily for 10 days.    Dispense:  120 g    Refill:  0  . famotidine (PEPCID) 20 MG tablet    Sig: Take 1 tablet (20 mg total) by mouth 2 (two) times daily.    Dispense:  60 tablet    Refill:  0     If symptoms worsen or do not improve, return for follow-up, follow-up with PCP, or at the emergency department if severity of symptoms warrant a higher level of care.     Carroll Sage. Kenton Kingfisher, MSN, FNP-C [facility]  [address] [place], [place] [address] [phone]

## 2018-04-19 NOTE — Patient Instructions (Signed)
Contact Dermatitis Dermatitis is redness, soreness, and swelling (inflammation) of the skin. Contact dermatitis is a reaction to certain substances that touch the skin. You either touched something that irritated your skin, or you have allergies to something you touched. Follow these instructions at home: Skin Care  Moisturize your skin as needed.  Apply cool compresses to the affected areas.  Try taking a bath with: ? Epsom salts. Follow the instructions on the package. You can get these at a pharmacy or grocery store. ? Baking soda. Pour a small amount into the bath as told by your doctor. ? Colloidal oatmeal. Follow the instructions on the package. You can get this at a pharmacy or grocery store.  Try applying baking soda paste to your skin. Stir water into baking soda until it looks like paste.  Do not scratch your skin.  Bathe less often.  Bathe in lukewarm water. Avoid using hot water. Medicines  Take or apply over-the-counter and prescription medicines only as told by your doctor.  If you were prescribed an antibiotic medicine, take or apply your antibiotic as told by your doctor. Do not stop taking the antibiotic even if your condition starts to get better. General instructions  Keep all follow-up visits as told by your doctor. This is important.  Avoid the substance that caused your reaction. If you do not know what caused it, keep a journal to try to track what caused it. Write down: ? What you eat. ? What cosmetic products you use. ? What you drink. ? What you wear in the affected area. This includes jewelry.  If you were given a bandage (dressing), take care of it as told by your doctor. This includes when to change and remove it. Contact a doctor if:  You do not get better with treatment.  Your condition gets worse.  You have signs of infection such as: ? Swelling. ? Tenderness. ? Redness. ? Soreness. ? Warmth.  You have a fever.  You have new  symptoms. Get help right away if:  You have a very bad headache.  You have neck pain.  Your neck is stiff.  You throw up (vomit).  You feel very sleepy.  You see red streaks coming from the affected area.  Your bone or joint underneath the affected area becomes painful after the skin has healed.  The affected area turns darker.  You have trouble breathing. This information is not intended to replace advice given to you by your health care provider. Make sure you discuss any questions you have with your health care provider. Document Released: 08/28/2009 Document Revised: 04/07/2016 Document Reviewed: 03/18/2015 Elsevier Interactive Patient Education  2018 [employer]  

## 2018-04-24 ENCOUNTER — Telehealth: Payer: Self-pay | Admitting: Emergency Medicine

## 2018-04-24 NOTE — Telephone Encounter (Signed)
Spoke with patient stated that she is doing better slowly but surely.

## 2018-05-01 DIAGNOSIS — H524 Presbyopia: Secondary | ICD-10-CM | POA: Diagnosis not present

## 2018-05-07 DIAGNOSIS — R739 Hyperglycemia, unspecified: Secondary | ICD-10-CM | POA: Diagnosis not present

## 2018-05-07 DIAGNOSIS — E7800 Pure hypercholesterolemia, unspecified: Secondary | ICD-10-CM | POA: Diagnosis not present

## 2018-05-07 DIAGNOSIS — I10 Essential (primary) hypertension: Secondary | ICD-10-CM | POA: Diagnosis not present

## 2018-05-07 DIAGNOSIS — Z79899 Other long term (current) drug therapy: Secondary | ICD-10-CM | POA: Diagnosis not present

## 2018-05-08 ENCOUNTER — Ambulatory Visit
Admission: RE | Admit: 2018-05-08 | Discharge: 2018-05-08 | Disposition: A | Discharge status: Home / Self Care | Payer: 59 | Source: Ambulatory Visit | Attending: Obstetrics and Gynecology | Admitting: Obstetrics and Gynecology

## 2018-05-08 DIAGNOSIS — Z1239 Encounter for other screening for malignant neoplasm of breast: Secondary | ICD-10-CM

## 2018-05-08 DIAGNOSIS — Z1231 Encounter for screening mammogram for malignant neoplasm of breast: Secondary | ICD-10-CM | POA: Diagnosis not present

## 2018-06-05 DIAGNOSIS — I10 Essential (primary) hypertension: Secondary | ICD-10-CM | POA: Diagnosis not present

## 2018-06-05 DIAGNOSIS — E039 Hypothyroidism, unspecified: Secondary | ICD-10-CM | POA: Diagnosis not present

## 2018-06-05 DIAGNOSIS — Z0000 Encounter for general adult medical examination without abnormal findings: Secondary | ICD-10-CM | POA: Diagnosis not present

## 2018-06-05 DIAGNOSIS — G4733 Obstructive sleep apnea (adult) (pediatric): Secondary | ICD-10-CM | POA: Diagnosis not present

## 2018-06-05 DIAGNOSIS — R61 Generalized hyperhidrosis: Secondary | ICD-10-CM | POA: Diagnosis not present

## 2018-06-05 DIAGNOSIS — R0989 Other specified symptoms and signs involving the circulatory and respiratory systems: Secondary | ICD-10-CM | POA: Diagnosis not present

## 2018-06-18 DIAGNOSIS — I208 Other forms of angina pectoris: Secondary | ICD-10-CM | POA: Diagnosis not present

## 2018-06-18 DIAGNOSIS — R0602 Shortness of breath: Secondary | ICD-10-CM | POA: Diagnosis not present

## 2018-06-18 DIAGNOSIS — E782 Mixed hyperlipidemia: Secondary | ICD-10-CM | POA: Diagnosis not present

## 2018-06-18 DIAGNOSIS — I10 Essential (primary) hypertension: Secondary | ICD-10-CM | POA: Diagnosis not present

## 2018-06-25 DIAGNOSIS — R0602 Shortness of breath: Secondary | ICD-10-CM | POA: Diagnosis not present

## 2018-06-25 DIAGNOSIS — I208 Other forms of angina pectoris: Secondary | ICD-10-CM | POA: Diagnosis not present

## 2018-07-05 DIAGNOSIS — R0602 Shortness of breath: Secondary | ICD-10-CM | POA: Diagnosis not present

## 2018-07-05 DIAGNOSIS — I208 Other forms of angina pectoris: Secondary | ICD-10-CM | POA: Diagnosis not present

## 2018-07-05 DIAGNOSIS — I10 Essential (primary) hypertension: Secondary | ICD-10-CM | POA: Diagnosis not present

## 2018-07-05 DIAGNOSIS — E782 Mixed hyperlipidemia: Secondary | ICD-10-CM | POA: Diagnosis not present

## 2018-07-30 DIAGNOSIS — M7062 Trochanteric bursitis, left hip: Secondary | ICD-10-CM | POA: Diagnosis not present

## 2018-07-30 DIAGNOSIS — M7061 Trochanteric bursitis, right hip: Secondary | ICD-10-CM | POA: Diagnosis not present

## 2018-07-30 DIAGNOSIS — M5416 Radiculopathy, lumbar region: Secondary | ICD-10-CM | POA: Diagnosis not present

## 2018-07-30 DIAGNOSIS — M48062 Spinal stenosis, lumbar region with neurogenic claudication: Secondary | ICD-10-CM | POA: Diagnosis not present

## 2018-09-06 DIAGNOSIS — Z79899 Other long term (current) drug therapy: Secondary | ICD-10-CM | POA: Diagnosis not present

## 2018-09-06 DIAGNOSIS — I10 Essential (primary) hypertension: Secondary | ICD-10-CM | POA: Diagnosis not present

## 2018-09-13 DIAGNOSIS — E039 Hypothyroidism, unspecified: Secondary | ICD-10-CM | POA: Diagnosis not present

## 2018-09-13 DIAGNOSIS — I10 Essential (primary) hypertension: Secondary | ICD-10-CM | POA: Diagnosis not present

## 2018-09-13 DIAGNOSIS — E782 Mixed hyperlipidemia: Secondary | ICD-10-CM | POA: Diagnosis not present

## 2018-09-13 DIAGNOSIS — G4733 Obstructive sleep apnea (adult) (pediatric): Secondary | ICD-10-CM | POA: Diagnosis not present

## 2018-09-13 DIAGNOSIS — R300 Dysuria: Secondary | ICD-10-CM | POA: Diagnosis not present

## 2018-12-06 DIAGNOSIS — Z872 Personal history of diseases of the skin and subcutaneous tissue: Secondary | ICD-10-CM | POA: Diagnosis not present

## 2018-12-06 DIAGNOSIS — Z86018 Personal history of other benign neoplasm: Secondary | ICD-10-CM | POA: Diagnosis not present

## 2018-12-06 DIAGNOSIS — Z1283 Encounter for screening for malignant neoplasm of skin: Secondary | ICD-10-CM | POA: Diagnosis not present

## 2018-12-06 DIAGNOSIS — L578 Other skin changes due to chronic exposure to nonionizing radiation: Secondary | ICD-10-CM | POA: Diagnosis not present

## 2018-12-21 DIAGNOSIS — E039 Hypothyroidism, unspecified: Secondary | ICD-10-CM | POA: Diagnosis not present

## 2018-12-21 DIAGNOSIS — Z79899 Other long term (current) drug therapy: Secondary | ICD-10-CM | POA: Diagnosis not present

## 2018-12-21 DIAGNOSIS — R739 Hyperglycemia, unspecified: Secondary | ICD-10-CM | POA: Diagnosis not present

## 2018-12-21 DIAGNOSIS — R8290 Unspecified abnormal findings in urine: Secondary | ICD-10-CM | POA: Diagnosis not present

## 2018-12-21 DIAGNOSIS — E782 Mixed hyperlipidemia: Secondary | ICD-10-CM | POA: Diagnosis not present

## 2018-12-21 DIAGNOSIS — I10 Essential (primary) hypertension: Secondary | ICD-10-CM | POA: Diagnosis not present

## 2019-01-16 ENCOUNTER — Ambulatory Visit (INDEPENDENT_AMBULATORY_CARE_PROVIDER_SITE_OTHER): Payer: 59 | Admitting: Obstetrics and Gynecology

## 2019-01-16 ENCOUNTER — Encounter: Payer: 59 | Admitting: Obstetrics and Gynecology

## 2019-01-16 ENCOUNTER — Encounter: Payer: Self-pay | Admitting: Obstetrics and Gynecology

## 2019-01-16 VITALS — BP 113/72 | HR 71 | Ht 63.0 in | Wt 272.8 lb

## 2019-01-16 DIAGNOSIS — Z6841 Body Mass Index (BMI) 40.0 and over, adult: Secondary | ICD-10-CM | POA: Diagnosis not present

## 2019-01-16 DIAGNOSIS — Z1239 Encounter for other screening for malignant neoplasm of breast: Secondary | ICD-10-CM

## 2019-01-16 DIAGNOSIS — R39198 Other difficulties with micturition: Secondary | ICD-10-CM

## 2019-01-16 DIAGNOSIS — Z01419 Encounter for gynecological examination (general) (routine) without abnormal findings: Secondary | ICD-10-CM | POA: Diagnosis not present

## 2019-01-16 LAB — POCT URINALYSIS DIPSTICK
Bilirubin, UA: NEGATIVE
GLUCOSE UA: NEGATIVE
Ketones, UA: NEGATIVE
LEUKOCYTES UA: NEGATIVE
NITRITE UA: NEGATIVE
Protein, UA: NEGATIVE
Spec Grav, UA: 1.01 (ref 1.010–1.025)
Urobilinogen, UA: 0.2 E.U./dL
pH, UA: 6.5 (ref 5.0–8.0)

## 2019-01-16 MED ORDER — MEDROXYPROGESTERONE ACETATE 10 MG PO TABS: 10.0000 mg | ORAL_TABLET | Freq: Every day | ORAL | 11 refills | Status: DC

## 2019-01-16 NOTE — Addendum Note (Signed)
Addended by: Durwin Glaze on: 01/16/2019 11:01 AM   Modules accepted: Orders

## 2019-01-16 NOTE — Progress Notes (Signed)
Patient come sin today for her annual exam. She is having slow urine. Udip shows trace of blood in urine. No other concerns today.

## 2019-01-16 NOTE — Progress Notes (Signed)
HPI:      [name] is a 63 y.o. [id number] who LMP was No LMP recorded. Patient is postmenopausal.  Subjective:   She presents today for her annual examination.  She complains of 60-month history of urgency and frequency of urination. She denies vaginal bleeding-no progesterone withdrawal. She says that she is going to the gym 3 times per week- has lost approximately 20 pounds since last year. Followed by Dr. Doy Hutching for general medical care.    Hx: The following portions of the patient's history were reviewed and updated as appropriate:             She  has a past medical history of Anxiety, Apnea, Diverticulitis, Environmental allergies, Hypertension, Menopausal state, Obesity, PMB (postmenopausal bleeding), PVD (peripheral vascular disease) ([place]), Rosacea, Simple endometrial hyperplasia, Thyroid disease, and Urinary urgency. She does not have any pertinent problems on file. She  has a past surgical history that includes Dilation and curettage of uterus; Hysteroscopy; and Cholecystectomy. Her family history includes Breast cancer (age of onset: 75) in her maternal aunt; Breast cancer (age of onset: 52) in her maternal grandmother; Cancer in her maternal grandmother; Colon cancer in her maternal grandmother; Diabetes in her mother and sister; Heart disease in her mother; Lung cancer in her maternal aunt. She  reports that she is a non-smoker but has been exposed to tobacco smoke. She has never used smokeless tobacco. She reports that she does not drink alcohol or use drugs. She has a current medication list which includes the following prescription(s): acetaminophen, aspirin ec, bisoprolol-hydrochlorothiazide, calcium carbonate, cetirizine, docusate sodium, ferrous sulfate, fluticasone, gabapentin, hydrochlorothiazide, hydroxyzine, levothyroxine, medroxyprogesterone, phentermine, trazodone, vitamin b-12, famotidine, pantoprazole, and trazodone. She is allergic to azithromycin and  penicillins.       Review of Systems:  Review of Systems  Constitutional: Denied constitutional symptoms, night sweats, recent illness, fatigue, fever, insomnia and weight loss.  Eyes: Denied eye symptoms, eye pain, photophobia, vision change and visual disturbance.  Ears/Nose/Throat/Neck: Denied ear, nose, throat or neck symptoms, hearing loss, nasal discharge, sinus congestion and sore throat.  Cardiovascular: Denied cardiovascular symptoms, arrhythmia, chest pain/pressure, edema, exercise intolerance, orthopnea and palpitations.  Respiratory: Denied pulmonary symptoms, asthma, pleuritic pain, productive sputum, cough, dyspnea and wheezing.  Gastrointestinal: Denied, gastro-esophageal reflux, melena, nausea and vomiting.  Genitourinary: Denied genitourinary symptoms including symptomatic vaginal discharge, pelvic relaxation issues, and urinary complaints.  Musculoskeletal: Denied musculoskeletal symptoms, stiffness, swelling, muscle weakness and myalgia.  Dermatologic: Denied dermatology symptoms, rash and scar.  Neurologic: Denied neurology symptoms, dizziness, headache, neck pain and syncope.  Psychiatric: Denied psychiatric symptoms, anxiety and depression.  Endocrine: Denied endocrine symptoms including hot flashes and night sweats.   Meds:   Current Outpatient Medications on File Prior to Visit  Medication Sig Dispense Refill  . acetaminophen (TYLENOL) 500 MG tablet Take 1,500 mg by mouth every 6 (six) hours as needed for mild pain or headache.    Marland Kitchen aspirin EC 81 MG tablet Take 81 mg by mouth daily.    . bisoprolol-hydrochlorothiazide (ZIAC) 5-6.25 MG per tablet Take 1 tablet by mouth daily.    . calcium carbonate (OS-CAL) 600 MG TABS tablet Take 600 mg by mouth at bedtime.    . cetirizine (ZYRTEC) 10 MG tablet Take 10 mg by mouth at bedtime.    . docusate sodium (COLACE) 100 MG capsule Take 100 mg by mouth 2 (two) times daily.    . ferrous sulfate 325 (65 FE) MG tablet Take 325 mg  by  mouth at bedtime.    . fluticasone (FLONASE) 50 MCG/ACT nasal spray Place 2 sprays into both nostrils daily.    Marland Kitchen gabapentin (NEURONTIN) 300 MG capsule Take 300 mg by mouth at bedtime.    . hydrochlorothiazide (HYDRODIURIL) 25 MG tablet Take 25 mg by mouth daily.    . hydrOXYzine (ATARAX/VISTARIL) 25 MG tablet Take 0.5-1 tablets (12.5-25 mg total) by mouth every 8 (eight) hours as needed for itching. 30 tablet 0  . levothyroxine (SYNTHROID, LEVOTHROID) 150 MCG tablet TAKE 1 TABLET BY MOUTH ONCE DAILY. TAKE ON AN EMPTY STOMACH WITH A GLASS OF WATER AT LEAST 30-60 MINUTES BEFORE BREAKFAST.    Marland Kitchen phentermine (ADIPEX-P) 37.5 MG tablet Take 37.5 mg by mouth daily.    . traZODone (DESYREL) 50 MG tablet   11  . vitamin B-12 (CYANOCOBALAMIN) 1000 MCG tablet Take 1,000 mcg by mouth daily.    . famotidine (PEPCID) 20 MG tablet Take 1 tablet (20 mg total) by mouth 2 (two) times daily. (Patient not taking: Reported on 01/16/2019) 60 tablet 0  . pantoprazole (PROTONIX) 40 MG tablet Take by mouth.    . traZODone (DESYREL) 50 MG tablet Take by mouth.     No current facility-administered medications on file prior to visit.     Objective:     Vitals:   01/16/19 0920  BP: 113/72  Pulse: 71              Physical examination General NAD, Conversant  HEENT Atraumatic; Op clear with mmm.  Normo-cephalic. Pupils reactive. Anicteric sclerae  Thyroid/Neck Smooth without nodularity or enlargement. Normal ROM.  Neck Supple.  Skin No rashes, lesions or ulceration. Normal palpated skin turgor. No nodularity.  Breasts: No masses or discharge.  Symmetric.  No axillary adenopathy.  Lungs: Clear to auscultation.No rales or wheezes. Normal Respiratory effort, no retractions.  Heart: NSR.  No murmurs or rubs appreciated. No periferal edema  Abdomen: Soft.  Non-tender.  No masses.  No HSM. No hernia  Extremities: Moves all appropriately.  Normal ROM for age. No lymphadenopathy.  Neuro: Oriented to PPT.  Normal mood.  Normal affect.     Pelvic:   Vulva: Normal appearance.  No lesions.  Minimal/lack perineal body  Vagina: No lesions or abnormalities noted.  Significantly atrophic  Support: Normal pelvic support.  Urethra No masses tenderness or scarring.  Meatus Normal size without lesions or prolapse.  Cervix: Normal appearance.  No lesions.  Anus: Normal exam.  No lesions.  Perineum: Normal exam.  No lesions.        Bimanual   Uterus: Normal size.  Non-tender.  Mobile.  AV.  Adnexae: No masses.  Non-tender to palpation.  Cul-de-sac: Negative for abnormality.   Exam limited by patient body habitus  Assessment:    [id number] Patient Active Problem List   Diagnosis Date Noted  . Endometrial hyperplasia without atypia, simple 07/21/2015  . Diverticulosis 07/21/2015  . Neuritis or radiculitis due to rupture of lumbar intervertebral disc 11/12/2014  . Lumbar canal stenosis 11/12/2014  . Bursitis, trochanteric 11/12/2014  . Back ache 04/10/2014  . Chronic anemia 04/10/2014  . BP (high blood pressure) 04/10/2014  . Blood glucose elevated 04/10/2014  . HLD (hyperlipidemia) 04/10/2014  . Adult hypothyroidism 04/10/2014  . Adiposity 04/10/2014  . Obstructive apnea 04/10/2014     1. Well woman exam with routine gynecological exam   2. Screening for breast cancer   3. Slow urinary stream   4. Morbid obesity with BMI of 50.0-59.9,  adult [facility])        Plan:            1.  Basic Screening Recommendations The basic screening recommendations for asymptomatic women were discussed with the patient during her visit.  The age-appropriate recommendations were discussed with her and the rational for the tests reviewed.  When I am informed by the patient that another primary care physician has previously obtained the age-appropriate tests and they are up-to-date, only outstanding tests are ordered and referrals given as necessary.  Abnormal results of tests will be discussed with her when all of her results  are completed. Mammogram ordered-Pap next year. 2.  Continue Provera 3.  Continued weight loss encouraged 4.  Urine for C&S-we will contact patient with results Orders Orders Placed This Encounter  Procedures  . MM 3D SCREEN BREAST BILATERAL  . POCT urinalysis dipstick     Meds ordered this encounter  Medications  . medroxyPROGESTERone (PROVERA) 10 MG tablet    Sig: Take 1 tablet (10 mg total) by mouth daily. Days 1 through 10    Dispense:  10 tablet    Refill:  11        F/U  No follow-ups on file.  Finis Bud, M.D. 01/16/2019 9:57 AM

## 2019-01-17 ENCOUNTER — Other Ambulatory Visit: Payer: Self-pay | Admitting: Family Medicine

## 2019-01-18 LAB — URINE CULTURE: ORGANISM ID, BACTERIA: NO GROWTH

## 2019-01-29 ENCOUNTER — Telehealth: Payer: 59 | Admitting: Nurse Practitioner

## 2019-01-29 DIAGNOSIS — J0100 Acute maxillary sinusitis, unspecified: Secondary | ICD-10-CM | POA: Diagnosis not present

## 2019-01-29 MED ORDER — DOXYCYCLINE HYCLATE 100 MG PO TABS: 100.0000 mg | ORAL_TABLET | Freq: Two times a day (BID) | ORAL | 0 refills | Status: DC

## 2019-01-29 NOTE — Progress Notes (Signed)

## 2019-02-19 MED FILL — MEDROXYPROGESTERONE 10 MG T: 10 | 10 days supply | Qty: 10 | Fill #0

## 2019-02-19 MED FILL — FAMOTIDINE 40 MG TABLET: 40 | 30 days supply | Qty: 30 | Fill #0 | Status: TO

## 2019-03-25 MED FILL — MEDROXYPROGESTERONE 10 MG T: 10 | 10 days supply | Qty: 10 | Fill #1

## 2019-03-27 ENCOUNTER — Ambulatory Visit
Admission: RE | Admit: 2019-03-27 | Discharge: 2019-03-27 | Disposition: A | Discharge status: Home / Self Care | Payer: 59 | Source: Ambulatory Visit | Attending: Internal Medicine | Admitting: Internal Medicine

## 2019-03-27 ENCOUNTER — Other Ambulatory Visit: Payer: Self-pay

## 2019-03-27 ENCOUNTER — Other Ambulatory Visit: Payer: Self-pay | Admitting: Internal Medicine

## 2019-03-27 ENCOUNTER — Other Ambulatory Visit (HOSPITAL_COMMUNITY): Payer: Self-pay | Admitting: Internal Medicine

## 2019-03-27 DIAGNOSIS — M79604 Pain in right leg: Secondary | ICD-10-CM

## 2019-03-27 DIAGNOSIS — R739 Hyperglycemia, unspecified: Secondary | ICD-10-CM | POA: Diagnosis not present

## 2019-03-27 DIAGNOSIS — E039 Hypothyroidism, unspecified: Secondary | ICD-10-CM | POA: Diagnosis not present

## 2019-03-27 DIAGNOSIS — R609 Edema, unspecified: Secondary | ICD-10-CM | POA: Diagnosis not present

## 2019-03-27 DIAGNOSIS — M7989 Other specified soft tissue disorders: Secondary | ICD-10-CM | POA: Diagnosis not present

## 2019-03-27 DIAGNOSIS — G4733 Obstructive sleep apnea (adult) (pediatric): Secondary | ICD-10-CM | POA: Diagnosis not present

## 2019-03-27 DIAGNOSIS — I10 Essential (primary) hypertension: Secondary | ICD-10-CM | POA: Diagnosis not present

## 2019-05-13 ENCOUNTER — Other Ambulatory Visit: Payer: Self-pay

## 2019-05-13 ENCOUNTER — Ambulatory Visit (INDEPENDENT_AMBULATORY_CARE_PROVIDER_SITE_OTHER): Payer: Self-pay | Admitting: Physician Assistant

## 2019-05-13 ENCOUNTER — Other Ambulatory Visit: Payer: Self-pay | Admitting: Family Medicine

## 2019-05-13 VITALS — BP 115/72 | HR 69 | Temp 98.1°F | Resp 20 | Wt 272.0 lb

## 2019-05-13 DIAGNOSIS — H01004 Unspecified blepharitis left upper eyelid: Secondary | ICD-10-CM

## 2019-05-13 MED ORDER — BACITRACIN-POLYMYXIN B 500-10000 UNIT/GM OP OINT: 1.0000 "application " | TOPICAL_OINTMENT | Freq: Three times a day (TID) | OPHTHALMIC | 1 refills | Status: DC

## 2019-05-13 NOTE — Patient Instructions (Addendum)
Thank you for choosing [facility] for your health care needs today.  You have been diagnosed with: 1. Blepharitis of left upper eyelid, unspecified type - bacitracin-polymyxin b (POLYSPORIN) ophthalmic ointment; Place 1 application into the left eye 3 (three) times daily.  Dispense: 3.5 g; Refill: 1  May apply very thin layer of Hydrocortisone cream 1%, once a day, to area of rash on left upper eyelid.  Use medications as prescribed.  May apply cool compresses over left eye. Continue allergy medication; Zyrtec and Flonase. Try to not to rub/scratch eye.  Follow-up with eye doctor in 2-3 days if rash not improving. Sooner with any worsening symptoms; including change in vision, eye pain, worsening rash, pain with eye movement, fever/chills, swelling of eyelid, or other new/concerning symptom.  Hope you feel better soon!  Blepharitis Blepharitis is swelling of the eyelids. Symptoms may include:  Reddish, scaly skin around the scalp and eyebrows.  Burning or itching of the eyelids.  Fluid coming from the eye at night. This causes the eyelashes to stick together in the morning.  Eyelashes that fall out.  Being sensitive to light. Follow these instructions at home: Pay attention to any changes in how you look or feel. Tell your health care provider about any changes. Follow these instructions to help with your condition: Keeping clean   Wash your hands often.  Wash your eyelids with warm water, or wash them with warm water that is mixed with little bit of baby shampoo. Do this 2 or more times per day.  Wash your face and eyebrows at least once a day.  Use a clean towel each time you dry your eyelids. Do not use the towel to clean or dry other areas of your body. Do not share your towel with anyone. General instructions  Avoid wearing makeup until you get better. Do not share makeup with anyone.  Avoid rubbing your eyes.  Put a warm compress on your eyes 2 times per day for  10 minutes at a time, or as told by your doctor.  If you were given antibiotics in the form of creams or eye drops, use the medicine as told by your doctor. Do not stop using the medicine even if you feel better.  Keep all follow-up visits as told by your doctor. This is important. Contact a doctor if:  Your eyelids feel hot.  You have blisters on your eyelids.  You have a rash on your eyelids.  The swelling does not go away in 2-4 days.  The swelling gets worse. Get help right away if:  You have pain that gets worse.  You have pain that spreads to other parts of your face.  You have redness that gets worse.  You have redness that spreads to other parts of your face.  Your vision changes.  You have pain when you look at lights or things that move.  You have a fever. Summary  Blepharitis is swelling of the eyelids.  Pay attention to any changes in how your eyes look or feel. Tell your doctor about any changes.  Follow home care instructions as told by your doctor. Wash your hands often. Avoid wearing makeup. Do not rub your eyes.  Use warm compresses, creams, or eye drops as told by your doctor.  Let your doctor know if you have changes in vision, blisters or rash on eyelids, pain that spreads to your face, or warmth on your eyelids. This information is not intended to replace advice given to  you by your health care provider. Make sure you discuss any questions you have with your health care provider. Document Released: 08/09/2008 Document Revised: 04/30/2018 Document Reviewed: 04/30/2018 Elsevier Patient Education  2020 [employer].

## 2019-05-13 NOTE — Progress Notes (Signed)
Patient ID: Rebekah Perkins DOB: March 12, 1956 AGE: 63 y.o. MRN: [id number]   PCP: Idelle Crouch, MD   Chief Complaint:  Chief Complaint  Patient presents with  . Eye Problem    1 DAY, LEFT EYELID, ITCHY, DRY, , SWOLLEN , BURNING FEELING       Subjective:    HPI:  Rebekah Perkins is a 63 y.o. female presents for evaluation  Chief Complaint  Patient presents with  . Eye Problem    1 DAY, LEFT EYELID, ITCHY, DRY, , SWOLLEN , BURNING FEELING     63 year old female presents to [facility] with several week history of rash on left upper eyelid. Began midline. Faint erythema and small bump. Over the course of the past several weeks has spread medially. Feels dry. Occasional flaky skin. Burns when applying over the counter Neomycin ointment. Denies known precipitating factor; new makeup, moisturizing, glasses, etc. Denies previous history of similar rash. No previous history of blepharitis or stye. Patient wears glasses for presbyopia. No use of contact lenses. Denies change in vision, foreign body sensation, purulent drainage, eyeball pain/discomfort, photosensitivity, pain with eye movement. Denies fever, chills, headache, body aches, sinus pain/pressure, sore throat, cough. Patient with allergies, year round, typically managed well with Zyrtec and Flonase use daily. Denies significant eye erythema, pruritis, or watering due to allergies; has never used allergy eye drops. Denies history of sensitive skin or eczema. Patient with hypothyroidism; states well controlled. Patient with OSA, uses CPAP, no recent change in mask.  A limited review of symptoms was performed, pertinent positives and negatives as mentioned in HPI.  The following portions of the patient's history were reviewed and updated as appropriate: allergies, current medications and past medical history.  Patient Active Problem List   Diagnosis Date Noted  . Endometrial hyperplasia without atypia, simple 07/21/2015   . Diverticulosis 07/21/2015  . Neuritis or radiculitis due to rupture of lumbar intervertebral disc 11/12/2014  . Lumbar canal stenosis 11/12/2014  . Bursitis, trochanteric 11/12/2014  . Back ache 04/10/2014  . Chronic anemia 04/10/2014  . BP (high blood pressure) 04/10/2014  . Blood glucose elevated 04/10/2014  . HLD (hyperlipidemia) 04/10/2014  . Adult hypothyroidism 04/10/2014  . Adiposity 04/10/2014  . Obstructive apnea 04/10/2014    Allergies  Allergen Reactions  . Azithromycin Itching  . Penicillins Itching    Current Outpatient Medications on File Prior to Visit  Medication Sig Dispense Refill  . acetaminophen (TYLENOL) 500 MG tablet Take 1,500 mg by mouth every 6 (six) hours as needed for mild pain or headache.    Marland Kitchen aspirin EC 81 MG tablet Take 81 mg by mouth daily.    . calcium carbonate (OS-CAL) 600 MG TABS tablet Take 600 mg by mouth at bedtime.    . cetirizine (ZYRTEC) 10 MG tablet Take 10 mg by mouth at bedtime.    . docusate sodium (COLACE) 100 MG capsule Take 100 mg by mouth 2 (two) times daily.    . famotidine (PEPCID) 20 MG tablet Take 1 tablet (20 mg total) by mouth 2 (two) times daily. 60 tablet 0  . ferrous sulfate 325 (65 FE) MG tablet Take 325 mg by mouth at bedtime.    . fluticasone (FLONASE) 50 MCG/ACT nasal spray Place 2 sprays into both nostrils daily.    Marland Kitchen gabapentin (NEURONTIN) 300 MG capsule Take 300 mg by mouth at bedtime.    . hydrochlorothiazide (HYDRODIURIL) 25 MG tablet Take 25 mg by mouth daily.    Marland Kitchen  hydrOXYzine (ATARAX/VISTARIL) 25 MG tablet Take 0.5-1 tablets (12.5-25 mg total) by mouth every 8 (eight) hours as needed for itching. 30 tablet 0  . levothyroxine (SYNTHROID, LEVOTHROID) 150 MCG tablet TAKE 1 TABLET BY MOUTH ONCE DAILY. TAKE ON AN EMPTY STOMACH WITH A GLASS OF WATER AT LEAST 30-60 MINUTES BEFORE BREAKFAST.    . medroxyPROGESTERone (PROVERA) 10 MG tablet Take 1 tablet (10 mg total) by mouth daily. Days 1 through 10 10 tablet 11  .  phentermine (ADIPEX-P) 37.5 MG tablet Take 37.5 mg by mouth daily.    . traZODone (DESYREL) 50 MG tablet   11  . vitamin B-12 (CYANOCOBALAMIN) 1000 MCG tablet Take 1,000 mcg by mouth daily.    . bisoprolol-hydrochlorothiazide (ZIAC) 5-6.25 MG per tablet Take 1 tablet by mouth daily.    Marland Kitchen doxycycline (VIBRA-TABS) 100 MG tablet Take 1 tablet (100 mg total) by mouth 2 (two) times daily. 1 po bid (Patient not taking: Reported on 05/13/2019) 20 tablet 0  . pantoprazole (PROTONIX) 40 MG tablet Take by mouth.    . traZODone (DESYREL) 50 MG tablet Take by mouth.     No current facility-administered medications on file prior to visit.        Objective:   Vitals:   05/13/19 1334  BP: 115/72  Pulse: 69  Resp: 20  Temp: 98.1 F (36.7 C)  SpO2: 97%     Wt Readings from Last 3 Encounters:  05/13/19 272 lb (123.4 kg)  01/16/19 272 lb 12.8 oz (123.7 kg)  04/19/18 285 lb (129.3 kg)    Physical Exam:  Visual acuity: Right 20/30 Left 20/60 Both 20/30 Without corrective lenses   General Appearance:  Patient sitting comfortably on examination table. Conversational. Kermit Balo self-historian. In no acute distress. Afebrile.   Head:  Normocephalic, without obvious abnormality, atraumatic  Eyes:  PERRLA. EOMI; no pain with eye movement. Conjunctiva clear; right eye with mild injection. No significant erythema. No chemosis. No perilimbal flushing. Right eyelids, upper and lower, normal. Left lower eyelid normal. Left upper eyelid reveals 2cm in diameter circular area of faint erythema. Dry skin with flaking. Crack at medial portion. No active discharge/bleeding. Very minimal edema. No ecchymosis. No streaking redness. No palpable warmth. No palpable underlying mass/abscess. No associated stye/hordeolum/chalazion.   Ears:  Left ear canal WNL. No erythema or edema. No open wound. No visible purulent drainage. No tenderness with palpation over left tragus or with manipulation of left auricle. No visible  erythema or edema of left mastoid. No tenderness with palpation over left mastoid. Right ear canal WNL. No erythema or edema. No open wound. No visible purulent drainage. No tenderness with palpation over right tragus or with manipulation of right auricle. No visible erythema or edema of right mastoid. No tenderness with palpation over right mastoid. Left TM WNL. Good light reflex. Visible landmarks. No erythema. No injection. No bulging or retraction. No visible perforation. No serous effusion. No visible purulent effusion. No tympanostomy tube. No scar tissue. Right TM WNL. Good light reflex. Visible landmarks. No erythema. No injection. No bulging or retraction. No visible perforation. No serous effusion. No visible purulent effusion. No tympanostomy tube. No scar tissue.  Nose: Nares normal. Septum midline. No visible polyps. No discharge. Normal mucosa. No sinus tenderness with percussion/palpation.  Throat: Lips, mucosa, and tongue normal; teeth and gums normal. Throat reveals no erythema. No postnasal drip. Mild cobblestoning of posterior pharynx. Tonsils with no enlargement or exudate. Uvula midline with no edema or erythema.  Neck: Supple,  symmetrical, trachea midline, no adenopathy  Lungs:   Clear to auscultation bilaterally, respirations unlabored. Good aeration. No rales, rhonchi, crackles or wheezing.  Heart:  Regular rate and rhythm, S1 and S2 normal, no murmur, rub, or gallop  Extremities: Extremities normal, atraumatic, no cyanosis or edema  Pulses: 2+ and symmetric  Skin: Skin color, texture, turgor normal, no rashes or lesions  Lymph nodes: Cervical, supraclavicular, and axillary nodes normal  Neurologic: Normal   Picture in Media Section.  Assessment & Plan:    Exam findings, diagnosis etiology and medication use and indications reviewed with patient. Follow-Up and discharge instructions provided. No emergent/urgent issues found on exam.  Patient education was  provided.   Patient verbalized understanding of information provided and agrees with plan of care (POC), all questions answered. The patient is advised to call or return to clinic if condition does not see an improvement in symptoms, or to seek the care of the closest emergency department if condition worsens with the below plan.    1. Blepharitis of left upper eyelid, unspecified type - bacitracin-polymyxin b (POLYSPORIN) ophthalmic ointment; Place 1 application into the left eye 3 (three) times daily.  Dispense: 3.5 g; Refill: 59  63 year old female with several week history of left upper eyelid erythema/dry flaking skin. HPI and appearance/PE most consistent with eyelid dermatitis. Due to duration and worsening, prescribed antibiotic ointment (Polymyxin/Bacitracin). Advised use of OTC Hydrocortisone cream. VSS, afebrile, in no acute distress, no red flag symptoms consistent with periorbital or orbital cellulitis. Patient with well-established ophthalmologist, [place]. Advised follow-up with eye doctor in 2-3 days if not improving; sooner with any worsening symptoms such as change in vision, eyeball pain/pressure, worsening erythema, pain with eye movement, fever/chills as this may be indicative or periorbital/orbital cellulitis, warranting immediate evaluation and treatment at ED. Patient stated understanding. Agreed with plan.   Darlin Priestly, MHS, PA-C Montey Hora, MHS, PA-C Advanced Practice Provider [facility]  [place],  [address] (p): [phone] [email or web address]

## 2019-05-16 ENCOUNTER — Telehealth: Payer: Self-pay | Admitting: Emergency Medicine

## 2019-05-16 NOTE — Telephone Encounter (Signed)
Left message following up on visit with [facility] 

## 2019-05-20 ENCOUNTER — Other Ambulatory Visit: Payer: Self-pay | Admitting: Family Medicine

## 2019-05-30 DIAGNOSIS — H00036 Abscess of eyelid left eye, unspecified eyelid: Secondary | ICD-10-CM | POA: Diagnosis not present

## 2019-06-07 DIAGNOSIS — L239 Allergic contact dermatitis, unspecified cause: Secondary | ICD-10-CM | POA: Diagnosis not present

## 2019-06-07 DIAGNOSIS — I10 Essential (primary) hypertension: Secondary | ICD-10-CM | POA: Diagnosis not present

## 2019-07-01 DIAGNOSIS — E782 Mixed hyperlipidemia: Secondary | ICD-10-CM | POA: Diagnosis not present

## 2019-07-01 DIAGNOSIS — I10 Essential (primary) hypertension: Secondary | ICD-10-CM | POA: Diagnosis not present

## 2019-07-01 DIAGNOSIS — I208 Other forms of angina pectoris: Secondary | ICD-10-CM | POA: Diagnosis not present

## 2019-07-01 DIAGNOSIS — R0602 Shortness of breath: Secondary | ICD-10-CM | POA: Diagnosis not present

## 2019-08-13 ENCOUNTER — Other Ambulatory Visit: Payer: Self-pay

## 2019-08-13 DIAGNOSIS — Z20822 Contact with and (suspected) exposure to covid-19: Secondary | ICD-10-CM

## 2019-08-14 LAB — NOVEL CORONAVIRUS, NAA: SARS-CoV-2, NAA: NOT DETECTED

## 2019-08-19 DIAGNOSIS — I10 Essential (primary) hypertension: Secondary | ICD-10-CM | POA: Diagnosis not present

## 2019-08-19 DIAGNOSIS — E782 Mixed hyperlipidemia: Secondary | ICD-10-CM | POA: Diagnosis not present

## 2019-08-19 DIAGNOSIS — Z79899 Other long term (current) drug therapy: Secondary | ICD-10-CM | POA: Diagnosis not present

## 2019-08-19 DIAGNOSIS — R739 Hyperglycemia, unspecified: Secondary | ICD-10-CM | POA: Diagnosis not present

## 2019-08-26 DIAGNOSIS — Z0000 Encounter for general adult medical examination without abnormal findings: Secondary | ICD-10-CM | POA: Diagnosis not present

## 2019-08-26 DIAGNOSIS — G4733 Obstructive sleep apnea (adult) (pediatric): Secondary | ICD-10-CM | POA: Diagnosis not present

## 2019-08-26 DIAGNOSIS — E039 Hypothyroidism, unspecified: Secondary | ICD-10-CM | POA: Diagnosis not present

## 2019-08-26 DIAGNOSIS — I10 Essential (primary) hypertension: Secondary | ICD-10-CM | POA: Diagnosis not present

## 2019-09-24 ENCOUNTER — Ambulatory Visit
Admission: RE | Admit: 2019-09-24 | Discharge: 2019-09-24 | Disposition: A | Discharge status: Home / Self Care | Payer: 59 | Source: Ambulatory Visit | Attending: Obstetrics and Gynecology | Admitting: Obstetrics and Gynecology

## 2019-09-24 DIAGNOSIS — Z1239 Encounter for other screening for malignant neoplasm of breast: Secondary | ICD-10-CM

## 2019-09-24 DIAGNOSIS — Z1231 Encounter for screening mammogram for malignant neoplasm of breast: Secondary | ICD-10-CM | POA: Diagnosis not present

## 2019-09-26 DIAGNOSIS — M25561 Pain in right knee: Secondary | ICD-10-CM | POA: Diagnosis not present

## 2019-09-26 DIAGNOSIS — M1711 Unilateral primary osteoarthritis, right knee: Secondary | ICD-10-CM | POA: Diagnosis not present

## 2019-09-27 DIAGNOSIS — H524 Presbyopia: Secondary | ICD-10-CM | POA: Diagnosis not present

## 2019-11-26 DIAGNOSIS — E039 Hypothyroidism, unspecified: Secondary | ICD-10-CM | POA: Diagnosis not present

## 2019-11-26 DIAGNOSIS — E782 Mixed hyperlipidemia: Secondary | ICD-10-CM | POA: Diagnosis not present

## 2019-11-26 DIAGNOSIS — G4733 Obstructive sleep apnea (adult) (pediatric): Secondary | ICD-10-CM | POA: Diagnosis not present

## 2019-11-26 DIAGNOSIS — I10 Essential (primary) hypertension: Secondary | ICD-10-CM | POA: Diagnosis not present

## 2019-12-03 DIAGNOSIS — M7522 Bicipital tendinitis, left shoulder: Secondary | ICD-10-CM | POA: Diagnosis not present

## 2019-12-03 DIAGNOSIS — Z6841 Body Mass Index (BMI) 40.0 and over, adult: Secondary | ICD-10-CM | POA: Insufficient documentation

## 2019-12-03 DIAGNOSIS — M7542 Impingement syndrome of left shoulder: Secondary | ICD-10-CM | POA: Diagnosis not present

## 2019-12-03 DIAGNOSIS — M7582 Other shoulder lesions, left shoulder: Secondary | ICD-10-CM | POA: Diagnosis not present

## 2019-12-03 DIAGNOSIS — M25512 Pain in left shoulder: Secondary | ICD-10-CM | POA: Diagnosis not present

## 2019-12-10 DIAGNOSIS — Z86018 Personal history of other benign neoplasm: Secondary | ICD-10-CM | POA: Diagnosis not present

## 2019-12-10 DIAGNOSIS — Z872 Personal history of diseases of the skin and subcutaneous tissue: Secondary | ICD-10-CM | POA: Diagnosis not present

## 2019-12-10 DIAGNOSIS — L578 Other skin changes due to chronic exposure to nonionizing radiation: Secondary | ICD-10-CM | POA: Diagnosis not present

## 2020-01-17 ENCOUNTER — Encounter: Payer: 59 | Admitting: Obstetrics and Gynecology

## 2020-01-21 ENCOUNTER — Encounter: Payer: 59 | Admitting: Obstetrics and Gynecology

## 2020-01-23 ENCOUNTER — Other Ambulatory Visit: Payer: Self-pay

## 2020-01-23 ENCOUNTER — Encounter: Payer: Self-pay | Admitting: Obstetrics and Gynecology

## 2020-01-23 ENCOUNTER — Other Ambulatory Visit (HOSPITAL_COMMUNITY)
Admission: RE | Admit: 2020-01-23 | Discharge: 2020-01-23 | Disposition: A | Discharge status: Home / Self Care | Payer: 59 | Source: Ambulatory Visit | Attending: Obstetrics and Gynecology | Admitting: Obstetrics and Gynecology

## 2020-01-23 ENCOUNTER — Ambulatory Visit (INDEPENDENT_AMBULATORY_CARE_PROVIDER_SITE_OTHER): Payer: 59 | Admitting: Obstetrics and Gynecology

## 2020-01-23 VITALS — BP 118/79 | HR 65 | Ht 63.0 in | Wt 271.0 lb

## 2020-01-23 DIAGNOSIS — Z1231 Encounter for screening mammogram for malignant neoplasm of breast: Secondary | ICD-10-CM

## 2020-01-23 DIAGNOSIS — Z124 Encounter for screening for malignant neoplasm of cervix: Secondary | ICD-10-CM

## 2020-01-23 DIAGNOSIS — Z6841 Body Mass Index (BMI) 40.0 and over, adult: Secondary | ICD-10-CM | POA: Diagnosis not present

## 2020-01-23 DIAGNOSIS — Z01419 Encounter for gynecological examination (general) (routine) without abnormal findings: Secondary | ICD-10-CM

## 2020-01-23 NOTE — Progress Notes (Signed)
HPI:      [name] is a 64 y.o. [phone] who LMP was No LMP recorded. Patient is postmenopausal.  Subjective:   She presents today for her annual examination.  She has no complaints.  She continues to take 10 days of Provera monthly but has no bleeding with withdrawal. Her weight loss efforts discussed last year have stalled at this point.  She sees Dr. Doy Perkins regularly for her routine medical care she says she is doing well.    Hx: The following portions of the patient's history were reviewed and updated as appropriate:             She  has a past medical history of Anxiety, Apnea, Diverticulitis, Environmental allergies, Hypertension, Menopausal state, Obesity, PMB (postmenopausal bleeding), PVD (peripheral vascular disease) ([place]), Rosacea, Simple endometrial hyperplasia, Thyroid disease, and Urinary urgency. She does not have any pertinent problems on file. She  has a past surgical history that includes Dilation and curettage of uterus; Hysteroscopy; and Cholecystectomy. Her family history includes Breast cancer in her sister; Breast cancer (age of onset: 6) in her maternal aunt; Breast cancer (age of onset: 76) in her maternal grandmother; Cancer in her maternal grandmother and sister; Colon cancer in her maternal grandmother; Diabetes in her mother and sister; Heart disease in her mother; Lung cancer in her maternal aunt. She  reports that she is a non-smoker but has been exposed to tobacco smoke. She has never used smokeless tobacco. She reports that she does not drink alcohol or use drugs. She has a current medication list which includes the following prescription(s): acetaminophen, aspirin ec, bisoprolol-hydrochlorothiazide, calcium carbonate, cetirizine, docusate sodium, famotidine, ferrous sulfate, fluticasone, gabapentin, hydrochlorothiazide, levothyroxine, medroxyprogesterone, phentermine, trazodone, vitamin b-12, bacitracin-polymyxin b, doxycycline, hydroxyzine, pantoprazole,  and trazodone. She is allergic to azithromycin and penicillins.       Review of Systems:  Review of Systems  Constitutional: Denied constitutional symptoms, night sweats, recent illness, fatigue, fever, insomnia and weight loss.  Eyes: Denied eye symptoms, eye pain, photophobia, vision change and visual disturbance.  Ears/Nose/Throat/Neck: Denied ear, nose, throat or neck symptoms, hearing loss, nasal discharge, sinus congestion and sore throat.  Cardiovascular: Denied cardiovascular symptoms, arrhythmia, chest pain/pressure, edema, exercise intolerance, orthopnea and palpitations.  Respiratory: Denied pulmonary symptoms, asthma, pleuritic pain, productive sputum, cough, dyspnea and wheezing.  Gastrointestinal: Denied, gastro-esophageal reflux, melena, nausea and vomiting.  Genitourinary: Denied genitourinary symptoms including symptomatic vaginal discharge, pelvic relaxation issues, and urinary complaints.  Musculoskeletal: Denied musculoskeletal symptoms, stiffness, swelling, muscle weakness and myalgia.  Dermatologic: Denied dermatology symptoms, rash and scar.  Neurologic: Denied neurology symptoms, dizziness, headache, neck pain and syncope.  Psychiatric: Denied psychiatric symptoms, anxiety and depression.  Endocrine: Denied endocrine symptoms including hot flashes and night sweats.   Meds:   Current Outpatient Medications on File Prior to Visit  Medication Sig Dispense Refill  . acetaminophen (TYLENOL) 500 MG tablet Take 1,500 mg by mouth every 6 (six) hours as needed for mild pain or headache.    Marland Kitchen aspirin EC 81 MG tablet Take 81 mg by mouth daily.    . bisoprolol-hydrochlorothiazide (ZIAC) 5-6.25 MG per tablet Take 1 tablet by mouth daily.    . calcium carbonate (OS-CAL) 600 MG TABS tablet Take 600 mg by mouth at bedtime.    . cetirizine (ZYRTEC) 10 MG tablet Take 10 mg by mouth at bedtime.    . docusate sodium (COLACE) 100 MG capsule Take 100 mg by mouth 2 (two) times daily.     Marland Kitchen  famotidine (PEPCID) 20 MG tablet Take 1 tablet (20 mg total) by mouth 2 (two) times daily. 60 tablet 0  . ferrous sulfate 325 (65 FE) MG tablet Take 325 mg by mouth at bedtime.    . fluticasone (FLONASE) 50 MCG/ACT nasal spray Place 2 sprays into both nostrils daily.    Marland Kitchen gabapentin (NEURONTIN) 300 MG capsule Take 300 mg by mouth at bedtime.    . hydrochlorothiazide (HYDRODIURIL) 25 MG tablet Take 25 mg by mouth daily.    Marland Kitchen levothyroxine (SYNTHROID, LEVOTHROID) 150 MCG tablet TAKE 1 TABLET BY MOUTH ONCE DAILY. TAKE ON AN EMPTY STOMACH WITH A GLASS OF WATER AT LEAST 30-60 MINUTES BEFORE BREAKFAST.    . medroxyPROGESTERone (PROVERA) 10 MG tablet Take 1 tablet (10 mg total) by mouth daily. Days 1 through 10 10 tablet 11  . phentermine (ADIPEX-P) 37.5 MG tablet Take 37.5 mg by mouth daily.    . traZODone (DESYREL) 50 MG tablet   11  . vitamin B-12 (CYANOCOBALAMIN) 1000 MCG tablet Take 1,000 mcg by mouth daily.    . bacitracin-polymyxin b (POLYSPORIN) ophthalmic ointment Place 1 application into the left eye 3 (three) times daily. 3.5 g 1  . doxycycline (VIBRA-TABS) 100 MG tablet Take 1 tablet (100 mg total) by mouth 2 (two) times daily. 1 po bid (Patient not taking: Reported on 05/13/2019) 20 tablet 0  . hydrOXYzine (ATARAX/VISTARIL) 25 MG tablet Take 0.5-1 tablets (12.5-25 mg total) by mouth every 8 (eight) hours as needed for itching. 30 tablet 0  . pantoprazole (PROTONIX) 40 MG tablet Take by mouth.    . traZODone (DESYREL) 50 MG tablet Take by mouth.     No current facility-administered medications on file prior to visit.    Objective:     Vitals:   01/23/20 1014  BP: 118/79  Pulse: 65              Physical examination General NAD, Conversant  HEENT Atraumatic; Op clear with mmm.  Normo-cephalic. Pupils reactive. Anicteric sclerae  Thyroid/Neck Smooth without nodularity or enlargement. Normal ROM.  Neck Supple.  Skin No rashes, lesions or ulceration. Normal palpated skin turgor. No  nodularity.  Breasts: No masses or discharge.  Symmetric.  No axillary adenopathy.  Lungs: Clear to auscultation.No rales or wheezes. Normal Respiratory effort, no retractions.  Heart: NSR.  No murmurs or rubs appreciated. No periferal edema  Abdomen: Soft.  Non-tender.  No masses.  No HSM. No hernia  Extremities: Moves all appropriately.  Normal ROM for age. No lymphadenopathy.  Neuro: Oriented to PPT.  Normal mood. Normal affect.     Pelvic:   Vulva: Normal appearance.  No lesions.  Minimal perineal body  Vagina: No lesions or abnormalities noted.  Atrophic  Support:  Second-degree cystocele  Urethra No masses tenderness or scarring.  Meatus Normal size without lesions or prolapse.  Cervix: Normal appearance.  No lesions.  Anus: Normal exam.  No lesions.  Perineum: Normal exam.  No lesions.        Bimanual   Uterus: Normal size.  Non-tender.  Mobile.  AV.  Adnexae: No masses.  Non-tender to palpation.  Cul-de-sac: Negative for abnormality.   Exam limited by patient body habitus   Assessment:    [id number] Patient Active Problem List   Diagnosis Date Noted  . Endometrial hyperplasia without atypia, simple 07/21/2015  . Diverticulosis 07/21/2015  . Neuritis or radiculitis due to rupture of lumbar intervertebral disc 11/12/2014  . Lumbar canal stenosis 11/12/2014  .  Bursitis, trochanteric 11/12/2014  . Back ache 04/10/2014  . Chronic anemia 04/10/2014  . BP (high blood pressure) 04/10/2014  . Blood glucose elevated 04/10/2014  . HLD (hyperlipidemia) 04/10/2014  . Adult hypothyroidism 04/10/2014  . Adiposity 04/10/2014  . Obstructive apnea 04/10/2014     1. Encounter for screening mammogram for malignant neoplasm of breast   2. Well woman exam with routine gynecological exam   3. Morbid obesity with BMI of 45.0-49.9, adult ([place])        Plan:            1.  Basic Screening Recommendations The basic screening recommendations for asymptomatic women were discussed with  the patient during her visit.  The age-appropriate recommendations were discussed with her and the rational for the tests reviewed.  When I am informed by the patient that another primary care physician has previously obtained the age-appropriate tests and they are up-to-date, only outstanding tests are ordered and referrals given as necessary.  Abnormal results of tests will be discussed with her when all of her results are completed.  Routine preventative health maintenance measures emphasized: Exercise/Diet/Weight control, Tobacco Warnings, Alcohol/Substance use risks and Stress Management Pap performed-mammogram ordered 2.  Recommend discontinuation of medroxyprogesterone acetate Orders Orders Placed This Encounter  Procedures  . MM 3D SCREEN BREAST BILATERAL    No orders of the defined types were placed in this encounter.       F/U  Return in about 1 year (around 01/22/2021) for Annual Physical.  Finis Bud, M.D. 01/23/2020 10:51 AM

## 2020-01-23 NOTE — Addendum Note (Signed)
Addended by: Durwin Glaze on: 01/23/2020 01:54 PM   Modules accepted: Orders

## 2020-01-28 LAB — CYTOLOGY - PAP
Adequacy: ABSENT
Comment: NEGATIVE
Diagnosis: NEGATIVE
High risk HPV: NEGATIVE

## 2020-03-02 DIAGNOSIS — I10 Essential (primary) hypertension: Secondary | ICD-10-CM | POA: Diagnosis not present

## 2020-03-02 DIAGNOSIS — E782 Mixed hyperlipidemia: Secondary | ICD-10-CM | POA: Diagnosis not present

## 2020-03-02 DIAGNOSIS — R739 Hyperglycemia, unspecified: Secondary | ICD-10-CM | POA: Diagnosis not present

## 2020-03-02 DIAGNOSIS — Z79899 Other long term (current) drug therapy: Secondary | ICD-10-CM | POA: Diagnosis not present

## 2020-03-06 DIAGNOSIS — Z6841 Body Mass Index (BMI) 40.0 and over, adult: Secondary | ICD-10-CM | POA: Diagnosis not present

## 2020-03-06 DIAGNOSIS — E782 Mixed hyperlipidemia: Secondary | ICD-10-CM | POA: Diagnosis not present

## 2020-03-06 DIAGNOSIS — E039 Hypothyroidism, unspecified: Secondary | ICD-10-CM | POA: Diagnosis not present

## 2020-03-06 DIAGNOSIS — I10 Essential (primary) hypertension: Secondary | ICD-10-CM | POA: Diagnosis not present

## 2020-03-25 DIAGNOSIS — M546 Pain in thoracic spine: Secondary | ICD-10-CM | POA: Diagnosis not present

## 2020-03-25 DIAGNOSIS — M5134 Other intervertebral disc degeneration, thoracic region: Secondary | ICD-10-CM | POA: Diagnosis not present

## 2020-03-25 DIAGNOSIS — L989 Disorder of the skin and subcutaneous tissue, unspecified: Secondary | ICD-10-CM | POA: Diagnosis not present

## 2020-03-27 DIAGNOSIS — M25512 Pain in left shoulder: Secondary | ICD-10-CM | POA: Diagnosis not present

## 2020-03-27 DIAGNOSIS — M7522 Bicipital tendinitis, left shoulder: Secondary | ICD-10-CM | POA: Diagnosis not present

## 2020-03-27 DIAGNOSIS — M7542 Impingement syndrome of left shoulder: Secondary | ICD-10-CM | POA: Diagnosis not present

## 2020-03-27 DIAGNOSIS — M19012 Primary osteoarthritis, left shoulder: Secondary | ICD-10-CM | POA: Diagnosis not present

## 2020-03-31 DIAGNOSIS — M7522 Bicipital tendinitis, left shoulder: Secondary | ICD-10-CM | POA: Diagnosis not present

## 2020-03-31 DIAGNOSIS — M25512 Pain in left shoulder: Secondary | ICD-10-CM | POA: Diagnosis not present

## 2020-03-31 DIAGNOSIS — M19012 Primary osteoarthritis, left shoulder: Secondary | ICD-10-CM | POA: Diagnosis not present

## 2020-03-31 DIAGNOSIS — M7542 Impingement syndrome of left shoulder: Secondary | ICD-10-CM | POA: Diagnosis not present

## 2020-05-14 DIAGNOSIS — M7741 Metatarsalgia, right foot: Secondary | ICD-10-CM | POA: Diagnosis not present

## 2020-05-14 DIAGNOSIS — M48062 Spinal stenosis, lumbar region with neurogenic claudication: Secondary | ICD-10-CM | POA: Diagnosis not present

## 2020-05-22 ENCOUNTER — Other Ambulatory Visit: Payer: Self-pay | Admitting: Internal Medicine

## 2020-06-02 ENCOUNTER — Other Ambulatory Visit: Payer: Self-pay

## 2020-06-02 ENCOUNTER — Ambulatory Visit: Payer: 59 | Attending: Sports Medicine

## 2020-06-02 DIAGNOSIS — M25612 Stiffness of left shoulder, not elsewhere classified: Secondary | ICD-10-CM | POA: Insufficient documentation

## 2020-06-02 DIAGNOSIS — M25512 Pain in left shoulder: Secondary | ICD-10-CM | POA: Diagnosis not present

## 2020-06-02 NOTE — Therapy (Signed)
[place] MAIN [facility] SERVICES [address] [place], [place], [address] Phone: [phone]   Fax:  [phone]  Physical Therapy Evaluation  Patient Details  Name: Rebekah Perkins MRN: [id number] Date of Birth: 04/15/1956 Referring Provider (PT): Diamond Nickel   Encounter Date: 06/02/2020   PT End of Session - 06/03/20 1459    Visit Number 1    Number of Visits 16    Date for PT Re-Evaluation 07/28/20    Authorization Type 1/10 eval 7/20    PT Start Time 1101    PT Stop Time 1158    PT Time Calculation (min) 57 min    Activity Tolerance Patient tolerated treatment well;Patient limited by pain    Behavior During Therapy [facility] for tasks assessed/performed           Past Medical History:  Diagnosis Date  . Anxiety   . Apnea   . Diverticulitis   . Environmental allergies   . Hypertension   . Menopausal state   . Obesity   . PMB (postmenopausal bleeding)   . PVD (peripheral vascular disease) ([place])   . Rosacea   . Simple endometrial hyperplasia   . Thyroid disease   . Urinary urgency     Past Surgical History:  Procedure Laterality Date  . CHOLECYSTECTOMY    . DILATION AND CURETTAGE OF UTERUS    . HYSTEROSCOPY      There were no vitals filed for this visit.    Subjective Assessment - 06/03/20 1455    Subjective Patient is a pleasant 64 year old female who presents for L shoulder pain    Pertinent History Patient is a pleasant 64 year old female who presents for L shoulder pain. Pain began in January 2021 and was a slow/gradual onset. X rays performed on 12/03/19 showed mild degenerative change to the [facility] joint with some enthesopathic change at the greater tuberosity. Pain begins in her left anterolateral shoulder radiating down her biceps to midforearm. It worsens with reaching activities and has occasional bouts of weakness.  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 back limits sitting more than arm,    How long can you stand comfortably? back limits more than shoulder    How long can you walk comfortably? n/a    Diagnostic tests X rays performed on 12/03/19 showed mild degenerative change to the [facility] joint with some enthesopathic change at the greater tuberosity    Patient Stated Goals reduce pain    Currently in Pain? Yes    Pain Score 6     Pain Location Shoulder    Pain Orientation Left    Pain Descriptors / Indicators Aching;Penetrating    Pain Type Chronic pain    Pain Radiating Towards mid forearm    Pain Onset More than a month ago    Pain Frequency Intermittent    Aggravating Factors  reaching, lifting    Pain Relieving Factors rest    Effect of Pain on Daily Activities limits mobility at work and home              [facility] PT Assessment - 06/03/20 0001      Assessment   Medical Diagnosis  Acute pain of L  shoulder subacromial impingement of L shoulder, tendinitis of L rotator cuff, biceps tendinitis of LUE    Referring Provider (PT) Diamond Nickel    Onset Date/Surgical Date --   January 2021   Hand Dominance Left    Prior Therapy for hips not for shoulder      Precautions   Precautions None      Restrictions   Weight Bearing Restrictions No      Balance Screen   Has the patient fallen in the past 6 months No    Has the patient had a decrease in activity level because of a fear of falling?  Yes    Is the patient reluctant to leave their home because of a fear of falling?  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 reaching down and pulling up.     Leisure play with granddaughter              Acute pain of L shoulder subacromial impingement of L shoulder, tendinitis of L rotator cuff, biceps tendinitis of LUE     PAIN: At worst: 9/10 pain L shoulder At best: 2/10 Current: 6/10   Back pain also present: patient did not give number specifying pain   POSTURE: Seated: forward head rounded shoulders, L scapula protracted, elbow flexed and protectively set towards body  Standing/walking: L arm swing reduced, arm kept close to body:   PROM/AROM:  AROM BUE  Right Left  Shoulder Flexion 164 87   Shoulder Abduction 140 125*  ER T5 T2  IR L3  L1  Extension 10 degrees L     STRENGTH:  Graded on a 0-5 scale Muscle Group Left Right  Shoulder flex 2+/5* /5  Shoulder Abd 2+/5* /5  Shoulder Ext 2+/5 /5  Shoulder IR/ER 2+/5* /5  Elbow 3+/5* /5  Wrist/hand 4-/5 /5  ER/IR  Strength at the side 4/5 , when not at side unable to tolerate resistance * pain   SENSATION:  SOMATOSENSORY:  Any N & T in extremities or weakness: reports :         Sensation           Intact      Diminished         Absent  Light touch UE's                                SPECIAL TESTS: Michel Bickers +L, -R Neers + L, -R Apprehension : +L, -R Biceps Load Test +L, -R Further tests deferred due to pain limiting testing position    OUTCOME MEASURES: TEST Outcome Interpretation  FOTO 52.19% Predicted d/c score of 64%   QuickDash  34.1%   QuickDash Work 43.75%                   Treat: Access Code: [email or web address] URL: [email or web address] Date: 06/03/2020 Prepared by: Janna Arch  Exercises Circular Shoulder Pendulum with Table Support - 1 x daily - 7 x weekly - 15 reps - 3 sets Seated Upper Trapezius  Stretch - 1 x daily - 7 x weekly - 15 reps - 3  sets Seated Scapular Retraction - 1 x daily - 7 x weekly - 10 reps - 3 sets Seated Shoulder Flexion Towel Slide at Table Top - 1 x daily - 7 x weekly - 2 sets - 10 reps - 5 hold Seated Shoulder Abduction Towel Slide at Table Top - 1 x daily - 7 x weekly - 2 sets - 10 reps - 5 hold      Objective measurements completed on examination: See above findings.               PT Education - 06/03/20 1459    Education Details goals, POC, HEP    Person(s) Educated Patient    Methods Explanation;Demonstration;Tactile cues;Verbal cues;Handout    Comprehension Verbalized understanding;Returned demonstration;Verbal cues required;Tactile cues required            PT Short Term Goals - 06/03/20 1503      PT SHORT TERM GOAL #1   Title Patient will be independent in home exercise program to improve strength/mobility for better functional independence with ADLs.    Baseline 7/21: HEP given    Time 4    Period Weeks    Status New    Target Date 07/01/20             PT Long Term Goals - 06/03/20 1503      PT LONG TERM GOAL #1   Title Patient will increase FOTO score to equal to or greater than  64%   to demonstrate statistically significant improvement in mobility and quality of life.    Baseline 7/20: 52.19%    Time 8    Period Weeks    Status New    Target Date 07/28/20      PT LONG TERM GOAL #2   Title Patient will report a worst pain of 3/10 on VAS in L shoulder to improve tolerance with ADLs and reduced symptoms with activities.    Baseline 7/20: 9/10    Time 8    Period Weeks    Status New    Target Date 07/28/20      PT LONG TERM GOAL #3   Title Patient will decrease Quick DASH score by > 8 points ( 26.1%) demonstrating reduced self-reported upper extremity disability.    Baseline 7/20: 34.1%    Time 8    Period Weeks    Status New    Target Date 07/28/20      PT LONG TERM GOAL #4   Title Patient will improve shoulder AROM to > 120 degrees of flexion,  scaption, and abduction for improved ability to perform overhead activities.    Baseline 7/20: see note    Time 8    Period Weeks    Status New    Target Date 07/28/20      PT LONG TERM GOAL #5   Title Patient will increase strength of UE's to 4/5 with minimal pain to improve ability to perform work and home tasks and return to PLOF.    Baseline 7/20: see note    Time 8    Period Weeks    Status New    Target Date 07/28/20                  Plan - 06/03/20 1500    Clinical Impression Statement Patient is a pleasant 64 year old woman who presents to physical therapy for  acute  pain of L shoulder subacromial impingement of L shoulder, tendinitis of L rotator cuff, and biceps tendinitis of LUE. Her ROM of LUE is limited by pain and is strength is limited additionally by limited position.  Patient's full testing limited by pain at evaluation. She would benefit from skilled physical therapy twice a week however due to her financial situation and co-pay may only be able to receive therapy once a week. Patient will benefit from skilled physical therapy to increase strength, ROM, and reduce pain for return to PLOF and improve quality of life.    Personal Factors and Comorbidities Age;Comorbidity 3+;Finances;Fitness;Past/Current Experience;Profession;Time since onset of injury/illness/exacerbation    Comorbidities anxiety, HTN, obesity, PVD, thyroid disease, diverticulitis    Examination-Activity Limitations Bed Mobility;Caring for Others;Carry;Dressing;Reach Overhead;Squat;Hygiene/Grooming;Stand;Transfers;Other    Examination-Participation Restrictions Church;Cleaning;Community Activity;Driving;Meal Prep;Laundry;Shop;Yard Work;Other    Stability/Clinical Decision Making Evolving/Moderate complexity    Clinical Decision Making Moderate    Rehab Potential Fair    PT Frequency 2x / week    PT Duration 8 weeks    PT Treatment/Interventions ADLs/Self Care Home Management;Aquatic  Therapy;Biofeedback;Cryotherapy;Electrical Stimulation;Iontophoresis 4mg /ml Dexamethasone;Moist Heat;Traction;Ultrasound;Contrast Bath;Therapeutic activities;Therapeutic exercise;Manual techniques;Patient/family education;Manual lymph drainage;Compression bandaging;Passive range of motion;Dry needling;Vasopneumatic Device;Taping;Splinting;Energy conservation;Visual/perceptual remediation/compensation;Joint Manipulations    PT Next Visit Plan pain reduction, posture correction, stabilization, distraction    PT Home Exercise Plan see above    Consulted and Agree with Plan of Care Patient           Patient will benefit from skilled therapeutic intervention in order to improve the following deficits and impairments:  Decreased activity tolerance, Decreased endurance, Decreased mobility, Decreased range of motion, Decreased strength, Hypomobility, Increased edema, Impaired flexibility, Impaired perceived functional ability, Increased muscle spasms, Impaired UE functional use, Obesity, Postural dysfunction, Improper body mechanics, Pain  Visit Diagnosis: Acute pain of left shoulder  Stiffness of left shoulder, not elsewhere classified     Problem List Patient Active Problem List   Diagnosis Date Noted  . Endometrial hyperplasia without atypia, simple 07/21/2015  . Diverticulosis 07/21/2015  . Neuritis or radiculitis due to rupture of lumbar intervertebral disc 11/12/2014  . Lumbar canal stenosis 11/12/2014  . Bursitis, trochanteric 11/12/2014  . Back ache 04/10/2014  . Chronic anemia 04/10/2014  . BP (high blood pressure) 04/10/2014  . Blood glucose elevated 04/10/2014  . HLD (hyperlipidemia) 04/10/2014  . Adult hypothyroidism 04/10/2014  . Adiposity 04/10/2014  . Obstructive apnea 04/10/2014   Janna Arch, PT, DPT   06/03/2020, 3:11 PM  [place] MAIN [facility] SERVICES [address] [place], [place], [address] Phone: [phone]   Fax:   [phone]  Name: CAMERON KATAYAMA MRN: [id number] Date of Birth: 1956-07-04

## 2020-06-03 NOTE — Patient Instructions (Signed)
Access Code: [email or web address] URL: [email or web address] Date: 06/03/2020 Prepared by: Janna Arch  Exercises Circular Shoulder Pendulum with Table Support - 1 x daily - 7 x weekly - 15 reps - 3 sets Seated Upper Trapezius Stretch - 1 x daily - 7 x weekly - 15 reps - 3 sets Seated Scapular Retraction - 1 x daily - 7 x weekly - 10 reps - 3 sets Seated Shoulder Flexion Towel Slide at Table Top - 1 x daily - 7 x weekly - 2 sets - 10 reps - 5 hold Seated Shoulder Abduction Towel Slide at Table Top - 1 x daily - 7 x weekly - 2 sets - 10 reps - 5 hold

## 2020-06-04 ENCOUNTER — Ambulatory Visit: Payer: 59

## 2020-06-04 ENCOUNTER — Other Ambulatory Visit: Payer: Self-pay

## 2020-06-04 DIAGNOSIS — M25612 Stiffness of left shoulder, not elsewhere classified: Secondary | ICD-10-CM | POA: Diagnosis not present

## 2020-06-04 DIAGNOSIS — M25512 Pain in left shoulder: Secondary | ICD-10-CM

## 2020-06-04 NOTE — Therapy (Signed)
[place] MAIN [facility] SERVICES [address] [place], [place], [address] Phone: [phone]   Fax:  [phone]  Physical Therapy Treatment  Patient Details  Name: Rebekah Perkins MRN: [id number] Date of Birth: 10-27-1956 Referring Provider (PT): Diamond Nickel   Encounter Date: 06/04/2020   PT End of Session - 06/05/20 0831    Visit Number 2    Number of Visits 16    Date for PT Re-Evaluation 07/28/20    Authorization Type 2/10 eval 7/20    PT Start Time 1015    PT Stop Time 1059    PT Time Calculation (min) 44 min    Activity Tolerance Patient tolerated treatment well;Patient limited by pain    Behavior During Therapy [facility] for tasks assessed/performed           Past Medical History:  Diagnosis Date  . Anxiety   . Apnea   . Diverticulitis   . Environmental allergies   . Hypertension   . Menopausal state   . Obesity   . PMB (postmenopausal bleeding)   . PVD (peripheral vascular disease) ([place])   . Rosacea   . Simple endometrial hyperplasia   . Thyroid disease   . Urinary urgency     Past Surgical History:  Procedure Laterality Date  . CHOLECYSTECTOMY    . DILATION AND CURETTAGE OF UTERUS    . HYSTEROSCOPY      There were no vitals filed for this visit.   Subjective Assessment - 06/05/20 9179    Subjective Patient presents for first PT treatment session, has to go to work after session, has been compliant with new HEP    Pertinent History Patient is a pleasant 64 year old female who presents for L shoulder pain. Pain began in January 2021 and was a slow/gradual onset. X rays performed on 12/03/19 showed mild degenerative change to the [facility] joint with some enthesopathic change at the greater tuberosity. Pain begins in her left anterolateral shoulder radiating down her biceps to midforearm. It worsens with reaching activities and has occasional bouts of weakness.  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 back limits sitting more than arm,    How long can you stand comfortably? back limits more than shoulder    How long can you walk comfortably? n/a    Diagnostic tests X rays performed on 12/03/19 showed mild degenerative change to the [facility] joint with some enthesopathic change at the greater tuberosity    Patient Stated Goals reduce pain    Currently in Pain? Yes    Pain Score 5     Pain Location Shoulder    Pain Orientation Left    Pain Descriptors / Indicators Aching    Pain Type Chronic pain    Pain Onset More than a month ago    Pain Frequency Intermittent              Treatment:  Manual: Supine position: - gentle overpressure PROM with increasing range with repetition 8x 10 second holds  -L arm in scaption position: ER  -L arm in scaption position: IR  -flexion 5x 15 second holds  -abduction in slight scaption position  STM to lateral and anterior aspect of glenohumeral joint  as well as insertion of pectoral region and surrounding tissues: x 6 minutes, painful dime size region in lateral joint.    bicep trigger point with movement: elbow ext/flexion, with supination//pronation. X 8 minutes  Upper trap lengthening with cervical side bend and overpressure to [facility] joint and occiput 3x20 second holds  Cervical rotation 3x20 seconds overpressure to [place] joint and occiput    Joint mobilizations: Grade II-III AP 10x 5 second pulses; PA 8x 5 second pulses, inferior 10x 5 second pulses    Scapular rotation with depression and retraction with overpressure in seated position x 3 minutes  Distraction of L with hand placement above elbow for optimal mechanics 3x 20 seconds   TherEx isometrics into PT hand:  cues for 20-30% muscle activation for pain reduction, 10x 3 second holds  -flexion, abduction, adduction, extension, IR, ER   Pt educated throughout session about proper posture and technique with exercises. Improved exercise technique, movement at target joints, use of target muscles after min to mod verbal, visual, tactile cues    Patient presents with muscle guarding and pain that is reduced by end of session with patient reporting improved flexibility. Focal tender points noted throughout region with patient being heavily guarded initially. Manual and isometrics were tolerated well and will be an area for continued focus. Patient will benefit from skilled physical therapy to increase strength, ROM, and reduce pain for return to PLOF and improve quality of life.                  PT Education - 06/05/20 [phone]    Education Details exercise technique, manual    Person(s) Educated Patient    Methods Explanation;Demonstration;Tactile cues;Verbal cues    Comprehension Verbalized understanding;Returned demonstration;Verbal cues required;Tactile cues required            PT Short Term Goals - 06/03/20 1503      PT SHORT TERM GOAL #1   Title Patient will be independent in home exercise program to improve strength/mobility for better functional independence with ADLs.    Baseline 7/21: HEP given    Time 4    Period Weeks    Status New    Target Date 07/01/20             PT Long Term Goals - 06/03/20 1503      PT LONG TERM GOAL #1   Title Patient will increase FOTO score to equal to or greater than  64%   to demonstrate statistically significant improvement in mobility and quality of life.    Baseline 7/20: 52.19%    Time 8    Period Weeks    Status New    Target Date 07/28/20      PT LONG TERM GOAL #2   Title Patient will report a worst pain of 3/10 on VAS in L shoulder to improve tolerance with ADLs and reduced symptoms with activities.    Baseline 7/20: 9/10     Time 8    Period Weeks    Status New    Target Date 07/28/20      PT LONG TERM GOAL #3   Title Patient will decrease Quick DASH score by > 8 points ( 26.1%) demonstrating reduced self-reported upper extremity disability.    Baseline 7/20: 34.1%    Time 8    Period Weeks    Status New    Target Date 07/28/20      PT LONG TERM GOAL #4   Title Patient will  improve shoulder AROM to > 120 degrees of flexion, scaption, and abduction for improved ability to perform overhead activities.    Baseline 7/20: see note    Time 8    Period Weeks    Status New    Target Date 07/28/20      PT LONG TERM GOAL #5   Title Patient will increase strength of UE's to 4/5 with minimal pain to improve ability to perform work and home tasks and return to PLOF.    Baseline 7/20: see note    Time 8    Period Weeks    Status New    Target Date 07/28/20                 Plan - 06/05/20 2725    Clinical Impression Statement Patient presents with muscle guarding and pain that is reduced by end of session with patient reporting improved flexibility. Focal tender points noted throughout region with patient being heavily guarded initially. Manual and isometrics were tolerated well and will be an area for continued focus. Patient will benefit from skilled physical therapy to increase strength, ROM, and reduce pain for return to PLOF and improve quality of life.    Personal Factors and Comorbidities Age;Comorbidity 3+;Finances;Fitness;Past/Current Experience;Profession;Time since onset of injury/illness/exacerbation    Comorbidities anxiety, HTN, obesity, PVD, thyroid disease, diverticulitis    Examination-Activity Limitations Bed Mobility;Caring for Others;Carry;Dressing;Reach Overhead;Squat;Hygiene/Grooming;Stand;Transfers;Other    Examination-Participation Restrictions Church;Cleaning;Community Activity;Driving;Meal Prep;Laundry;Shop;Yard Work;Other    Stability/Clinical Decision Making Evolving/Moderate  complexity    Rehab Potential Fair    PT Frequency 2x / week    PT Duration 8 weeks    PT Treatment/Interventions ADLs/Self Care Home Management;Aquatic Therapy;Biofeedback;Cryotherapy;Electrical Stimulation;Iontophoresis 4mg /ml Dexamethasone;Moist Heat;Traction;Ultrasound;Contrast Bath;Therapeutic activities;Therapeutic exercise;Manual techniques;Patient/family education;Manual lymph drainage;Compression bandaging;Passive range of motion;Dry needling;Vasopneumatic Device;Taping;Splinting;Energy conservation;Visual/perceptual remediation/compensation;Joint Manipulations    PT Next Visit Plan pain reduction, posture correction, stabilization, distraction    PT Home Exercise Plan see above    Consulted and Agree with Plan of Care Patient           Patient will benefit from skilled therapeutic intervention in order to improve the following deficits and impairments:  Decreased activity tolerance, Decreased endurance, Decreased mobility, Decreased range of motion, Decreased strength, Hypomobility, Increased edema, Impaired flexibility, Impaired perceived functional ability, Increased muscle spasms, Impaired UE functional use, Obesity, Postural dysfunction, Improper body mechanics, Pain  Visit Diagnosis: Acute pain of left shoulder  Stiffness of left shoulder, not elsewhere classified     Problem List Patient Active Problem List   Diagnosis Date Noted  . Endometrial hyperplasia without atypia, simple 07/21/2015  . Diverticulosis 07/21/2015  . Neuritis or radiculitis due to rupture of lumbar intervertebral disc 11/12/2014  . Lumbar canal stenosis 11/12/2014  . Bursitis, trochanteric 11/12/2014  . Back ache 04/10/2014  . Chronic anemia 04/10/2014  . BP (high blood pressure) 04/10/2014  . Blood glucose elevated 04/10/2014  . HLD (hyperlipidemia) 04/10/2014  . Adult hypothyroidism 04/10/2014  . Adiposity 04/10/2014  . Obstructive apnea 04/10/2014   Janna Arch, PT, DPT   06/05/2020,  8:32 AM  [place] MAIN [facility] SERVICES [address] [place], [place], [address] Phone: [phone]   Fax:  [phone]  Name: Rebekah Perkins MRN: [id number] Date of Birth: [birth year]

## 2020-06-09 ENCOUNTER — Ambulatory Visit: Payer: 59

## 2020-06-09 DIAGNOSIS — M62830 Muscle spasm of back: Secondary | ICD-10-CM | POA: Diagnosis not present

## 2020-06-09 DIAGNOSIS — M5136 Other intervertebral disc degeneration, lumbar region: Secondary | ICD-10-CM | POA: Diagnosis not present

## 2020-06-09 DIAGNOSIS — G5700 Lesion of sciatic nerve, unspecified lower limb: Secondary | ICD-10-CM | POA: Diagnosis not present

## 2020-06-09 DIAGNOSIS — M5416 Radiculopathy, lumbar region: Secondary | ICD-10-CM | POA: Diagnosis not present

## 2020-06-09 DIAGNOSIS — M48062 Spinal stenosis, lumbar region with neurogenic claudication: Secondary | ICD-10-CM | POA: Diagnosis not present

## 2020-06-11 ENCOUNTER — Ambulatory Visit: Payer: 59

## 2020-06-11 ENCOUNTER — Other Ambulatory Visit: Payer: Self-pay

## 2020-06-11 DIAGNOSIS — M25612 Stiffness of left shoulder, not elsewhere classified: Secondary | ICD-10-CM | POA: Diagnosis not present

## 2020-06-11 DIAGNOSIS — M25512 Pain in left shoulder: Secondary | ICD-10-CM

## 2020-06-11 NOTE — Therapy (Signed)
[place] MAIN [facility] SERVICES [address] [place], [place], [address] Phone: [phone]   Fax:  [phone]  Physical Therapy Treatment  Patient Details  Name: Rebekah Perkins MRN: [id number] Date of Birth: 08-Jul-1956 Referring Provider (PT): Diamond Nickel   Encounter Date: 06/11/2020   PT End of Session - 06/11/20 1140    Visit Number 3    Number of Visits 16    Date for PT Re-Evaluation 07/28/20    Authorization Type 3/10 eval 7/20    PT Start Time 0930    PT Stop Time 1013    PT Time Calculation (min) 43 min    Activity Tolerance Patient tolerated treatment well;Patient limited by pain    Behavior During Therapy [facility] for tasks assessed/performed           Past Medical History:  Diagnosis Date   Anxiety    Apnea    Diverticulitis    Environmental allergies    Hypertension    Menopausal state    Obesity    PMB (postmenopausal bleeding)    PVD (peripheral vascular disease) ([place])    Rosacea    Simple endometrial hyperplasia    Thyroid disease    Urinary urgency     Past Surgical History:  Procedure Laterality Date   CHOLECYSTECTOMY     DILATION AND CURETTAGE OF UTERUS     HYSTEROSCOPY      There were no vitals filed for this visit.   Subjective Assessment - 06/11/20 1138    Subjective Patient reports her shoulder is improving, not as painful. Had a hard day at work yesterday and is feeling it today.    Pertinent History Patient is a pleasant 64 year old female who presents for L shoulder pain. Pain began in January 2021 and was a slow/gradual onset. X rays performed on 12/03/19 showed mild degenerative change to the [facility] joint with some enthesopathic change at the greater tuberosity. Pain begins in her left anterolateral shoulder radiating down her biceps to midforearm. It worsens with reaching activities and has occasional bouts of weakness.  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 back limits sitting more than arm,    How long can you stand comfortably? back limits more than shoulder    How long can you walk comfortably? n/a    Diagnostic tests X rays performed on 12/03/19 showed mild degenerative change to the [facility] joint with some enthesopathic change at the greater tuberosity    Patient Stated Goals reduce pain    Currently in Pain? Yes    Pain Score 3     Pain Location Shoulder    Pain Orientation Left    Pain Descriptors / Indicators Aching    Pain Type Chronic pain    Pain Onset More than a month ago    Pain Frequency Intermittent               Manual: Supine position: - gentle overpressure PROM with increasing range with repetition 8x 10 second holds             -L arm in scaption position: ER             -L arm in scaption position: IR             -  flexion 5x 15 second holds             -abduction in slight scaption position  STM to lateral and anterior aspect of glenohumeral joint as well as insertion of pectoral region and surrounding tissues: x 6 minutes, painful dime size region in lateral joint.    STM to upper trap, levator trap, and paraspinals x 3 minutes with multiple trigger points noted.    bicep trigger point with movement: elbow ext/flexion, with supination//pronation. X 6 minutes    Joint mobilizations: Grade II-III AP 10x 5 second pulses; PA 8x 5 second pulses, inferior 10x 5 second pulses    Scapular rotation with depression and retraction with overpressure in seated position x 3 minutes   Distraction of L with hand placement above elbow for optimal mechanics 4x 20 seconds    Prone: -Grade II-III CPA, UPA 3x 10 seconds each level mobilizations to  thoracic spine and R subscap -J mobilization to upper thoracic for reduction of kyphotic posture and decreased impingement posture 3x 20 seconds  Access Code: [email or web address] URL: [email or web address] Date: 06/11/2020 Prepared by: Janna Arch  Exercises Supine Shoulder External Rotation with Dowel - 1 x daily - 7 x weekly - 2 sets - 10 reps - 5 hold Supine Shoulder Flexion AAROM with Dowel - 1 x daily - 7 x weekly - 2 sets - 10 reps - 5 hold       Pt educated throughout session about proper posture and technique with exercises. Improved exercise technique, movement at target joints, use of target muscles after min to mod verbal, visual, tactile cues                        PT Education - 06/11/20 1139    Education Details exercise technique, AAROM, manual    Person(s) Educated Patient    Methods Explanation;Demonstration;Tactile cues;Verbal cues    Comprehension Verbalized understanding;Returned demonstration;Verbal cues required;Tactile cues required            PT Short Term Goals - 06/03/20 1503      PT SHORT TERM GOAL #1   Title Patient will be independent in home exercise program to improve strength/mobility for better functional independence with ADLs.    Baseline 7/21: HEP given    Time 4    Period Weeks    Status New    Target Date 07/01/20             PT Long Term Goals - 06/03/20 1503      PT LONG TERM GOAL #1   Title Patient will increase FOTO score to equal to or greater than  64%   to demonstrate statistically significant improvement in mobility and quality of life.    Baseline 7/20: 52.19%    Time 8    Period Weeks    Status New    Target Date 07/28/20      PT LONG TERM GOAL #2   Title Patient will report a worst pain of 3/10 on VAS in L shoulder to improve tolerance with ADLs and reduced symptoms with activities.    Baseline 7/20: 9/10    Time 8    Period Weeks    Status New    Target Date 07/28/20      PT LONG  TERM GOAL #3   Title Patient will decrease Quick DASH score by > 8 points ( 26.1%) demonstrating reduced self-reported upper extremity disability.    Baseline 7/20: 34.1%  Time 8    Period Weeks    Status New    Target Date 07/28/20      PT LONG TERM GOAL #4   Title Patient will improve shoulder AROM to > 120 degrees of flexion, scaption, and abduction for improved ability to perform overhead activities.    Baseline 7/20: see note    Time 8    Period Weeks    Status New    Target Date 07/28/20      PT LONG TERM GOAL #5   Title Patient will increase strength of UE's to 4/5 with minimal pain to improve ability to perform work and home tasks and return to PLOF.    Baseline 7/20: see note    Time 8    Period Weeks    Status New    Target Date 07/28/20                 Plan - 06/11/20 1147    Clinical Impression Statement Patient presents with improved pain levels and increasing ROM of affected UE. Scapular and thoracic mobilizations are tender but improve functional movement and postural correction. Patient has less trigger points this session however continues to have multiple. Patient will benefit from skilled physical therapy to increase strength, ROM, and reduce pain for return to PLOF and improve quality of life.    Personal Factors and Comorbidities Age;Comorbidity 3+;Finances;Fitness;Past/Current Experience;Profession;Time since onset of injury/illness/exacerbation    Comorbidities anxiety, HTN, obesity, PVD, thyroid disease, diverticulitis    Examination-Activity Limitations Bed Mobility;Caring for Others;Carry;Dressing;Reach Overhead;Squat;Hygiene/Grooming;Stand;Transfers;Other    Examination-Participation Restrictions Church;Cleaning;Community Activity;Driving;Meal Prep;Laundry;Shop;Yard Work;Other    Stability/Clinical Decision Making Evolving/Moderate complexity    Rehab Potential Fair    PT Frequency 2x / week    PT Duration 8 weeks    PT Treatment/Interventions  ADLs/Self Care Home Management;Aquatic Therapy;Biofeedback;Cryotherapy;Electrical Stimulation;Iontophoresis 4mg /ml Dexamethasone;Moist Heat;Traction;Ultrasound;Contrast Bath;Therapeutic activities;Therapeutic exercise;Manual techniques;Patient/family education;Manual lymph drainage;Compression bandaging;Passive range of motion;Dry needling;Vasopneumatic Device;Taping;Splinting;Energy conservation;Visual/perceptual remediation/compensation;Joint Manipulations    PT Next Visit Plan pain reduction, posture correction, stabilization, distraction    PT Home Exercise Plan see above    Consulted and Agree with Plan of Care Patient           Patient will benefit from skilled therapeutic intervention in order to improve the following deficits and impairments:  Decreased activity tolerance, Decreased endurance, Decreased mobility, Decreased range of motion, Decreased strength, Hypomobility, Increased edema, Impaired flexibility, Impaired perceived functional ability, Increased muscle spasms, Impaired UE functional use, Obesity, Postural dysfunction, Improper body mechanics, Pain  Visit Diagnosis: Acute pain of left shoulder  Stiffness of left shoulder, not elsewhere classified     Problem List Patient Active Problem List   Diagnosis Date Noted   Endometrial hyperplasia without atypia, simple 07/21/2015   Diverticulosis 07/21/2015   Neuritis or radiculitis due to rupture of lumbar intervertebral disc 11/12/2014   Lumbar canal stenosis 11/12/2014   Bursitis, trochanteric 11/12/2014   Back ache 04/10/2014   Chronic anemia 04/10/2014   BP (high blood pressure) 04/10/2014   Blood glucose elevated 04/10/2014   HLD (hyperlipidemia) 04/10/2014   Adult hypothyroidism 04/10/2014   Adiposity 04/10/2014   Obstructive apnea 04/10/2014   Janna Arch, PT, DPT   06/11/2020, 11:50 AM  [place] [address] [place], [place],  [address] Phone: [phone]   Fax:  [phone]  Name: Rebekah Perkins MRN: [id number] Date of Birth: 19-Oct-1956

## 2020-06-22 ENCOUNTER — Other Ambulatory Visit: Payer: Self-pay

## 2020-06-22 ENCOUNTER — Ambulatory Visit: Payer: 59 | Attending: Sports Medicine

## 2020-06-22 DIAGNOSIS — M6281 Muscle weakness (generalized): Secondary | ICD-10-CM | POA: Diagnosis not present

## 2020-06-22 DIAGNOSIS — R293 Abnormal posture: Secondary | ICD-10-CM | POA: Diagnosis not present

## 2020-06-22 DIAGNOSIS — M25612 Stiffness of left shoulder, not elsewhere classified: Secondary | ICD-10-CM | POA: Diagnosis not present

## 2020-06-22 DIAGNOSIS — M25512 Pain in left shoulder: Secondary | ICD-10-CM | POA: Diagnosis not present

## 2020-06-22 DIAGNOSIS — M545 Low back pain: Secondary | ICD-10-CM | POA: Diagnosis not present

## 2020-06-22 NOTE — Therapy (Signed)
[place] MAIN [facility] SERVICES [address] [place], [place], [address] Phone: [phone]   Fax:  [phone]  Physical Therapy Treatment  Patient Details  Name: Rebekah Perkins MRN: [id number] Date of Birth: [birth year] Referring Provider (PT): Diamond Nickel   Encounter Date: 06/22/2020   PT End of Session - 06/22/20 1234    Visit Number 4    Number of Visits 16    Date for PT Re-Evaluation 07/28/20    Authorization Type 4/10 eval 7/20    PT Start Time 0845    PT Stop Time 0924    PT Time Calculation (min) 39 min    Activity Tolerance Patient tolerated treatment well;Patient limited by pain    Behavior During Therapy [facility] for tasks assessed/performed           Past Medical History:  Diagnosis Date  . Anxiety   . Apnea   . Diverticulitis   . Environmental allergies   . Hypertension   . Menopausal state   . Obesity   . PMB (postmenopausal bleeding)   . PVD (peripheral vascular disease) ([place])   . Rosacea   . Simple endometrial hyperplasia   . Thyroid disease   . Urinary urgency     Past Surgical History:  Procedure Laterality Date  . CHOLECYSTECTOMY    . DILATION AND CURETTAGE OF UTERUS    . HYSTEROSCOPY      There were no vitals filed for this visit.   Subjective Assessment - 06/22/20 0931    Subjective Patient reports she has been compliant with HEP. Hasn't had sharp pain only dull aches when working.    Pertinent History Patient is a pleasant 63 year old female who presents for L shoulder pain. Pain began in January 2021 and was a slow/gradual onset. X rays performed on 12/03/19 showed mild degenerative change to the [facility] joint with some enthesopathic change at the greater tuberosity. Pain begins in her left anterolateral shoulder radiating down her biceps to midforearm. It worsens with reaching activities and has occasional bouts of weakness.  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 back limits sitting more than arm,    How long can you stand comfortably? back limits more than shoulder    How long can you walk comfortably? n/a    Diagnostic tests X rays performed on 12/03/19 showed mild degenerative change to the [facility] joint with some enthesopathic change at the greater tuberosity    Patient Stated Goals reduce pain    Currently in Pain? Yes    Pain Score 1     Pain Location Shoulder    Pain Orientation Left    Pain Descriptors / Indicators Aching    Pain Type Chronic pain    Pain Onset More than a month ago              Manual: Supine position: - gentle overpressure PROM with increasing range with repetition 8x 10 second holds             -L arm in scaption position: ER             -L arm in scaption position: IR             -  flexion 5x 15 second holds             -abduction in slight scaption position  STM to lateral and anterior aspect of glenohumeral joint as well as insertion of pectoral region and surrounding tissues: x 6 minutes, painful dime size region in lateral joint.     STM to upper trap, levator trap, and paraspinals x 3 minutes with multiple trigger points noted.    bicep trigger point with movement: elbow ext/flexion, with supination//pronation. X 6 minutes     Joint mobilizations: Grade II-III AP 10x 5 second pulses; PA 8x 5 second pulses, inferior 10x 5 second pulses    Scapular rotation with depression and retraction with overpressure in seated position x 3 minutes   Distraction of L with hand placement above elbow for optimal mechanics 4x 20 seconds    Prone: -Grade II-III CPA, UPA 3x 10 seconds each level mobilizations to thoracic spine and R subscap -J mobilization to upper  thoracic for reduction of kyphotic posture and decreased impingement posture 3x 20 seconds  TherEx: -scapular retraction 10x with focus on scapular retraction and depression.           Pt educated throughout session about proper posture and technique with exercises. Improved exercise technique, movement at target joints, use of target muscles after min to mod verbal, visual, tactile cues                        PT Education - 06/22/20 1233    Education Details exercise technique, body mechanics, manual    Person(s) Educated Patient    Methods Explanation;Demonstration;Tactile cues;Verbal cues    Comprehension Verbalized understanding;Returned demonstration;Verbal cues required;Tactile cues required            PT Short Term Goals - 06/03/20 1503      PT SHORT TERM GOAL #1   Title Patient will be independent in home exercise program to improve strength/mobility for better functional independence with ADLs.    Baseline 7/21: HEP given    Time 4    Period Weeks    Status New    Target Date 07/01/20             PT Long Term Goals - 06/03/20 1503      PT LONG TERM GOAL #1   Title Patient will increase FOTO score to equal to or greater than  64%   to demonstrate statistically significant improvement in mobility and quality of life.    Baseline 7/20: 52.19%    Time 8    Period Weeks    Status New    Target Date 07/28/20      PT LONG TERM GOAL #2   Title Patient will report a worst pain of 3/10 on VAS in L shoulder to improve tolerance with ADLs and reduced symptoms with activities.    Baseline 7/20: 9/10    Time 8    Period Weeks    Status New    Target Date 07/28/20      PT LONG TERM GOAL #3   Title Patient will decrease Quick DASH score by > 8 points ( 26.1%) demonstrating reduced self-reported upper extremity disability.    Baseline 7/20: 34.1%    Time 8    Period Weeks    Status New    Target Date 07/28/20      PT LONG TERM GOAL #4   Title  Patient will improve shoulder AROM to > 120 degrees  of flexion, scaption, and abduction for improved ability to perform overhead activities.    Baseline 7/20: see note    Time 8    Period Weeks    Status New    Target Date 07/28/20      PT LONG TERM GOAL #5   Title Patient will increase strength of UE's to 4/5 with minimal pain to improve ability to perform work and home tasks and return to PLOF.    Baseline 7/20: see note    Time 8    Period Weeks    Status New    Target Date 07/28/20                 Plan - 06/22/20 1238    Clinical Impression Statement Patient's functional range of motion is improving with decreased pain and less restriction. Patient no longer is experiencing sharp pain however does still have soreness after work. Education on postural correction when reaching for decreased discomfort performed with patient demonstrating understanding.  Patient will benefit from skilled physical therapy to increase strength, ROM, and reduce pain for return to PLOF and improve quality of life.    Personal Factors and Comorbidities Age;Comorbidity 3+;Finances;Fitness;Past/Current Experience;Profession;Time since onset of injury/illness/exacerbation    Comorbidities anxiety, HTN, obesity, PVD, thyroid disease, diverticulitis    Examination-Activity Limitations Bed Mobility;Caring for Others;Carry;Dressing;Reach Overhead;Squat;Hygiene/Grooming;Stand;Transfers;Other    Examination-Participation Restrictions Church;Cleaning;Community Activity;Driving;Meal Prep;Laundry;Shop;Yard Work;Other    Stability/Clinical Decision Making Evolving/Moderate complexity    Rehab Potential Fair    PT Frequency 2x / week    PT Duration 8 weeks    PT Treatment/Interventions ADLs/Self Care Home Management;Aquatic Therapy;Biofeedback;Cryotherapy;Electrical Stimulation;Iontophoresis 4mg /ml Dexamethasone;Moist Heat;Traction;Ultrasound;Contrast Bath;Therapeutic activities;Therapeutic exercise;Manual  techniques;Patient/family education;Manual lymph drainage;Compression bandaging;Passive range of motion;Dry needling;Vasopneumatic Device;Taping;Splinting;Energy conservation;Visual/perceptual remediation/compensation;Joint Manipulations    PT Next Visit Plan pain reduction, posture correction, stabilization, distraction    PT Home Exercise Plan see above    Consulted and Agree with Plan of Care Patient           Patient will benefit from skilled therapeutic intervention in order to improve the following deficits and impairments:  Decreased activity tolerance, Decreased endurance, Decreased mobility, Decreased range of motion, Decreased strength, Hypomobility, Increased edema, Impaired flexibility, Impaired perceived functional ability, Increased muscle spasms, Impaired UE functional use, Obesity, Postural dysfunction, Improper body mechanics, Pain  Visit Diagnosis: Acute pain of left shoulder  Stiffness of left shoulder, not elsewhere classified     Problem List Patient Active Problem List   Diagnosis Date Noted  . Endometrial hyperplasia without atypia, simple 07/21/2015  . Diverticulosis 07/21/2015  . Neuritis or radiculitis due to rupture of lumbar intervertebral disc 11/12/2014  . Lumbar canal stenosis 11/12/2014  . Bursitis, trochanteric 11/12/2014  . Back ache 04/10/2014  . Chronic anemia 04/10/2014  . BP (high blood pressure) 04/10/2014  . Blood glucose elevated 04/10/2014  . HLD (hyperlipidemia) 04/10/2014  . Adult hypothyroidism 04/10/2014  . Adiposity 04/10/2014  . Obstructive apnea 04/10/2014   Janna Arch, PT, DPT   06/22/2020, 12:39 PM  [place] MAIN [facility] SERVICES [address] [place], [place], [address] Phone: [phone]   Fax:  [phone]  Name: Rebekah Perkins MRN: [id number] Date of Birth: August 09, 1956

## 2020-06-24 ENCOUNTER — Other Ambulatory Visit: Payer: Self-pay

## 2020-06-24 ENCOUNTER — Ambulatory Visit: Payer: 59

## 2020-06-24 DIAGNOSIS — M545 Low back pain: Secondary | ICD-10-CM | POA: Diagnosis not present

## 2020-06-24 DIAGNOSIS — M25512 Pain in left shoulder: Secondary | ICD-10-CM | POA: Diagnosis not present

## 2020-06-24 DIAGNOSIS — R293 Abnormal posture: Secondary | ICD-10-CM | POA: Diagnosis not present

## 2020-06-24 DIAGNOSIS — M6281 Muscle weakness (generalized): Secondary | ICD-10-CM | POA: Diagnosis not present

## 2020-06-24 DIAGNOSIS — M25612 Stiffness of left shoulder, not elsewhere classified: Secondary | ICD-10-CM | POA: Diagnosis not present

## 2020-06-24 NOTE — Therapy (Signed)
[place] MAIN [facility] SERVICES [address] [place], [place], [address] Phone: [phone]   Fax:  [phone]  Physical Therapy Treatment  Patient Details  Name: Rebekah Perkins MRN: [id number] Date of Birth: Mar 11, 1956 Referring Provider (PT): Diamond Nickel   Encounter Date: 06/24/2020   PT End of Session - 06/24/20 0937    Visit Number 5    Number of Visits 16    Date for PT Re-Evaluation 07/28/20    Authorization Type 5/10 eval 7/20    PT Start Time 0846    PT Stop Time 0926    PT Time Calculation (min) 40 min    Activity Tolerance Patient tolerated treatment well;Patient limited by pain    Behavior During Therapy [facility] for tasks assessed/performed           Past Medical History:  Diagnosis Date   Anxiety    Apnea    Diverticulitis    Environmental allergies    Hypertension    Menopausal state    Obesity    PMB (postmenopausal bleeding)    PVD (peripheral vascular disease) ([place])    Rosacea    Simple endometrial hyperplasia    Thyroid disease    Urinary urgency     Past Surgical History:  Procedure Laterality Date   CHOLECYSTECTOMY     DILATION AND CURETTAGE OF UTERUS     HYSTEROSCOPY      There were no vitals filed for this visit.   Subjective Assessment - 06/24/20 0935    Subjective Patient reports feeling sore but no pain after last session. Has been having no episodes of sharp pain and is almost to baseline.    Pertinent History Patient is a pleasant 64 year old female who presents for L shoulder pain. Pain began in January 2021 and was a slow/gradual onset. X rays performed on 12/03/19 showed mild degenerative change to the [facility] joint with some enthesopathic change at the greater tuberosity. Pain begins in her left anterolateral shoulder radiating down her biceps to midforearm. It worsens with reaching activities and has occasional bouts of weakness.  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 back limits sitting more than arm,    How long can you stand comfortably? back limits more than shoulder    How long can you walk comfortably? n/a    Diagnostic tests X rays performed on 12/03/19 showed mild degenerative change to the [facility] joint with some enthesopathic change at the greater tuberosity    Patient Stated Goals reduce pain    Currently in Pain? No/denies                   Manual: Supine position: - gentle overpressure PROM with increasing range with repetition 8x 10 second holds             -L arm in scaption position: ER             -L arm in scaption position: IR             -flexion 5x 15 second holds             -abduction in slight scaption position  STM to lateral and anterior aspect of glenohumeral joint  as well as insertion of pectoral region and surrounding tissues: x 6 minutes, painful dime size region in lateral joint.     STM to upper trap, levator trap, and paraspinals x 3 minutes with multiple trigger points noted.    bicep trigger point with movement: elbow ext/flexion, with supination//pronation. X 6 minutes     Joint mobilizations: Grade II-III AP 10x 5 second pulses; PA 8x 5 second pulses, inferior 10x 5 second pulses    Scapular rotation with depression and retraction with overpressure in seated position x 3 minutes   Distraction of L with hand placement above elbow for optimal mechanics 4x 20 seconds    Prone: -Grade II-III CPA, UPA 3x 10 seconds each level mobilizations to thoracic spine and R subscap -J mobilization to upper thoracic for reduction of kyphotic posture and decreased impingement posture 3x 20 seconds   TherEx: -scapular retraction  10x with focus on scapular retraction and depression.   -seated RTB ER 8x tactile cueing for alignment        Pt educated throughout session about proper posture and technique with exercises. Improved exercise technique, movement at target joints, use of target muscles after min to mod verbal, visual, tactile cues                     PT Education - 06/24/20 0937    Education Details exercise technique, body mechanics, manual    Person(s) Educated Patient    Methods Explanation;Demonstration;Tactile cues;Verbal cues    Comprehension Verbalized understanding;Returned demonstration;Verbal cues required;Tactile cues required            PT Short Term Goals - 06/03/20 1503      PT SHORT TERM GOAL #1   Title Patient will be independent in home exercise program to improve strength/mobility for better functional independence with ADLs.    Baseline 7/21: HEP given    Time 4    Period Weeks    Status New    Target Date 07/01/20             PT Long Term Goals - 06/03/20 1503      PT LONG TERM GOAL #1   Title Patient will increase FOTO score to equal to or greater than  64%   to demonstrate statistically significant improvement in mobility and quality of life.    Baseline 7/20: 52.19%    Time 8    Period Weeks    Status New    Target Date 07/28/20      PT LONG TERM GOAL #2   Title Patient will report a worst pain of 3/10 on VAS in L shoulder to improve tolerance with ADLs and reduced symptoms with activities.    Baseline 7/20: 9/10    Time 8    Period Weeks    Status New    Target Date 07/28/20      PT LONG TERM GOAL #3   Title Patient will decrease Quick DASH score by > 8 points ( 26.1%) demonstrating reduced self-reported upper extremity disability.    Baseline 7/20: 34.1%    Time 8    Period Weeks    Status New    Target Date 07/28/20      PT LONG TERM GOAL #4   Title Patient will improve shoulder AROM to > 120 degrees of flexion, scaption, and  abduction for improved ability to perform overhead activities.    Baseline 7/20: see note    Time 8  Period Weeks    Status New    Target Date 07/28/20      PT LONG TERM GOAL #5   Title Patient will increase strength of UE's to 4/5 with minimal pain to improve ability to perform work and home tasks and return to PLOF.    Baseline 7/20: see note    Time 8    Period Weeks    Status New    Target Date 07/28/20                 Plan - 06/24/20 2774    Clinical Impression Statement Patient is progressing with increasing range of motion with no episodes of sharp pain. Trigger points of bicep and surrounding scapular region reduced with manual. Discussion of nearing end of plan of care agreed upon with only a few more sessions required with patient agreeing to discuss with physician about potential of new referral for back as it is primary pain source at this time. Patient will benefit from skilled physical therapy to increase strength, ROM, and reduce pain for return to PLOF and improve quality of life.    Personal Factors and Comorbidities Age;Comorbidity 3+;Finances;Fitness;Past/Current Experience;Profession;Time since onset of injury/illness/exacerbation    Comorbidities anxiety, HTN, obesity, PVD, thyroid disease, diverticulitis    Examination-Activity Limitations Bed Mobility;Caring for Others;Carry;Dressing;Reach Overhead;Squat;Hygiene/Grooming;Stand;Transfers;Other    Examination-Participation Restrictions Church;Cleaning;Community Activity;Driving;Meal Prep;Laundry;Shop;Yard Work;Other    Stability/Clinical Decision Making Evolving/Moderate complexity    Rehab Potential Fair    PT Frequency 2x / week    PT Duration 8 weeks    PT Treatment/Interventions ADLs/Self Care Home Management;Aquatic Therapy;Biofeedback;Cryotherapy;Electrical Stimulation;Iontophoresis 4mg /ml Dexamethasone;Moist Heat;Traction;Ultrasound;Contrast Bath;Therapeutic activities;Therapeutic exercise;Manual  techniques;Patient/family education;Manual lymph drainage;Compression bandaging;Passive range of motion;Dry needling;Vasopneumatic Device;Taping;Splinting;Energy conservation;Visual/perceptual remediation/compensation;Joint Manipulations    PT Next Visit Plan pain reduction, posture correction, stabilization, distraction    PT Home Exercise Plan see above    Consulted and Agree with Plan of Care Patient           Patient will benefit from skilled therapeutic intervention in order to improve the following deficits and impairments:  Decreased activity tolerance, Decreased endurance, Decreased mobility, Decreased range of motion, Decreased strength, Hypomobility, Increased edema, Impaired flexibility, Impaired perceived functional ability, Increased muscle spasms, Impaired UE functional use, Obesity, Postural dysfunction, Improper body mechanics, Pain  Visit Diagnosis: Acute pain of left shoulder  Stiffness of left shoulder, not elsewhere classified     Problem List Patient Active Problem List   Diagnosis Date Noted   Endometrial hyperplasia without atypia, simple 07/21/2015   Diverticulosis 07/21/2015   Neuritis or radiculitis due to rupture of lumbar intervertebral disc 11/12/2014   Lumbar canal stenosis 11/12/2014   Bursitis, trochanteric 11/12/2014   Back ache 04/10/2014   Chronic anemia 04/10/2014   BP (high blood pressure) 04/10/2014   Blood glucose elevated 04/10/2014   HLD (hyperlipidemia) 04/10/2014   Adult hypothyroidism 04/10/2014   Adiposity 04/10/2014   Obstructive apnea 04/10/2014   Janna Arch, PT, DPT   06/24/2020, 9:40 AM  [place] [address] [place], [place], [address] Phone: [phone]   Fax:  [phone]  Name: Rebekah Perkins MRN: [id number] Date of Birth: 09-07-1956

## 2020-06-29 ENCOUNTER — Ambulatory Visit: Payer: 59

## 2020-06-29 ENCOUNTER — Other Ambulatory Visit: Payer: Self-pay

## 2020-06-29 DIAGNOSIS — M25612 Stiffness of left shoulder, not elsewhere classified: Secondary | ICD-10-CM | POA: Diagnosis not present

## 2020-06-29 DIAGNOSIS — R293 Abnormal posture: Secondary | ICD-10-CM | POA: Diagnosis not present

## 2020-06-29 DIAGNOSIS — M25512 Pain in left shoulder: Secondary | ICD-10-CM

## 2020-06-29 DIAGNOSIS — M545 Low back pain: Secondary | ICD-10-CM | POA: Diagnosis not present

## 2020-06-29 DIAGNOSIS — M6281 Muscle weakness (generalized): Secondary | ICD-10-CM | POA: Diagnosis not present

## 2020-06-29 NOTE — Therapy (Signed)
[place] MAIN [facility] SERVICES [address] [place], [place], [address] Phone: [phone]   Fax:  [phone]  Physical Therapy Treatment  Patient Details  Name: Rebekah Perkins MRN: [id number] Date of Birth: 12/31/1955 Referring Provider (PT): Diamond Nickel   Encounter Date: 06/29/2020   PT End of Session - 06/29/20 0948    Visit Number 6    Number of Visits 16    Date for PT Re-Evaluation 07/28/20    Authorization Type 6/10 eval 7/20    PT Start Time 0846    PT Stop Time 0928    PT Time Calculation (min) 42 min    Activity Tolerance Patient tolerated treatment well;Patient limited by pain    Behavior During Therapy [facility] for tasks assessed/performed           Past Medical History:  Diagnosis Date  . Anxiety   . Apnea   . Diverticulitis   . Environmental allergies   . Hypertension   . Menopausal state   . Obesity   . PMB (postmenopausal bleeding)   . PVD (peripheral vascular disease) ([place])   . Rosacea   . Simple endometrial hyperplasia   . Thyroid disease   . Urinary urgency     Past Surgical History:  Procedure Laterality Date  . CHOLECYSTECTOMY    . DILATION AND CURETTAGE OF UTERUS    . HYSTEROSCOPY      There were no vitals filed for this visit.   Subjective Assessment - 06/29/20 0946    Subjective Patient reports having some discomfort only when lifting a box at work. Feels she is 80% back to baseline but not there yet.    Pertinent History Patient is a pleasant 64 year old female who presents for L shoulder pain. Pain began in January 2021 and was a slow/gradual onset. X rays performed on 12/03/19 showed mild degenerative change to the [facility] joint with some enthesopathic change at the greater tuberosity. Pain begins in her left anterolateral shoulder radiating down her biceps to midforearm. It worsens with reaching activities and has occasional bouts of weakness.  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 back limits sitting more than arm,    How long can you stand comfortably? back limits more than shoulder    How long can you walk comfortably? n/a    Diagnostic tests X rays performed on 12/03/19 showed mild degenerative change to the [facility] joint with some enthesopathic change at the greater tuberosity    Patient Stated Goals reduce pain    Currently in Pain? Yes    Pain Score 1     Pain Location Shoulder    Pain Orientation Left    Pain Descriptors / Indicators Aching    Pain Type Chronic pain              Manual: Supine position: - gentle overpressure PROM with increasing range with repetition 8x 10 second holds             -L arm in scaption position: ER             -L arm in scaption position: IR             -flexion 5x 15  second holds             -abduction in slight scaption position  STM to lateral and anterior aspect of glenohumeral joint as well as insertion of pectoral region and surrounding tissues: x 6 minutes, painful dime size region in lateral joint.     STM to upper trap, levator trap, and paraspinals x 3 minutes with multiple trigger points noted.    bicep trigger point with movement: elbow ext/flexion, with supination//pronation. X 6 minutes     Joint mobilizations: Grade II-III AP 10x 5 second pulses; PA 8x 5 second pulses, inferior 10x 5 second pulses    Scapular rotation with depression and retraction with overpressure in seated position x 3 minutes   Distraction of L with hand placement above elbow for optimal mechanics 4x 20 seconds    Prone: -Grade II-III CPA, UPA 3x 10 seconds each level mobilizations to thoracic spine and R subscap -J mobilization to upper thoracic for  reduction of kyphotic posture and decreased impingement posture 3x 20 seconds   TherEx: -scapular retraction 10x with focus on scapular retraction and depression.  -Supine ER with hold for pectoral lengthening x 4x 10 second holds -scapular retraction with stabilization against pertubation's 30 seconds.         Pt educated throughout session about proper posture and technique with exercises. Improved exercise technique, movement at target joints, use of target muscles after min to mod verbal, visual, tactile cues                         PT Education - 06/29/20 0947    Education Details exercise technique, body mechanics, manual    Person(s) Educated Patient    Methods Explanation;Demonstration;Tactile cues;Verbal cues    Comprehension Verbalized understanding;Returned demonstration;Verbal cues required;Tactile cues required            PT Short Term Goals - 06/03/20 1503      PT SHORT TERM GOAL #1   Title Patient will be independent in home exercise program to improve strength/mobility for better functional independence with ADLs.    Baseline 7/21: HEP given    Time 4    Period Weeks    Status New    Target Date 07/01/20             PT Long Term Goals - 06/03/20 1503      PT LONG TERM GOAL #1   Title Patient will increase FOTO score to equal to or greater than  64%   to demonstrate statistically significant improvement in mobility and quality of life.    Baseline 7/20: 52.19%    Time 8    Period Weeks    Status New    Target Date 07/28/20      PT LONG TERM GOAL #2   Title Patient will report a worst pain of 3/10 on VAS in L shoulder to improve tolerance with ADLs and reduced symptoms with activities.    Baseline 7/20: 9/10    Time 8    Period Weeks    Status New    Target Date 07/28/20      PT LONG TERM GOAL #3   Title Patient will decrease Quick DASH score by > 8 points ( 26.1%) demonstrating reduced self-reported upper extremity disability.     Baseline 7/20: 34.1%    Time 8    Period Weeks    Status New    Target Date 07/28/20  PT LONG TERM GOAL #4   Title Patient will improve shoulder AROM to > 120 degrees of flexion, scaption, and abduction for improved ability to perform overhead activities.    Baseline 7/20: see note    Time 8    Period Weeks    Status New    Target Date 07/28/20      PT LONG TERM GOAL #5   Title Patient will increase strength of UE's to 4/5 with minimal pain to improve ability to perform work and home tasks and return to PLOF.    Baseline 7/20: see note    Time 8    Period Weeks    Status New    Target Date 07/28/20                 Plan - 06/29/20 0949    Clinical Impression Statement Patient is progressing with functional range with decreased pain. Trigger points of bicep and anterior deltoid reduced with manual. Postural correction education performed with patient demonstrating understanding. Patient will benefit from skilled physical therapy to increase strength, ROM, and reduce pain for return to PLOF and improve quality of life    Personal Factors and Comorbidities Age;Comorbidity 3+;Finances;Fitness;Past/Current Experience;Profession;Time since onset of injury/illness/exacerbation    Comorbidities anxiety, HTN, obesity, PVD, thyroid disease, diverticulitis    Examination-Activity Limitations Bed Mobility;Caring for Others;Carry;Dressing;Reach Overhead;Squat;Hygiene/Grooming;Stand;Transfers;Other    Examination-Participation Restrictions Church;Cleaning;Community Activity;Driving;Meal Prep;Laundry;Shop;Yard Work;Other    Stability/Clinical Decision Making Evolving/Moderate complexity    Rehab Potential Fair    PT Frequency 2x / week    PT Duration 8 weeks    PT Treatment/Interventions ADLs/Self Care Home Management;Aquatic Therapy;Biofeedback;Cryotherapy;Electrical Stimulation;Iontophoresis 4mg /ml Dexamethasone;Moist Heat;Traction;Ultrasound;Contrast Bath;Therapeutic  activities;Therapeutic exercise;Manual techniques;Patient/family education;Manual lymph drainage;Compression bandaging;Passive range of motion;Dry needling;Vasopneumatic Device;Taping;Splinting;Energy conservation;Visual/perceptual remediation/compensation;Joint Manipulations    PT Next Visit Plan pain reduction, posture correction, stabilization, distraction    PT Home Exercise Plan see above    Consulted and Agree with Plan of Care Patient           Patient will benefit from skilled therapeutic intervention in order to improve the following deficits and impairments:  Decreased activity tolerance, Decreased endurance, Decreased mobility, Decreased range of motion, Decreased strength, Hypomobility, Increased edema, Impaired flexibility, Impaired perceived functional ability, Increased muscle spasms, Impaired UE functional use, Obesity, Postural dysfunction, Improper body mechanics, Pain  Visit Diagnosis: Acute pain of left shoulder  Stiffness of left shoulder, not elsewhere classified     Problem List Patient Active Problem List   Diagnosis Date Noted  . Endometrial hyperplasia without atypia, simple 07/21/2015  . Diverticulosis 07/21/2015  . Neuritis or radiculitis due to rupture of lumbar intervertebral disc 11/12/2014  . Lumbar canal stenosis 11/12/2014  . Bursitis, trochanteric 11/12/2014  . Back ache 04/10/2014  . Chronic anemia 04/10/2014  . BP (high blood pressure) 04/10/2014  . Blood glucose elevated 04/10/2014  . HLD (hyperlipidemia) 04/10/2014  . Adult hypothyroidism 04/10/2014  . Adiposity 04/10/2014  . Obstructive apnea 04/10/2014   Janna Arch, PT, DPT   06/29/2020, 9:50 AM  [place] MAIN [facility] SERVICES [address] [place], [place], [address] Phone: [phone]   Fax:  [phone]  Name: KARLENA LUEBKE MRN: [id number] Date of Birth: 07-19-1956

## 2020-07-06 ENCOUNTER — Ambulatory Visit: Payer: 59

## 2020-07-08 ENCOUNTER — Ambulatory Visit: Payer: 59

## 2020-07-08 ENCOUNTER — Other Ambulatory Visit: Payer: Self-pay

## 2020-07-08 DIAGNOSIS — R293 Abnormal posture: Secondary | ICD-10-CM | POA: Diagnosis not present

## 2020-07-08 DIAGNOSIS — M25512 Pain in left shoulder: Secondary | ICD-10-CM | POA: Diagnosis not present

## 2020-07-08 DIAGNOSIS — M25612 Stiffness of left shoulder, not elsewhere classified: Secondary | ICD-10-CM | POA: Diagnosis not present

## 2020-07-08 DIAGNOSIS — M545 Low back pain: Secondary | ICD-10-CM | POA: Diagnosis not present

## 2020-07-08 DIAGNOSIS — M6281 Muscle weakness (generalized): Secondary | ICD-10-CM | POA: Diagnosis not present

## 2020-07-08 NOTE — Therapy (Signed)
[place] MAIN [facility] SERVICES [address] [place], [place], [address] Phone: [phone]   Fax:  [phone]  Physical Therapy Treatment/Discharge  Patient Details  Name: Rebekah Perkins MRN: [id number] Date of Birth: 08-03-1956 Referring Provider (PT): Diamond Nickel   Encounter Date: 07/08/2020   PT End of Session - 07/08/20 0943    Visit Number 7    Number of Visits 16    Date for PT Re-Evaluation 07/28/20    Authorization Type 7/10 eval 7/20    PT Start Time 0845    PT Stop Time 0909    PT Time Calculation (min) 24 min    Activity Tolerance Patient tolerated treatment well;Patient limited by pain    Behavior During Therapy [facility] for tasks assessed/performed           Past Medical History:  Diagnosis Date   Anxiety    Apnea    Diverticulitis    Environmental allergies    Hypertension    Menopausal state    Obesity    PMB (postmenopausal bleeding)    PVD (peripheral vascular disease) ([place])    Rosacea    Simple endometrial hyperplasia    Thyroid disease    Urinary urgency     Past Surgical History:  Procedure Laterality Date   CHOLECYSTECTOMY     DILATION AND CURETTAGE OF UTERUS     HYSTEROSCOPY      There were no vitals filed for this visit.   Subjective Assessment - 07/08/20 0942    Subjective Patient reports her shoulder is no longer painful and her back is primary problem.    Pertinent History Patient is a pleasant 64 year old female who presents for L shoulder pain. Pain began in January 2021 and was a slow/gradual onset. X rays performed on 12/03/19 showed mild degenerative change to the [facility] joint with some enthesopathic change at the greater tuberosity. Pain begins in her left anterolateral shoulder radiating down her biceps to midforearm. It worsens with reaching activities and has occasional bouts of weakness.  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 back limits sitting more than arm,    How long can you stand comfortably? back limits more than shoulder    How long can you walk comfortably? n/a    Diagnostic tests X rays performed on 12/03/19 showed mild degenerative change to the [facility] joint with some enthesopathic change at the greater tuberosity    Patient Stated Goals reduce pain    Currently in Pain? No/denies                 FOTO: 78%  VAS: 0/10  QuickDash: 0%  BUE strength: 4+/5  BUE ROM: FLEXION 141 ABDUCTION 139   Patient's shoulder will be discharged due to return to functional level and plan to change POC to upcoming new order for back as this is primary disability at this time. She has met all goals for her shoulder and is now pain free. I will be happy to see this patient again in the future as needed.                   PT Education - 07/08/20 0943    Education Details discharge  Person(s) Educated Patient    Methods Explanation    Comprehension Verbalized understanding            PT Short Term Goals - 07/08/20 0855      PT SHORT TERM GOAL #1   Title Patient will be independent in home exercise program to improve strength/mobility for better functional independence with ADLs.    Baseline 7/21: HEP given /25: HEP compliant    Time 4    Period Weeks    Status Achieved    Target Date 07/01/20             PT Long Term Goals - 07/08/20 0856      PT LONG TERM GOAL #1   Title Patient will increase FOTO score to equal to or greater than  64%   to demonstrate statistically significant improvement in mobility and quality of life.    Baseline 7/20: 52.19% 8/25: 78%    Time 8    Period Weeks    Status Achieved      PT LONG TERM GOAL  #2   Title Patient will report a worst pain of 3/10 on VAS in L shoulder to improve tolerance with ADLs and reduced symptoms with activities.    Baseline 7/20: 9/10 8/25: 0/10    Time 8    Period Weeks    Status Achieved      PT LONG TERM GOAL #3   Title Patient will decrease Quick DASH score by > 8 points ( 26.1%) demonstrating reduced self-reported upper extremity disability.    Baseline 7/20: 34.1% 8/25: 0%    Time 8    Period Weeks    Status Achieved      PT LONG TERM GOAL #4   Title Patient will improve shoulder AROM to > 120 degrees of flexion, scaption, and abduction for improved ability to perform overhead activities.    Baseline 7/20: see note 8/25: flexion 141 abduction 139    Time 8    Period Weeks    Status Achieved      PT LONG TERM GOAL #5   Title Patient will increase strength of UE's to 4/5 with minimal pain to improve ability to perform work and home tasks and return to PLOF.    Baseline 7/20: see note 8/25: 4+/5    Time 8    Period Weeks    Status Achieved                 Plan - 07/08/20 0945    Clinical Impression Statement Patient's shoulder will be discharged due to return to functional level and plan to change POC to upcoming new order for back as this is primary disability at this time. She has met all goals for her shoulder and is now pain free. I will be happy to see this patient again in the future as needed.    Personal Factors and Comorbidities Age;Comorbidity 3+;Finances;Fitness;Past/Current Experience;Profession;Time since onset of injury/illness/exacerbation    Comorbidities anxiety, HTN, obesity, PVD, thyroid disease, diverticulitis    Examination-Activity Limitations Bed Mobility;Caring for Others;Carry;Dressing;Reach Overhead;Squat;Hygiene/Grooming;Stand;Transfers;Other    Examination-Participation Restrictions Church;Cleaning;Community Activity;Driving;Meal Prep;Laundry;Shop;Yard Work;Other    Stability/Clinical Decision Making  Evolving/Moderate complexity    Rehab Potential Fair    PT Frequency 2x / week    PT Duration 8 weeks    PT Treatment/Interventions ADLs/Self Care Home Management;Aquatic Therapy;Biofeedback;Cryotherapy;Electrical Stimulation;Iontophoresis 7m/ml Dexamethasone;Moist Heat;Traction;Ultrasound;Contrast Bath;Therapeutic activities;Therapeutic exercise;Manual techniques;Patient/family education;Manual lymph drainage;Compression bandaging;Passive range of motion;Dry needling;Vasopneumatic Device;Taping;Splinting;Energy conservation;Visual/perceptual remediation/compensation;Joint Manipulations  PT Next Visit Plan pain reduction, posture correction, stabilization, distraction    PT Home Exercise Plan see above    Consulted and Agree with Plan of Care Patient           Patient will benefit from skilled therapeutic intervention in order to improve the following deficits and impairments:  Decreased activity tolerance, Decreased endurance, Decreased mobility, Decreased range of motion, Decreased strength, Hypomobility, Increased edema, Impaired flexibility, Impaired perceived functional ability, Increased muscle spasms, Impaired UE functional use, Obesity, Postural dysfunction, Improper body mechanics, Pain  Visit Diagnosis: Acute pain of left shoulder  Stiffness of left shoulder, not elsewhere classified     Problem List Patient Active Problem List   Diagnosis Date Noted   Endometrial hyperplasia without atypia, simple 07/21/2015   Diverticulosis 07/21/2015   Neuritis or radiculitis due to rupture of lumbar intervertebral disc 11/12/2014   Lumbar canal stenosis 11/12/2014   Bursitis, trochanteric 11/12/2014   Back ache 04/10/2014   Chronic anemia 04/10/2014   BP (high blood pressure) 04/10/2014   Blood glucose elevated 04/10/2014   HLD (hyperlipidemia) 04/10/2014   Adult hypothyroidism 04/10/2014   Adiposity 04/10/2014   Obstructive apnea 04/10/2014   Janna Arch, PT,  DPT   07/08/2020, 9:45 AM  [place] [facility] MAIN [facility] SERVICES [address] [place], [place], [address] Phone: [phone]   Fax:  [phone]  Name: Rebekah Perkins MRN: [id number] Date of Birth: 07/13/1956

## 2020-07-09 ENCOUNTER — Other Ambulatory Visit: Payer: Self-pay | Admitting: Family Medicine

## 2020-07-09 DIAGNOSIS — G8929 Other chronic pain: Secondary | ICD-10-CM

## 2020-07-09 DIAGNOSIS — M5136 Other intervertebral disc degeneration, lumbar region: Secondary | ICD-10-CM

## 2020-07-13 DIAGNOSIS — R0602 Shortness of breath: Secondary | ICD-10-CM | POA: Diagnosis not present

## 2020-07-13 DIAGNOSIS — I10 Essential (primary) hypertension: Secondary | ICD-10-CM | POA: Diagnosis not present

## 2020-07-13 DIAGNOSIS — E782 Mixed hyperlipidemia: Secondary | ICD-10-CM | POA: Diagnosis not present

## 2020-07-13 DIAGNOSIS — I208 Other forms of angina pectoris: Secondary | ICD-10-CM | POA: Diagnosis not present

## 2020-07-14 ENCOUNTER — Ambulatory Visit: Payer: 59

## 2020-07-14 ENCOUNTER — Other Ambulatory Visit: Payer: Self-pay

## 2020-07-14 DIAGNOSIS — M545 Low back pain, unspecified: Secondary | ICD-10-CM

## 2020-07-14 DIAGNOSIS — R293 Abnormal posture: Secondary | ICD-10-CM | POA: Diagnosis not present

## 2020-07-14 DIAGNOSIS — M25512 Pain in left shoulder: Secondary | ICD-10-CM | POA: Diagnosis not present

## 2020-07-14 DIAGNOSIS — M25612 Stiffness of left shoulder, not elsewhere classified: Secondary | ICD-10-CM | POA: Diagnosis not present

## 2020-07-14 DIAGNOSIS — M6281 Muscle weakness (generalized): Secondary | ICD-10-CM

## 2020-07-14 NOTE — Patient Instructions (Signed)
Access Code: [email or web address] URL: [email or web address] Date: 07/14/2020 Prepared by: Janna Arch  Exercises Prone Press Up on Elbows - 1 x daily - 7 x weekly - 2 sets - 10 reps - 5 hold Supine Sciatic Nerve Mobilization With Leg on Pillow - 1 x daily - 7 x weekly - 2 sets - 10 reps - 5 hold Supine Posterior Pelvic Tilt - 1 x daily - 7 x weekly - 2 sets - 10 reps - 5 hold Hooklying Lumbar Rotation - 1 x daily - 7 x weekly - 2 sets - 10 reps - 5 hold

## 2020-07-14 NOTE — Therapy (Signed)
[place] MAIN [facility] SERVICES [address] [place], [place], [address] Phone: [phone]   Fax:  [phone]  Physical Therapy Evaluation  Patient Details  Name: Rebekah Perkins MRN: [id number] Date of Birth: Dec 17, 1955 Referring Provider (PT): Meeler, [place], Carlean Jews    Encounter Date: 07/14/2020   PT End of Session - 07/14/20 1747    Visit Number 1    Number of Visits 16    Date for PT Re-Evaluation 09/08/20    Authorization Type 1/10 eval 8/31    PT Start Time 1300    PT Stop Time 1345    PT Time Calculation (min) 45 min    Activity Tolerance Patient limited by pain    Behavior During Therapy [facility] for tasks assessed/performed           Past Medical History:  Diagnosis Date   Anxiety    Apnea    Diverticulitis    Environmental allergies    Hypertension    Menopausal state    Obesity    PMB (postmenopausal bleeding)    PVD (peripheral vascular disease) ([place])    Rosacea    Simple endometrial hyperplasia    Thyroid disease    Urinary urgency     Past Surgical History:  Procedure Laterality Date   CHOLECYSTECTOMY     DILATION AND CURETTAGE OF UTERUS     HYSTEROSCOPY      There were no vitals filed for this visit.    Subjective Assessment - 07/14/20 1306    Subjective Patient presents for low back pain evaluation.    Pertinent History Patient is a pleasant 64 year old female who presents for DDD, back muscle spasm, and lumbar radiculitis. Patient has seen this therapist in the past for her shoulder. Patient has history of back pain however beginning in June 2021 she has had extreme exacerbation that has limited her quality of life. Patient is getting an MRI on Sept 14th, 2021 as one has not been completed since 2015. Pain is described as radiating down left and right LE's with left side being primary source of radiating pain. Has not had any episodes of bowel and bladder but does feel weakness of legs. She also  describes pain at the lateral hips interferes with her ability to lie on her sides. Sitting feels like she is being stabbed. Leaning forward is additionally painful. Laying down relieves pain when flat on back. 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    Limitations Sitting;Lifting;Walking;House hold activities;Other (comment)    How long can you sit comfortably? 5 minutes severe pain: feels sitting on sharp rocks    How long can you stand comfortably? not limited by back pain    How long can you walk comfortably? loosens with prolonged walking    Diagnostic tests awaiting MRI onthe 14th    Patient Stated Goals to reduce pain, be able to sit again    Currently in Pain? Yes    Pain Score 2     Pain Location Back    Pain Orientation Lower    Pain Descriptors / Indicators Aching    Pain Type Chronic pain;Acute pain    Pain Radiating Towards L calf    Pain Onset More than a month ago    Pain Frequency Intermittent    Aggravating Factors  leaning forward, sitting    Pain Relieving Factors laying on back, heat    Effect of Pain on Daily Activities limits  activities at work and home                     [facility] PT Assessment - 07/14/20 0001      Assessment   Medical Diagnosis DDD, lumbar radiculitis, back muscle spasm     Referring Provider (PT) Meeler, Loree Fee, L     Onset Date/Surgical Date --   june 2021   Hand Dominance Left    Prior Therapy for shoulder      Precautions   Precautions None      Restrictions   Weight Bearing Restrictions No      Balance Screen   Has the patient fallen in the past 6 months No    Has the patient had a decrease in activity level because of a fear of falling?  Yes    Is the patient reluctant to leave their home because of a fear of falling?  No      Home Environment   Living Environment Private residence    Available Help at Discharge Family    Type of [place] to enter    Entrance  Stairs-Number of Steps 2      Prior Function   Level of [place] Full time employment    Vocation Requirements lifting overhead, reaching, pulling heavy cart. reaching down and pulling up.     Leisure play with granddaughter       Cognition   Overall Cognitive Status Within Functional Limits for tasks assessed      Observation/Other Assessments   Focus on Therapeutic Outcomes (FOTO)  51%    Other Surveys  Modified Oswestry    Modified Oswestry 40%           PAIN: Lumbar: Worst pain: 10/10; get home at end of night  Best pain: 0/10 Current pain: 2/10   Gets better when laying down flat on back or with heat   POSTURE: Limited lumbar curvature, excessive thoracic kyphosis with forward head rounded shoulders   PROM/AROM:  PROM BLE Hamstring length limited bilaterally: L 32 degrees R 26 degrees   AROM BLE: Hip extension limited by pain Hip flexion limited by pain    Gowers sign trunk flexion  Trunk Flexion Limited by >25% ,has to walk up her legs due to spasm to return to upright position *  Trunk Extension Limited by 10%, reduces pain with repetition   Trunk R SB limited by 25% *  Trunk L SB Limited by 25% *  Trunk R rotation Limited >45 % *  Trunk L rotation Limited >45 % *  * pain   Accessory Motions:  Lumbar:  CPA: L5, L4 pain reducing with repetition; rest of lumbar spine hypomobile but nonpainful UPA: pain increasing with R and L L5, L4 ; rest of lumbar spine hypomobile but non painful Sacrum: Distraction inferior glide: pain reducing  Lateral inferior glide to each side: painful L and R  STRENGTH:  Graded on a 0-5 scale Muscle Group Left Right  Hip Flex 3/5 3/5  Hip Abd 3+/5 3+/5  Hip Add 3/5 3/5  Hip Ext 3/5 3/5  Hip IR/ER 3/5 3/5  Knee Flex 3+/5 3+/5  Knee Ext 3+/5 3+/5  Ankle DF 4-/5 3/5  Ankle PF 4-/5 3/5   SENSATION:      SOMATOSENSORY:  Any N & T in extremities or weakness: reports :         Sensation  Intact      Diminished         Absent  Light touch LEs                               SPECIAL TESTS: + slump +SLR  +centralization/periopheralization with repeated extension  - FABER -FAIR -distraction test -compression test  Tender to palpation of greater trochanter  FUNCTIONAL MOBILITY: STS: requires heavy use of UE's due to pain    GAIT: Limited arm swing and LE movement, short small steps  OUTCOME MEASURES: TEST Outcome Interpretation  MODI 40%   FOTO 51% Predicted discharge score of 64%                        Access Code: [email or web address] URL: [email or web address] Date: 07/14/2020 Prepared by: Janna Arch  Exercises Prone Press Up on Elbows - 1 x daily - 7 x weekly - 2 sets - 10 reps - 5 hold Supine Sciatic Nerve Mobilization With Leg on Pillow - 1 x daily - 7 x weekly - 2 sets - 10 reps - 5 hold Supine Posterior Pelvic Tilt - 1 x daily - 7 x weekly - 2 sets - 10 reps - 5 hold Hooklying Lumbar Rotation - 1 x daily - 7 x weekly - 2 sets - 10 reps - 5 hold           Objective measurements completed on examination: See above findings.               PT Education - 07/14/20 1747    Education Details goals, POC , HEP    Person(s) Educated Patient    Methods Explanation;Demonstration;Tactile cues;Verbal cues;Handout    Comprehension Verbalized understanding;Returned demonstration;Verbal cues required;Tactile cues required            PT Short Term Goals - 07/14/20 1752      PT SHORT TERM GOAL #1   Title Patient will be independent in home exercise program to improve strength/mobility for better functional independence with ADLs.    Baseline 8/31: HEP given    Time 4    Period Weeks    Status New    Target Date 08/11/20             PT Long Term Goals - 07/14/20 1752      PT LONG TERM GOAL #1   Title Patient will increase FOTO score to equal to or greater than  64%   to demonstrate statistically significant  improvement in mobility and quality of life.    Baseline 8/31: 51%    Time 8    Period Weeks    Status New    Target Date 09/08/20      PT LONG TERM GOAL #2   Title Patient will report a worst pain of 3/10 on VAS in lumbar region to improve tolerance with ADLs and reduced symptoms with activities.    Baseline 8/31: 10/10    Time 8    Period Weeks    Status New    Target Date 09/08/20      PT LONG TERM GOAL #3   Title Patient will reduce modified Oswestry score to <20 as to demonstrate minimal disability with ADLs including improved sleeping tolerance, walking/sitting tolerance etc for better mobility with ADLs    Baseline 8/31: 40%    Time 8    Period Weeks    Status New  Target Date 09/08/20      PT LONG TERM GOAL #4   Title Patient will increase BLE gross strength to 4+/5 as to improve functional strength for independent gait, increased standing tolerance and increased ADL ability.    Baseline 8/31: see note    Time 8    Period Weeks    Status New    Target Date 09/08/20                  Plan - 07/14/20 1749    Clinical Impression Statement Patient is a very pleasant 64 year old female who presents for low back pain. Patient is able to centralize radiating pain with trunk extension, prone press ups, and CPAs. Excessive muscle guarding and limited control/strength of LE's results in higher pain levels and compensatory body mechanics. Patient additionally has irritation of bilateral greater trochanter regions that limits mobility. Focus on reduction of muscle guarding, re-alignment of lumbar spine, and strengthening of LE's with increasing flexibility of limbs with benefit patient. Patient will benefit from skilled physical therapy to reduce pain, improve function and mobility, and improve quality of life.    Personal Factors and Comorbidities Age;Comorbidity 3+;Finances;Fitness;Past/Current Experience;Profession;Time since onset of injury/illness/exacerbation     Comorbidities anxiety, HTN, obesity, PVD, thyroid disease, diverticulitis, HLD, bursitis trochanteric    Examination-Activity Limitations Caring for Others;Dressing;Squat;Stand;Transfers;Other;Bed Mobility;Locomotion Level;Lift;Stairs    Examination-Participation Restrictions Church;Cleaning;Community Activity;Driving;Meal Prep;Laundry;Shop;Yard Work;Other    Stability/Clinical Decision Making Evolving/Moderate complexity    Clinical Decision Making Moderate    Rehab Potential Fair    PT Frequency 2x / week    PT Duration 8 weeks    PT Treatment/Interventions ADLs/Self Care Home Management;Aquatic Therapy;Biofeedback;Cryotherapy;Electrical Stimulation;Iontophoresis 4mg /ml Dexamethasone;Moist Heat;Traction;Ultrasound;Contrast Bath;Therapeutic activities;Therapeutic exercise;Manual techniques;Patient/family education;Manual lymph drainage;Compression bandaging;Passive range of motion;Dry needling;Vasopneumatic Device;Taping;Splinting;Energy conservation;Visual/perceptual remediation/compensation;Joint Manipulations;Gait training;Stair training;Functional mobility training;Neuromuscular re-education;Balance training;Spinal Manipulations    PT Next Visit Plan STM low back, repeated trunk extension, LE lengthening    PT Home Exercise Plan see above    Consulted and Agree with Plan of Care Patient           Patient will benefit from skilled therapeutic intervention in order to improve the following deficits and impairments:  Decreased activity tolerance, Decreased endurance, Decreased mobility, Decreased range of motion, Decreased strength, Hypomobility, Increased edema, Impaired flexibility, Impaired perceived functional ability, Increased muscle spasms, Obesity, Postural dysfunction, Improper body mechanics, Pain, Abnormal gait, Decreased balance, Difficulty walking  Visit Diagnosis: Low back pain, unspecified back pain laterality, unspecified chronicity, unspecified whether sciatica  present  Abnormal posture  Muscle weakness (generalized)     Problem List Patient Active Problem List   Diagnosis Date Noted   Endometrial hyperplasia without atypia, simple 07/21/2015   Diverticulosis 07/21/2015   Neuritis or radiculitis due to rupture of lumbar intervertebral disc 11/12/2014   Lumbar canal stenosis 11/12/2014   Bursitis, trochanteric 11/12/2014   Back ache 04/10/2014   Chronic anemia 04/10/2014   BP (high blood pressure) 04/10/2014   Blood glucose elevated 04/10/2014   HLD (hyperlipidemia) 04/10/2014   Adult hypothyroidism 04/10/2014   Adiposity 04/10/2014   Obstructive apnea 04/10/2014   Janna Arch, PT, DPT   07/14/2020, 5:56 PM  [place] MAIN [facility] SERVICES [address] [place], [place], [address] Phone: [phone]   Fax:  [phone]  Name: Rebekah Perkins MRN: [id number] Date of Birth: 21-Jun-1956

## 2020-07-16 ENCOUNTER — Ambulatory Visit: Payer: 59 | Attending: Sports Medicine | Admitting: Physical Therapy

## 2020-07-16 ENCOUNTER — Encounter: Payer: Self-pay | Admitting: Physical Therapy

## 2020-07-16 ENCOUNTER — Other Ambulatory Visit: Payer: Self-pay

## 2020-07-16 DIAGNOSIS — R293 Abnormal posture: Secondary | ICD-10-CM | POA: Diagnosis not present

## 2020-07-16 DIAGNOSIS — M25512 Pain in left shoulder: Secondary | ICD-10-CM | POA: Diagnosis not present

## 2020-07-16 DIAGNOSIS — M545 Low back pain, unspecified: Secondary | ICD-10-CM

## 2020-07-16 DIAGNOSIS — M6281 Muscle weakness (generalized): Secondary | ICD-10-CM | POA: Diagnosis not present

## 2020-07-16 DIAGNOSIS — M25612 Stiffness of left shoulder, not elsewhere classified: Secondary | ICD-10-CM | POA: Diagnosis not present

## 2020-07-16 NOTE — Therapy (Addendum)
[place] MAIN [facility] SERVICES [address] [place], [place], [address] Phone: [phone]   Fax:  [phone]  Physical Therapy Treatment  Patient Details  Name: Rebekah Perkins MRN: [id number] Date of Birth: [birth year] Referring Provider (PT): Meeler, [place], Carlean Jews    Encounter Date: 07/16/2020   PT End of Session - 07/16/20 0806    Visit Number 2    Number of Visits 16    Date for PT Re-Evaluation 09/08/20    Authorization Type 1/10 eval 8/31    PT Start Time 0800    PT Stop Time 0845    PT Time Calculation (min) 45 min    Activity Tolerance Patient limited by pain    Behavior During Therapy [facility] for tasks assessed/performed           Past Medical History:  Diagnosis Date  . Anxiety   . Apnea   . Diverticulitis   . Environmental allergies   . Hypertension   . Menopausal state   . Obesity   . PMB (postmenopausal bleeding)   . PVD (peripheral vascular disease) ([place])   . Rosacea   . Simple endometrial hyperplasia   . Thyroid disease   . Urinary urgency     Past Surgical History:  Procedure Laterality Date  . CHOLECYSTECTOMY    . DILATION AND CURETTAGE OF UTERUS    . HYSTEROSCOPY      There were no vitals filed for this visit.   Subjective Assessment - 07/16/20 0805    Subjective Patient reports 2/10 to low back and L leg. It is sometimes as high as a 9/10    Pertinent History Patient is a pleasant 64 year old female who presents for DDD, back muscle spasm, and lumbar radiculitis. Patient has seen this therapist in the past for her shoulder. Patient has history of back pain however beginning in June 2021 she has had extreme exacerbation that has limited her quality of life. Patient is getting an MRI on Sept 14th, 2021 as one has not been completed since 2015. Pain is described as radiating down left and right LE's with left side being primary source of radiating pain. Has not had any episodes of bowel and bladder but does feel  weakness of legs. She also describes pain at the lateral hips interferes with her ability to lie on her sides. Sitting feels like she is being stabbed. Leaning forward is additionally painful. Laying down relieves pain when flat on back. 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    Limitations Sitting;Lifting;Walking;House hold activities;Other (comment)    How long can you sit comfortably? 5 minutes severe pain: feels sitting on sharp rocks    How long can you stand comfortably? not limited by back pain    How long can you walk comfortably? loosens with prolonged walking    Diagnostic tests awaiting MRI onthe 14th    Patient Stated Goals to reduce pain, be able to sit again    Currently in Pain? Yes    Pain Score 2     Pain Location Back    Pain Orientation Lower;Left    Pain Descriptors / Indicators Aching    Pain Type Chronic pain    Pain Radiating Towards L calf    Pain Onset More than a month ago    Aggravating Factors  sitting    Pain Relieving Factors laying on back    Effect of Pain on Daily Activities limits activities  Treatment; Prone back extension x 10  Prone back extension x 10 with manual over pressure  Standing lumbar extension x 10   Manual; STM to low back  PA glides x L1-L5, L5- S1 grade 3 x 30 bouts   Patient has no LE pain following treatment and 0/10 back pain following treatment                       PT Education - 07/16/20 0806    Education Details HEP    Person(s) Educated Patient    Methods Explanation    Comprehension Verbalized understanding            PT Short Term Goals - 07/14/20 1752      PT SHORT TERM GOAL #1   Title Patient will be independent in home exercise program to improve strength/mobility for better functional independence with ADLs.    Baseline 8/31: HEP given    Time 4    Period Weeks    Status New    Target Date 08/11/20             PT Long Term Goals -  07/14/20 1752      PT LONG TERM GOAL #1   Title Patient will increase FOTO score to equal to or greater than  64%   to demonstrate statistically significant improvement in mobility and quality of life.    Baseline 8/31: 51%    Time 8    Period Weeks    Status New    Target Date 09/08/20      PT LONG TERM GOAL #2   Title Patient will report a worst pain of 3/10 on VAS in lumbar region to improve tolerance with ADLs and reduced symptoms with activities.    Baseline 8/31: 10/10    Time 8    Period Weeks    Status New    Target Date 09/08/20      PT LONG TERM GOAL #3   Title Patient will reduce modified Oswestry score to <20 as to demonstrate minimal disability with ADLs including improved sleeping tolerance, walking/sitting tolerance etc for better mobility with ADLs    Baseline 8/31: 40%    Time 8    Period Weeks    Status New    Target Date 09/08/20      PT LONG TERM GOAL #4   Title Patient will increase BLE gross strength to 4+/5 as to improve functional strength for independent gait, increased standing tolerance and increased ADL ability.    Baseline 8/31: see note    Time 8    Period Weeks    Status New    Target Date 09/08/20                 Plan - 07/16/20 0807    Clinical Impression Statement Patient presents with decreased gait speed and antalgic gait due to LLE pain and low back pain. She responds to manual therapy and lumbar extension exercises. She is educated about posture/ice/positioning and HEP. She will continue to benefit from skilled PT to improve mobility and decrease pain .    Personal Factors and Comorbidities Age;Comorbidity 3+;Finances;Fitness;Past/Current Experience;Profession;Time since onset of injury/illness/exacerbation    Comorbidities anxiety, HTN, obesity, PVD, thyroid disease, diverticulitis, HLD, bursitis trochanteric    Examination-Activity Limitations Caring for Others;Dressing;Squat;Stand;Transfers;Other;Bed Mobility;Locomotion  Level;Lift;Stairs    Examination-Participation Restrictions Church;Cleaning;Community Activity;Driving;Meal Prep;Laundry;Shop;Yard Work;Other    Stability/Clinical Decision Making Evolving/Moderate complexity    Rehab Potential Fair  PT Frequency 2x / week    PT Duration 8 weeks    PT Treatment/Interventions ADLs/Self Care Home Management;Aquatic Therapy;Biofeedback;Cryotherapy;Electrical Stimulation;Iontophoresis 4mg /ml Dexamethasone;Moist Heat;Traction;Ultrasound;Contrast Bath;Therapeutic activities;Therapeutic exercise;Manual techniques;Patient/family education;Manual lymph drainage;Compression bandaging;Passive range of motion;Dry needling;Vasopneumatic Device;Taping;Splinting;Energy conservation;Visual/perceptual remediation/compensation;Joint Manipulations;Gait training;Stair training;Functional mobility training;Neuromuscular re-education;Balance training;Spinal Manipulations    PT Next Visit Plan STM low back, repeated trunk extension, LE lengthening    PT Home Exercise Plan see above    Consulted and Agree with Plan of Care Patient           Patient will benefit from skilled therapeutic intervention in order to improve the following deficits and impairments:  Decreased activity tolerance, Decreased endurance, Decreased mobility, Decreased range of motion, Decreased strength, Hypomobility, Increased edema, Impaired flexibility, Impaired perceived functional ability, Increased muscle spasms, Obesity, Postural dysfunction, Improper body mechanics, Pain, Abnormal gait, Decreased balance, Difficulty walking  Visit Diagnosis: Low back pain, unspecified back pain laterality, unspecified chronicity, unspecified whether sciatica present  Abnormal posture  Muscle weakness (generalized)  Stiffness of left shoulder, not elsewhere classified  Acute pain of left shoulder     Problem List Patient Active Problem List   Diagnosis Date Noted  . Endometrial hyperplasia without atypia,  simple 07/21/2015  . Diverticulosis 07/21/2015  . Neuritis or radiculitis due to rupture of lumbar intervertebral disc 11/12/2014  . Lumbar canal stenosis 11/12/2014  . Bursitis, trochanteric 11/12/2014  . Back ache 04/10/2014  . Chronic anemia 04/10/2014  . BP (high blood pressure) 04/10/2014  . Blood glucose elevated 04/10/2014  . HLD (hyperlipidemia) 04/10/2014  . Adult hypothyroidism 04/10/2014  . Adiposity 04/10/2014  . Obstructive apnea 04/10/2014    Alanson Puls, [place] DPT 07/16/2020, 8:24 AM  [place] MAIN [facility] SERVICES [address] [place], [place], [address] Phone: [phone]   Fax:  [phone]  Name: RUDEAN ICENHOUR MRN: [id number] Date of Birth: 03/07/1956

## 2020-07-22 ENCOUNTER — Other Ambulatory Visit: Payer: Self-pay

## 2020-07-22 ENCOUNTER — Encounter: Payer: Self-pay | Admitting: Physical Therapy

## 2020-07-22 ENCOUNTER — Ambulatory Visit: Payer: 59 | Admitting: Physical Therapy

## 2020-07-22 DIAGNOSIS — M25512 Pain in left shoulder: Secondary | ICD-10-CM

## 2020-07-22 DIAGNOSIS — M6281 Muscle weakness (generalized): Secondary | ICD-10-CM

## 2020-07-22 DIAGNOSIS — R293 Abnormal posture: Secondary | ICD-10-CM | POA: Diagnosis not present

## 2020-07-22 DIAGNOSIS — M25612 Stiffness of left shoulder, not elsewhere classified: Secondary | ICD-10-CM | POA: Diagnosis not present

## 2020-07-22 DIAGNOSIS — M545 Low back pain, unspecified: Secondary | ICD-10-CM

## 2020-07-22 NOTE — Therapy (Signed)
[place] MAIN [facility] SERVICES [address] [place], [place], [address] Phone: [phone]   Fax:  [phone]  Physical Therapy Treatment  Patient Details  Name: Rebekah Perkins MRN: [id number] Date of Birth: August 27, 1956 Referring Provider (PT): Meeler, [place], Carlean Jews    Encounter Date: 07/22/2020   PT End of Session - 07/22/20 1012    Visit Number 3    Number of Visits 16    Date for PT Re-Evaluation 09/08/20    Authorization Type 1/10 eval 8/31    Equipment Utilized During Treatment Gait belt    Activity Tolerance Patient limited by pain    Behavior During Therapy [facility] for tasks assessed/performed           Past Medical History:  Diagnosis Date  . Anxiety   . Apnea   . Diverticulitis   . Environmental allergies   . Hypertension   . Menopausal state   . Obesity   . PMB (postmenopausal bleeding)   . PVD (peripheral vascular disease) ([place])   . Rosacea   . Simple endometrial hyperplasia   . Thyroid disease   . Urinary urgency     Past Surgical History:  Procedure Laterality Date  . CHOLECYSTECTOMY    . DILATION AND CURETTAGE OF UTERUS    . HYSTEROSCOPY      There were no vitals filed for this visit.   Subjective Assessment - 07/22/20 0950    Subjective Patient reports 2/10 to low back and L leg. It is sometimes as high as a 9/10    Currently in Pain? Yes    Pain Score 2     Pain Location Leg    Pain Orientation Left    Pain Descriptors / Indicators Aching    Pain Type Chronic pain    Pain Radiating Towards down to thigh    Pain Frequency Intermittent    Aggravating Factors  sitting    Pain Relieving Factors laying on back    Effect of Pain on Daily Activities limits activities             Therapeutic exercise; hooklying marching with TA x 20 x 2  TA contraction x 10 with 5 sec hold Hooklying ER/abd with TA x 20  hooklying clam with TA x 20 Ice to low back   Manual therpay  STM to low back  PA glides x  L1-L5, L5- S1 grade 3 x 30 bouts Patient has no LE pain following treatment and 0/10 back pain following treatment   Patient performed with instruction, verbal cues, tactile cues of therapist: goal: increase tissue extensibility, promote proper posture, improve mobility            PT Education - 07/22/20 0951    Education Details HEP    Person(s) Educated Patient    Methods Explanation    Comprehension Verbalized understanding            PT Short Term Goals - 07/14/20 1752      PT SHORT TERM GOAL #1   Title Patient will be independent in home exercise program to improve strength/mobility for better functional independence with ADLs.    Baseline 8/31: HEP given    Time 4    Period Weeks    Status New    Target Date 08/11/20             PT Long Term Goals - 07/14/20 1752      PT LONG TERM GOAL #1  Title Patient will increase FOTO score to equal to or greater than  64%   to demonstrate statistically significant improvement in mobility and quality of life.    Baseline 8/31: 51%    Time 8    Period Weeks    Status New    Target Date 09/08/20      PT LONG TERM GOAL #2   Title Patient will report a worst pain of 3/10 on VAS in lumbar region to improve tolerance with ADLs and reduced symptoms with activities.    Baseline 8/31: 10/10    Time 8    Period Weeks    Status New    Target Date 09/08/20      PT LONG TERM GOAL #3   Title Patient will reduce modified Oswestry score to <20 as to demonstrate minimal disability with ADLs including improved sleeping tolerance, walking/sitting tolerance etc for better mobility with ADLs    Baseline 8/31: 40%    Time 8    Period Weeks    Status New    Target Date 09/08/20      PT LONG TERM GOAL #4   Title Patient will increase BLE gross strength to 4+/5 as to improve functional strength for independent gait, increased standing tolerance and increased ADL ability.    Baseline 8/31: see note    Time 8    Period Weeks     Status New    Target Date 09/08/20                 Plan - 07/22/20 7001    Clinical Impression Statement Patient has RLE pain down thigh 2/10 . Patient responds to manual therapy with reduced symptoms of leg pain. Patient is able to perform core stabilization exercises. She will continue to benefit from skilled PT to improve mobility and decrease pain.    Personal Factors and Comorbidities Age;Comorbidity 3+;Finances;Fitness;Past/Current Experience;Profession;Time since onset of injury/illness/exacerbation    Comorbidities anxiety, HTN, obesity, PVD, thyroid disease, diverticulitis, HLD, bursitis trochanteric    Examination-Activity Limitations Caring for Others;Dressing;Squat;Stand;Transfers;Other;Bed Mobility;Locomotion Level;Lift;Stairs    Examination-Participation Restrictions Church;Cleaning;Community Activity;Driving;Meal Prep;Laundry;Shop;Yard Work;Other    Stability/Clinical Decision Making Evolving/Moderate complexity    Rehab Potential Fair    PT Frequency 2x / week    PT Duration 8 weeks    PT Treatment/Interventions ADLs/Self Care Home Management;Aquatic Therapy;Biofeedback;Cryotherapy;Electrical Stimulation;Iontophoresis 4mg /ml Dexamethasone;Moist Heat;Traction;Ultrasound;Contrast Bath;Therapeutic activities;Therapeutic exercise;Manual techniques;Patient/family education;Manual lymph drainage;Compression bandaging;Passive range of motion;Dry needling;Vasopneumatic Device;Taping;Splinting;Energy conservation;Visual/perceptual remediation/compensation;Joint Manipulations;Gait training;Stair training;Functional mobility training;Neuromuscular re-education;Balance training;Spinal Manipulations    PT Next Visit Plan STM low back, repeated trunk extension, LE lengthening    PT Home Exercise Plan see above    Consulted and Agree with Plan of Care Patient           Patient will benefit from skilled therapeutic intervention in order to improve the following deficits and  impairments:  Decreased activity tolerance, Decreased endurance, Decreased mobility, Decreased range of motion, Decreased strength, Hypomobility, Increased edema, Impaired flexibility, Impaired perceived functional ability, Increased muscle spasms, Obesity, Postural dysfunction, Improper body mechanics, Pain, Abnormal gait, Decreased balance, Difficulty walking  Visit Diagnosis: Low back pain, unspecified back pain laterality, unspecified chronicity, unspecified whether sciatica present  Abnormal posture  Muscle weakness (generalized)  Acute pain of left shoulder  Stiffness of left shoulder, not elsewhere classified     Problem List Patient Active Problem List   Diagnosis Date Noted  . Endometrial hyperplasia without atypia, simple 07/21/2015  . Diverticulosis 07/21/2015  . Neuritis or radiculitis due  to rupture of lumbar intervertebral disc 11/12/2014  . Lumbar canal stenosis 11/12/2014  . Bursitis, trochanteric 11/12/2014  . Back ache 04/10/2014  . Chronic anemia 04/10/2014  . BP (high blood pressure) 04/10/2014  . Blood glucose elevated 04/10/2014  . HLD (hyperlipidemia) 04/10/2014  . Adult hypothyroidism 04/10/2014  . Adiposity 04/10/2014  . Obstructive apnea 04/10/2014    Alanson Puls, [place] DPT 07/22/2020, 10:14 AM  [place] MAIN [facility] SERVICES [address] [place], [place], [address] Phone: [phone]   Fax:  [phone]  Name: Rebekah Perkins MRN: [id number] Date of Birth: 04-Jun-1956

## 2020-07-23 ENCOUNTER — Other Ambulatory Visit: Payer: Self-pay | Admitting: Internal Medicine

## 2020-07-27 ENCOUNTER — Encounter: Payer: Self-pay | Admitting: Physical Therapy

## 2020-07-27 ENCOUNTER — Other Ambulatory Visit: Payer: Self-pay

## 2020-07-27 ENCOUNTER — Ambulatory Visit: Payer: 59 | Admitting: Physical Therapy

## 2020-07-27 DIAGNOSIS — M545 Low back pain, unspecified: Secondary | ICD-10-CM

## 2020-07-27 DIAGNOSIS — R293 Abnormal posture: Secondary | ICD-10-CM

## 2020-07-27 DIAGNOSIS — M25512 Pain in left shoulder: Secondary | ICD-10-CM | POA: Diagnosis not present

## 2020-07-27 DIAGNOSIS — M25612 Stiffness of left shoulder, not elsewhere classified: Secondary | ICD-10-CM | POA: Diagnosis not present

## 2020-07-27 DIAGNOSIS — M6281 Muscle weakness (generalized): Secondary | ICD-10-CM

## 2020-07-27 NOTE — Therapy (Signed)
[place] MAIN [facility] SERVICES [address] [place], [place], [address] Phone: [phone]   Fax:  [phone]  Physical Therapy Treatment  Patient Details  Name: Rebekah Perkins MRN: [id number] Date of Birth: 1956/02/25 Referring Provider (PT): Meeler, [place], Carlean Jews    Encounter Date: 07/27/2020   PT End of Session - 07/27/20 0814    Visit Number 4    Number of Visits 16    Date for PT Re-Evaluation 09/08/20    Authorization Type 1/10 eval 8/31    PT Start Time 0800    PT Stop Time 0845    PT Time Calculation (min) 45 min    Equipment Utilized During Treatment Gait belt    Activity Tolerance Patient limited by pain    Behavior During Therapy WFL for tasks assessed/performed           Past Medical History:  Diagnosis Date   Anxiety    Apnea    Diverticulitis    Environmental allergies    Hypertension    Menopausal state    Obesity    PMB (postmenopausal bleeding)    PVD (peripheral vascular disease) ([place])    Rosacea    Simple endometrial hyperplasia    Thyroid disease    Urinary urgency     Past Surgical History:  Procedure Laterality Date   CHOLECYSTECTOMY     DILATION AND CURETTAGE OF UTERUS     HYSTEROSCOPY      There were no vitals filed for this visit.   Subjective Assessment - 07/27/20 0812    Subjective Patient reports 2/10 to low back and L leg. and over the weekend it was less painful.    Pertinent History Patient is a pleasant 64 year old female who presents for DDD, back muscle spasm, and lumbar radiculitis. Patient has seen this therapist in the past for her shoulder. Patient has history of back pain however beginning in June 2021 she has had extreme exacerbation that has limited her quality of life. Patient is getting an MRI on Sept 14th, 2021 as one has not been completed since 2015. Pain is described as radiating down left and right LE's with left side being primary source of radiating pain.  Has not had any episodes of bowel and bladder but does feel weakness of legs. She also describes pain at the lateral hips interferes with her ability to lie on her sides. Sitting feels like she is being stabbed. Leaning forward is additionally painful. Laying down relieves pain when flat on back. 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    Limitations Sitting;Lifting;Walking;House hold activities;Other (comment)    How long can you sit comfortably? 5 minutes severe pain: feels sitting on sharp rocks    How long can you stand comfortably? not limited by back pain    How long can you walk comfortably? loosens with prolonged walking    Diagnostic tests awaiting MRI onthe 14th    Patient Stated Goals to reduce pain, be able to sit again    Currently in Pain? No/denies    Pain Score 0-No pain    Pain Location Leg    Pain Onset More than a month ago            Therapeutic exercise; hooklying marching with TA x 20 x 2  TA contraction x 10 with 5 sec hold Hooklying ER/abd with TA x 20  hooklying clam with TA x 20 Ice to low back  Manual therpay  STM to low back  PA glides x L1-L5, L5- S1grade 3 x 30 bouts Left LE in frog position and PA glides to L5-s1 x 30 and overpressure to L5-S1 Standing lumbar extension x 10 with emphasis on bending and moving and not holding Patient has no LE pain following treatment and 0/10 back pain following treatment  Patient performed with instruction, verbal cues, tactile cues of therapist: goal:increase tissue extensibility, promote proper posture, improve mobility                          PT Education - 07/27/20 0813    Education Details HEP    Person(s) Educated Patient    Methods Explanation    Comprehension Verbalized understanding;Returned demonstration;Tactile cues required;Verbal cues required;Need further instruction            PT Short Term Goals - 07/14/20 1752      PT SHORT TERM GOAL #1    Title Patient will be independent in home exercise program to improve strength/mobility for better functional independence with ADLs.    Baseline 8/31: HEP given    Time 4    Period Weeks    Status New    Target Date 08/11/20             PT Long Term Goals - 07/14/20 1752      PT LONG TERM GOAL #1   Title Patient will increase FOTO score to equal to or greater than  64%   to demonstrate statistically significant improvement in mobility and quality of life.    Baseline 8/31: 51%    Time 8    Period Weeks    Status New    Target Date 09/08/20      PT LONG TERM GOAL #2   Title Patient will report a worst pain of 3/10 on VAS in lumbar region to improve tolerance with ADLs and reduced symptoms with activities.    Baseline 8/31: 10/10    Time 8    Period Weeks    Status New    Target Date 09/08/20      PT LONG TERM GOAL #3   Title Patient will reduce modified Oswestry score to <20 as to demonstrate minimal disability with ADLs including improved sleeping tolerance, walking/sitting tolerance etc for better mobility with ADLs    Baseline 8/31: 40%    Time 8    Period Weeks    Status New    Target Date 09/08/20      PT LONG TERM GOAL #4   Title Patient will increase BLE gross strength to 4+/5 as to improve functional strength for independent gait, increased standing tolerance and increased ADL ability.    Baseline 8/31: see note    Time 8    Period Weeks    Status New    Target Date 09/08/20                 Plan - 07/27/20 0814    Clinical Impression Statement Patient responds to manual therapy with less pain intensity down her left leg. Patient is able to perform better lumbar extension in standing to reach the L5-S1 for full extension. She has less pain down L leg is not hurting but is sore following therapy. She will continue to benefit form skilled PT to improve mobility and strength.    Personal Factors and Comorbidities Age;Comorbidity  3+;Finances;Fitness;Past/Current Experience;Profession;Time since onset of injury/illness/exacerbation    Comorbidities anxiety,  HTN, obesity, PVD, thyroid disease, diverticulitis, HLD, bursitis trochanteric    Examination-Activity Limitations Caring for Others;Dressing;Squat;Stand;Transfers;Other;Bed Mobility;Locomotion Level;Lift;Stairs    Examination-Participation Restrictions Church;Cleaning;Community Activity;Driving;Meal Prep;Laundry;Shop;Yard Work;Other    Stability/Clinical Decision Making Evolving/Moderate complexity    Rehab Potential Fair    PT Frequency 2x / week    PT Duration 8 weeks    PT Treatment/Interventions ADLs/Self Care Home Management;Aquatic Therapy;Biofeedback;Cryotherapy;Electrical Stimulation;Iontophoresis 4mg /ml Dexamethasone;Moist Heat;Traction;Ultrasound;Contrast Bath;Therapeutic activities;Therapeutic exercise;Manual techniques;Patient/family education;Manual lymph drainage;Compression bandaging;Passive range of motion;Dry needling;Vasopneumatic Device;Taping;Splinting;Energy conservation;Visual/perceptual remediation/compensation;Joint Manipulations;Gait training;Stair training;Functional mobility training;Neuromuscular re-education;Balance training;Spinal Manipulations    PT Next Visit Plan STM low back, repeated trunk extension, LE lengthening    PT Home Exercise Plan see above    Consulted and Agree with Plan of Care Patient           Patient will benefit from skilled therapeutic intervention in order to improve the following deficits and impairments:  Decreased activity tolerance, Decreased endurance, Decreased mobility, Decreased range of motion, Decreased strength, Hypomobility, Increased edema, Impaired flexibility, Impaired perceived functional ability, Increased muscle spasms, Obesity, Postural dysfunction, Improper body mechanics, Pain, Abnormal gait, Decreased balance, Difficulty walking  Visit Diagnosis: Low back pain, unspecified back pain laterality,  unspecified chronicity, unspecified whether sciatica present  Abnormal posture  Muscle weakness (generalized)  Stiffness of left shoulder, not elsewhere classified  Acute pain of left shoulder     Problem List Patient Active Problem List   Diagnosis Date Noted   Endometrial hyperplasia without atypia, simple 07/21/2015   Diverticulosis 07/21/2015   Neuritis or radiculitis due to rupture of lumbar intervertebral disc 11/12/2014   Lumbar canal stenosis 11/12/2014   Bursitis, trochanteric 11/12/2014   Back ache 04/10/2014   Chronic anemia 04/10/2014   BP (high blood pressure) 04/10/2014   Blood glucose elevated 04/10/2014   HLD (hyperlipidemia) 04/10/2014   Adult hypothyroidism 04/10/2014   Adiposity 04/10/2014   Obstructive apnea 04/10/2014    Alanson Puls, PT DPT 07/27/2020, 8:25 AM  [place], [place], [address] Phone: [phone]   Fax:  [phone]  Name: Rebekah Perkins MRN: [id number] Date of Birth: 1956/01/31

## 2020-07-28 ENCOUNTER — Ambulatory Visit
Admission: RE | Admit: 2020-07-28 | Discharge: 2020-07-28 | Disposition: A | Discharge status: Home / Self Care | Payer: 59 | Source: Ambulatory Visit | Attending: Family Medicine | Admitting: Family Medicine

## 2020-07-28 ENCOUNTER — Other Ambulatory Visit: Payer: Self-pay

## 2020-07-28 DIAGNOSIS — G8929 Other chronic pain: Secondary | ICD-10-CM | POA: Diagnosis not present

## 2020-07-28 DIAGNOSIS — M5442 Lumbago with sciatica, left side: Secondary | ICD-10-CM | POA: Insufficient documentation

## 2020-07-28 DIAGNOSIS — M5136 Other intervertebral disc degeneration, lumbar region: Secondary | ICD-10-CM | POA: Diagnosis not present

## 2020-07-28 DIAGNOSIS — M545 Low back pain: Secondary | ICD-10-CM | POA: Diagnosis not present

## 2020-07-29 ENCOUNTER — Ambulatory Visit: Payer: 59 | Admitting: Physical Therapy

## 2020-07-29 ENCOUNTER — Encounter: Payer: Self-pay | Admitting: Physical Therapy

## 2020-07-29 DIAGNOSIS — R293 Abnormal posture: Secondary | ICD-10-CM | POA: Diagnosis not present

## 2020-07-29 DIAGNOSIS — M25512 Pain in left shoulder: Secondary | ICD-10-CM | POA: Diagnosis not present

## 2020-07-29 DIAGNOSIS — M545 Low back pain, unspecified: Secondary | ICD-10-CM

## 2020-07-29 DIAGNOSIS — M6281 Muscle weakness (generalized): Secondary | ICD-10-CM | POA: Diagnosis not present

## 2020-07-29 DIAGNOSIS — M25612 Stiffness of left shoulder, not elsewhere classified: Secondary | ICD-10-CM | POA: Diagnosis not present

## 2020-07-29 NOTE — Therapy (Signed)
[place] MAIN [facility] SERVICES [address] [place], [place], [address] Phone: [phone]   Fax:  [phone]  Physical Therapy Treatment  Patient Details  Name: Rebekah Perkins MRN: [id number] Date of Birth: 11/23/1955 Referring Provider (PT): Me[place], [place], Carlean Jews    Encounter Date: 07/29/2020   PT End of Session - 07/29/20 0831    Visit Number 5    Number of Visits 16    Date for PT Re-Evaluation 09/08/20    Authorization Type 1/10 eval 8/31    PT Start Time 0800    PT Stop Time 0845    PT Time Calculation (min) 45 min    Equipment Utilized During Treatment Gait belt    Activity Tolerance Patient limited by pain    Behavior During Therapy [facility] for tasks assessed/performed           Past Medical History:  Diagnosis Date  . Anxiety   . Apnea   . Diverticulitis   . Environmental allergies   . Hypertension   . Menopausal state   . Obesity   . PMB (postmenopausal bleeding)   . PVD (peripheral vascular disease) ([place])   . Rosacea   . Simple endometrial hyperplasia   . Thyroid disease   . Urinary urgency     Past Surgical History:  Procedure Laterality Date  . CHOLECYSTECTOMY    . DILATION AND CURETTAGE OF UTERUS    . HYSTEROSCOPY      There were no vitals filed for this visit.   Subjective Assessment - 07/29/20 0831    Subjective Patient reports 2/10 to low back and L leg. and over the weekend it was less painful.    Pertinent History Patient is a pleasant 64 year old female who presents for DDD, back muscle spasm, and lumbar radiculitis. Patient has seen this therapist in the past for her shoulder. Patient has history of back pain however beginning in June 2021 she has had extreme exacerbation that has limited her quality of life. Patient is getting an MRI on Sept 14th, 2021 as one has not been completed since 2015. Pain is described as radiating down left and right LE's with left side being primary source of radiating pain.  Has not had any episodes of bowel and bladder but does feel weakness of legs. She also describes pain at the lateral hips interferes with her ability to lie on her sides. Sitting feels like she is being stabbed. Leaning forward is additionally painful. Laying down relieves pain when flat on back. 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    Limitations Sitting;Lifting;Walking;House hold activities;Other (comment)    How long can you sit comfortably? 5 minutes severe pain: feels sitting on sharp rocks    How long can you stand comfortably? not limited by back pain    How long can you walk comfortably? loosens with prolonged walking    Diagnostic tests awaiting MRI onthe 14th    Patient Stated Goals to reduce pain, be able to sit again    Currently in Pain? No/denies    Pain Score 0-No pain    Pain Onset More than a month ago           Therapeutic exercise; hooklying marching with TA x 20 x 2  TA contraction x 10 with 5 sec hold Hooklying ER/abd with TA x 20  hooklying clam with TA x 20 Ice to low back   Manual therpay  STM to  low back  PA glides x L1-L5, L5- S1grade 3 x 30 bouts Left LE in frog position and PA glides to L5-s1 x 30 and overpressure to L5-S1 Standing lumbar extension x 10 with emphasis on bending and moving and not holding Patient has no LE pain following treatment and 0/10 back pain following treatment Patient performed with instruction, verbal cues, tactile cues of therapist: goal:increase tissue extensibility, promote proper posture, improve mobility                          PT Education - 07/29/20 0831    Education Details HEP    Person(s) Educated Patient    Methods Explanation    Comprehension Need further instruction;Tactile cues required            PT Short Term Goals - 07/14/20 1752      PT SHORT TERM GOAL #1   Title Patient will be independent in home exercise program to improve strength/mobility  for better functional independence with ADLs.    Baseline 8/31: HEP given    Time 4    Period Weeks    Status New    Target Date 08/11/20             PT Long Term Goals - 07/14/20 1752      PT LONG TERM GOAL #1   Title Patient will increase FOTO score to equal to or greater than  64%   to demonstrate statistically significant improvement in mobility and quality of life.    Baseline 8/31: 51%    Time 8    Period Weeks    Status New    Target Date 09/08/20      PT LONG TERM GOAL #2   Title Patient will report a worst pain of 3/10 on VAS in lumbar region to improve tolerance with ADLs and reduced symptoms with activities.    Baseline 8/31: 10/10    Time 8    Period Weeks    Status New    Target Date 09/08/20      PT LONG TERM GOAL #3   Title Patient will reduce modified Oswestry score to <20 as to demonstrate minimal disability with ADLs including improved sleeping tolerance, walking/sitting tolerance etc for better mobility with ADLs    Baseline 8/31: 40%    Time 8    Period Weeks    Status New    Target Date 09/08/20      PT LONG TERM GOAL #4   Title Patient will increase BLE gross strength to 4+/5 as to improve functional strength for independent gait, increased standing tolerance and increased ADL ability.    Baseline 8/31: see note    Time 8    Period Weeks    Status New    Target Date 09/08/20                 Plan - 07/29/20 4503    Clinical Impression Statement Patient responds to manual therapy with 0/10 pain intensity down her left leg. Patient is able to perform better lumbar extension in standing to reach the L5-S1 for full extension. She has no pain down L leg is not hurting but is a tired/fatigued feeling following therapy. She will continue to benefit form skilled PT to improve mobility and strength.   Personal Factors and Comorbidities Age;Comorbidity 3+;Finances;Fitness;Past/Current Experience;Profession;Time since onset of  injury/illness/exacerbation    Comorbidities anxiety, HTN, obesity, PVD, thyroid disease, diverticulitis, HLD, bursitis trochanteric  Examination-Activity Limitations Caring for Others;Dressing;Squat;Stand;Transfers;Other;Bed Mobility;Locomotion Level;Lift;Stairs    Examination-Participation Restrictions Church;Cleaning;Community Activity;Driving;Meal Prep;Laundry;Shop;Yard Work;Other    Stability/Clinical Decision Making Evolving/Moderate complexity    Rehab Potential Fair    PT Frequency 2x / week    PT Duration 8 weeks    PT Treatment/Interventions ADLs/Self Care Home Management;Aquatic Therapy;Biofeedback;Cryotherapy;Electrical Stimulation;Iontophoresis 4mg /ml Dexamethasone;Moist Heat;Traction;Ultrasound;Contrast Bath;Therapeutic activities;Therapeutic exercise;Manual techniques;Patient/family education;Manual lymph drainage;Compression bandaging;Passive range of motion;Dry needling;Vasopneumatic Device;Taping;Splinting;Energy conservation;Visual/perceptual remediation/compensation;Joint Manipulations;Gait training;Stair training;Functional mobility training;Neuromuscular re-education;Balance training;Spinal Manipulations    PT Next Visit Plan STM low back, repeated trunk extension, LE lengthening    PT Home Exercise Plan see above    Consulted and Agree with Plan of Care Patient           Patient will benefit from skilled therapeutic intervention in order to improve the following deficits and impairments:  Decreased activity tolerance, Decreased endurance, Decreased mobility, Decreased range of motion, Decreased strength, Hypomobility, Increased edema, Impaired flexibility, Impaired perceived functional ability, Increased muscle spasms, Obesity, Postural dysfunction, Improper body mechanics, Pain, Abnormal gait, Decreased balance, Difficulty walking  Visit Diagnosis: Low back pain, unspecified back pain laterality, unspecified chronicity, unspecified whether sciatica present  Abnormal  posture  Muscle weakness (generalized)     Problem List Patient Active Problem List   Diagnosis Date Noted  . Endometrial hyperplasia without atypia, simple 07/21/2015  . Diverticulosis 07/21/2015  . Neuritis or radiculitis due to rupture of lumbar intervertebral disc 11/12/2014  . Lumbar canal stenosis 11/12/2014  . Bursitis, trochanteric 11/12/2014  . Back ache 04/10/2014  . Chronic anemia 04/10/2014  . BP (high blood pressure) 04/10/2014  . Blood glucose elevated 04/10/2014  . HLD (hyperlipidemia) 04/10/2014  . Adult hypothyroidism 04/10/2014  . Adiposity 04/10/2014  . Obstructive apnea 04/10/2014    Alanson Puls, [place] DPT 07/29/2020, 8:33 AM  [place] MAIN [facility] SERVICES [address] [place], [place], [address] Phone: [phone]   Fax:  [phone]  Name: Rebekah Perkins MRN: [id number] Date of Birth: [birth year]

## 2020-08-04 ENCOUNTER — Encounter: Payer: Self-pay | Admitting: Physical Therapy

## 2020-08-04 ENCOUNTER — Other Ambulatory Visit: Payer: Self-pay

## 2020-08-04 ENCOUNTER — Ambulatory Visit: Payer: 59 | Admitting: Physical Therapy

## 2020-08-04 DIAGNOSIS — M6281 Muscle weakness (generalized): Secondary | ICD-10-CM

## 2020-08-04 DIAGNOSIS — R293 Abnormal posture: Secondary | ICD-10-CM

## 2020-08-04 DIAGNOSIS — M25512 Pain in left shoulder: Secondary | ICD-10-CM

## 2020-08-04 DIAGNOSIS — M25612 Stiffness of left shoulder, not elsewhere classified: Secondary | ICD-10-CM

## 2020-08-04 DIAGNOSIS — M545 Low back pain, unspecified: Secondary | ICD-10-CM

## 2020-08-04 NOTE — Therapy (Signed)
[place] MAIN [facility] SERVICES [address] [place], [place], [address] Phone: [phone]   Fax:  [phone]  Physical Therapy Treatment  Patient Details  Name: Rebekah Perkins MRN: [id number] Date of Birth: 04-08-1956 Referring Provider (PT): [place], [place], Carlean Jews    Encounter Date: 08/04/2020   PT End of Session - 08/04/20 0849    Visit Number 6    Number of Visits 16    Date for PT Re-Evaluation 09/08/20    Authorization Type 1/10 eval 8/31    PT Start Time 0845    PT Stop Time 0925    PT Time Calculation (min) 40 min    Equipment Utilized During Treatment Gait belt    Activity Tolerance Patient limited by pain    Behavior During Therapy WFL for tasks assessed/performed           Past Medical History:  Diagnosis Date   Anxiety    Apnea    Diverticulitis    Environmental allergies    Hypertension    Menopausal state    Obesity    PMB (postmenopausal bleeding)    PVD (peripheral vascular disease) ([place])    Rosacea    Simple endometrial hyperplasia    Thyroid disease    Urinary urgency     Past Surgical History:  Procedure Laterality Date   CHOLECYSTECTOMY     DILATION AND CURETTAGE OF UTERUS     HYSTEROSCOPY      There were no vitals filed for this visit.   Subjective Assessment - 08/04/20 0849    Subjective Patient is not having any discomfot today.    Pertinent History Patient is a pleasant 64 year old female who presents for DDD, back muscle spasm, and lumbar radiculitis. Patient has seen this therapist in the past for her shoulder. Patient has history of back pain however beginning in June 2021 she has had extreme exacerbation that has limited her quality of life. Patient is getting an MRI on Sept 14th, 2021 as one has not been completed since 2015. Pain is described as radiating down left and right LE's with left side being primary source of radiating pain. Has not had any episodes of bowel and bladder  but does feel weakness of legs. She also describes pain at the lateral hips interferes with her ability to lie on her sides. Sitting feels like she is being stabbed. Leaning forward is additionally painful. Laying down relieves pain when flat on back. 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    Limitations Sitting;Lifting;Walking;House hold activities;Other (comment)    How long can you sit comfortably? 5 minutes severe pain: feels sitting on sharp rocks    How long can you stand comfortably? not limited by back pain    How long can you walk comfortably? loosens with prolonged walking    Diagnostic tests awaiting MRI onthe 14th    Patient Stated Goals to reduce pain, be able to sit again    Currently in Pain? No/denies    Pain Score 0-No pain    Pain Onset More than a month ago           Therapeutic exercise; hooklying marching with TA x 20 x 2  TA contraction x 10 with 5 sec hold Hooklying ER/abd with TA x 20  hooklying clam with TA x 20 Ice to low back   Manual therapy  STM to low back  PA glides x L1-L5, L5- S1grade 3  x 30 bouts Left LE in frog position and PA glides to L5-s1 x 30 and overpressure to L5-S1 Standing lumbar extension x 10 with emphasis on bending and moving and not holding Patient has no LE pain following treatment and 0/10 back pain following treatment Patient performed with instruction, verbal cues, tactile cues of therapist: goal:increase tissue extensibility, promote proper posture, improve mobility                          PT Education - 08/04/20 0849    Education Details HEP    Person(s) Educated Patient    Methods Explanation    Comprehension Verbalized understanding            PT Short Term Goals - 07/14/20 1752      PT SHORT TERM GOAL #1   Title Patient will be independent in home exercise program to improve strength/mobility for better functional independence with ADLs.    Baseline 8/31: HEP  given    Time 4    Period Weeks    Status New    Target Date 08/11/20             PT Long Term Goals - 07/14/20 1752      PT LONG TERM GOAL #1   Title Patient will increase FOTO score to equal to or greater than  64%   to demonstrate statistically significant improvement in mobility and quality of life.    Baseline 8/31: 51%    Time 8    Period Weeks    Status New    Target Date 09/08/20      PT LONG TERM GOAL #2   Title Patient will report a worst pain of 3/10 on VAS in lumbar region to improve tolerance with ADLs and reduced symptoms with activities.    Baseline 8/31: 10/10    Time 8    Period Weeks    Status New    Target Date 09/08/20      PT LONG TERM GOAL #3   Title Patient will reduce modified Oswestry score to <20 as to demonstrate minimal disability with ADLs including improved sleeping tolerance, walking/sitting tolerance etc for better mobility with ADLs    Baseline 8/31: 40%    Time 8    Period Weeks    Status New    Target Date 09/08/20      PT LONG TERM GOAL #4   Title Patient will increase BLE gross strength to 4+/5 as to improve functional strength for independent gait, increased standing tolerance and increased ADL ability.    Baseline 8/31: see note    Time 8    Period Weeks    Status New    Target Date 09/08/20                 Plan - 08/04/20 0850    Clinical Impression Statement Patient is able to perform lumbar extension exercises with no pain today. She has mild tenderness with palpation to L5-S1. She performs core strengthening with TA contracture without pain behaviors. She will continue to benefit from skilled PT to improve mobility and strength.    Personal Factors and Comorbidities Age;Comorbidity 3+;Finances;Fitness;Past/Current Experience;Profession;Time since onset of injury/illness/exacerbation    Comorbidities anxiety, HTN, obesity, PVD, thyroid disease, diverticulitis, HLD, bursitis trochanteric    Examination-Activity  Limitations Caring for Others;Dressing;Squat;Stand;Transfers;Other;Bed Mobility;Locomotion Level;Lift;Stairs    Examination-Participation Restrictions Church;Cleaning;Community Activity;Driving;Meal Prep;Laundry;Shop;Yard Work;Other    Stability/Clinical Decision Making Evolving/Moderate complexity  Rehab Potential Fair    PT Frequency 2x / week    PT Duration 8 weeks    PT Treatment/Interventions ADLs/Self Care Home Management;Aquatic Therapy;Biofeedback;Cryotherapy;Electrical Stimulation;Iontophoresis 4mg /ml Dexamethasone;Moist Heat;Traction;Ultrasound;Contrast Bath;Therapeutic activities;Therapeutic exercise;Manual techniques;Patient/family education;Manual lymph drainage;Compression bandaging;Passive range of motion;Dry needling;Vasopneumatic Device;Taping;Splinting;Energy conservation;Visual/perceptual remediation/compensation;Joint Manipulations;Gait training;Stair training;Functional mobility training;Neuromuscular re-education;Balance training;Spinal Manipulations    PT Next Visit Plan STM low back, repeated trunk extension, LE lengthening    PT Home Exercise Plan see above    Consulted and Agree with Plan of Care Patient           Patient will benefit from skilled therapeutic intervention in order to improve the following deficits and impairments:  Decreased activity tolerance, Decreased endurance, Decreased mobility, Decreased range of motion, Decreased strength, Hypomobility, Increased edema, Impaired flexibility, Impaired perceived functional ability, Increased muscle spasms, Obesity, Postural dysfunction, Improper body mechanics, Pain, Abnormal gait, Decreased balance, Difficulty walking  Visit Diagnosis: Low back pain, unspecified back pain laterality, unspecified chronicity, unspecified whether sciatica present  Abnormal posture  Muscle weakness (generalized)  Acute pain of left shoulder  Stiffness of left shoulder, not elsewhere classified     Problem List Patient  Active Problem List   Diagnosis Date Noted   Endometrial hyperplasia without atypia, simple 07/21/2015   Diverticulosis 07/21/2015   Neuritis or radiculitis due to rupture of lumbar intervertebral disc 11/12/2014   Lumbar canal stenosis 11/12/2014   Bursitis, trochanteric 11/12/2014   Back ache 04/10/2014   Chronic anemia 04/10/2014   BP (high blood pressure) 04/10/2014   Blood glucose elevated 04/10/2014   HLD (hyperlipidemia) 04/10/2014   Adult hypothyroidism 04/10/2014   Adiposity 04/10/2014   Obstructive apnea 04/10/2014    Alanson Puls, PT DPT 08/04/2020, 9:21 AM  [place] [address] [place], [place], [address] Phone: [phone]   Fax:  [phone]  Name: Rebekah Perkins MRN: [id number] Date of Birth: 1955/12/25

## 2020-08-06 ENCOUNTER — Ambulatory Visit: Payer: 59 | Admitting: Physical Therapy

## 2020-08-06 ENCOUNTER — Other Ambulatory Visit: Payer: Self-pay

## 2020-08-06 ENCOUNTER — Encounter: Payer: Self-pay | Admitting: Physical Therapy

## 2020-08-06 DIAGNOSIS — R293 Abnormal posture: Secondary | ICD-10-CM

## 2020-08-06 DIAGNOSIS — M25612 Stiffness of left shoulder, not elsewhere classified: Secondary | ICD-10-CM | POA: Diagnosis not present

## 2020-08-06 DIAGNOSIS — M6281 Muscle weakness (generalized): Secondary | ICD-10-CM

## 2020-08-06 DIAGNOSIS — M545 Low back pain, unspecified: Secondary | ICD-10-CM

## 2020-08-06 DIAGNOSIS — M25512 Pain in left shoulder: Secondary | ICD-10-CM | POA: Diagnosis not present

## 2020-08-06 NOTE — Therapy (Signed)
[place] MAIN [facility] SERVICES [address] [place], [place], [address] Phone: [phone]   Fax:  [phone]  Physical Therapy Treatment  Patient Details  Name: Rebekah Perkins MRN: [id number] Date of Birth: 02-25-1956 Referring Provider (PT): Me[place], [place], Carlean Jews    Encounter Date: 08/06/2020   PT End of Session - 08/06/20 1042    Visit Number 7    Number of Visits 16    Date for PT Re-Evaluation 09/08/20    Authorization Type 1/10 eval 8/31    PT Start Time 1015    PT Stop Time 1040    PT Time Calculation (min) 25 min    Equipment Utilized During Treatment Gait belt    Activity Tolerance Patient limited by pain    Behavior During Therapy WFL for tasks assessed/performed           Past Medical History:  Diagnosis Date   Anxiety    Apnea    Diverticulitis    Environmental allergies    Hypertension    Menopausal state    Obesity    PMB (postmenopausal bleeding)    PVD (peripheral vascular disease) ([place])    Rosacea    Simple endometrial hyperplasia    Thyroid disease    Urinary urgency     Past Surgical History:  Procedure Laterality Date   CHOLECYSTECTOMY     DILATION AND CURETTAGE OF UTERUS     HYSTEROSCOPY      There were no vitals filed for this visit.   Subjective Assessment - 08/06/20 1040    Subjective Patient is not having mild discomfot today.    Pertinent History Patient is a pleasant 64 year old female who presents for DDD, back muscle spasm, and lumbar radiculitis. Patient has seen this therapist in the past for her shoulder. Patient has history of back pain however beginning in June 2021 she has had extreme exacerbation that has limited her quality of life. Patient is getting an MRI on Sept 14th, 2021 as one has not been completed since 2015. Pain is described as radiating down left and right LE's with left side being primary source of radiating pain. Has not had any episodes of bowel and  bladder but does feel weakness of legs. She also describes pain at the lateral hips interferes with her ability to lie on her sides. Sitting feels like she is being stabbed. Leaning forward is additionally painful. Laying down relieves pain when flat on back. 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    Limitations Sitting;Lifting;Walking;House hold activities;Other (comment)    How long can you sit comfortably? 5 minutes severe pain: feels sitting on sharp rocks    How long can you stand comfortably? not limited by back pain    How long can you walk comfortably? loosens with prolonged walking    Diagnostic tests awaiting MRI onthe 14th    Patient Stated Goals to reduce pain, be able to sit again    Currently in Pain? Yes    Pain Score 1     Pain Location Back    Pain Orientation Left    Pain Descriptors / Indicators Dull    Pain Type Chronic pain    Pain Onset More than a month ago    Aggravating Factors  sitting    Pain Relieving Factors laying on back    Effect of Pain on Daily Activities limits activitie  Manual therapy  STM to low back  PA glides x L1-L5, L5- S1grade 3 x 30 bouts Left LE in frog position and PA glides to L5-s1 x 30 and overpressure to L5-S1 Standing lumbar extension x 10 x 2 sets with emphasis on bending and moving and not holding Patient has no LE pain following treatment and 0/10 back pain following treatment Patient performed with instruction, verbal cues, tactile cues of therapist: goal:increase tissue extensibility, promote proper posture, improve mobility                           PT Education - 08/06/20 1042    Education Details HEP    Person(s) Educated Patient    Methods Explanation    Comprehension Verbalized understanding;Need further instruction;Returned demonstration;Verbal cues required;Tactile cues required            PT Short Term Goals - 07/14/20 1752      PT SHORT TERM GOAL  #1   Title Patient will be independent in home exercise program to improve strength/mobility for better functional independence with ADLs.    Baseline 8/31: HEP given    Time 4    Period Weeks    Status New    Target Date 08/11/20             PT Long Term Goals - 07/14/20 1752      PT LONG TERM GOAL #1   Title Patient will increase FOTO score to equal to or greater than  64%   to demonstrate statistically significant improvement in mobility and quality of life.    Baseline 8/31: 51%    Time 8    Period Weeks    Status New    Target Date 09/08/20      PT LONG TERM GOAL #2   Title Patient will report a worst pain of 3/10 on VAS in lumbar region to improve tolerance with ADLs and reduced symptoms with activities.    Baseline 8/31: 10/10    Time 8    Period Weeks    Status New    Target Date 09/08/20      PT LONG TERM GOAL #3   Title Patient will reduce modified Oswestry score to <20 as to demonstrate minimal disability with ADLs including improved sleeping tolerance, walking/sitting tolerance etc for better mobility with ADLs    Baseline 8/31: 40%    Time 8    Period Weeks    Status New    Target Date 09/08/20      PT LONG TERM GOAL #4   Title Patient will increase BLE gross strength to 4+/5 as to improve functional strength for independent gait, increased standing tolerance and increased ADL ability.    Baseline 8/31: see note    Time 8    Period Weeks    Status New    Target Date 09/08/20                 Plan - 08/06/20 1043    Clinical Impression Statement Patient is able to perform lumbar extension exercises with no pain today. She has mild tenderness with palpation to L5-S1. She is instructed in how to apply pressure over L5-S1 and perform repeated lumbar extension for relief of symptoms for HEP. She has no pain or discomfort at end of session and will continue to benefit from skilled PT to improve mobility and decrease pain.    Personal Factors and  Comorbidities Age;Comorbidity 3+;Finances;Fitness;Past/Current  Experience;Profession;Time since onset of injury/illness/exacerbation    Comorbidities anxiety, HTN, obesity, PVD, thyroid disease, diverticulitis, HLD, bursitis trochanteric    Examination-Activity Limitations Caring for Others;Dressing;Squat;Stand;Transfers;Other;Bed Mobility;Locomotion Level;Lift;Stairs    Examination-Participation Restrictions Church;Cleaning;Community Activity;Driving;Meal Prep;Laundry;Shop;Yard Work;Other    Stability/Clinical Decision Making Evolving/Moderate complexity    Rehab Potential Fair    PT Frequency 2x / week    PT Duration 8 weeks    PT Treatment/Interventions ADLs/Self Care Home Management;Aquatic Therapy;Biofeedback;Cryotherapy;Electrical Stimulation;Iontophoresis 4mg /ml Dexamethasone;Moist Heat;Traction;Ultrasound;Contrast Bath;Therapeutic activities;Therapeutic exercise;Manual techniques;Patient/family education;Manual lymph drainage;Compression bandaging;Passive range of motion;Dry needling;Vasopneumatic Device;Taping;Splinting;Energy conservation;Visual/perceptual remediation/compensation;Joint Manipulations;Gait training;Stair training;Functional mobility training;Neuromuscular re-education;Balance training;Spinal Manipulations    PT Next Visit Plan STM low back, repeated trunk extension, LE lengthening    PT Home Exercise Plan see above    Consulted and Agree with Plan of Care Patient           Patient will benefit from skilled therapeutic intervention in order to improve the following deficits and impairments:  Decreased activity tolerance, Decreased endurance, Decreased mobility, Decreased range of motion, Decreased strength, Hypomobility, Increased edema, Impaired flexibility, Impaired perceived functional ability, Increased muscle spasms, Obesity, Postural dysfunction, Improper body mechanics, Pain, Abnormal gait, Decreased balance, Difficulty walking  Visit Diagnosis: Low back pain,  unspecified back pain laterality, unspecified chronicity, unspecified whether sciatica present  Abnormal posture  Muscle weakness (generalized)     Problem List Patient Active Problem List   Diagnosis Date Noted   Endometrial hyperplasia without atypia, simple 07/21/2015   Diverticulosis 07/21/2015   Neuritis or radiculitis due to rupture of lumbar intervertebral disc 11/12/2014   Lumbar canal stenosis 11/12/2014   Bursitis, trochanteric 11/12/2014   Back ache 04/10/2014   Chronic anemia 04/10/2014   BP (high blood pressure) 04/10/2014   Blood glucose elevated 04/10/2014   HLD (hyperlipidemia) 04/10/2014   Adult hypothyroidism 04/10/2014   Adiposity 04/10/2014   Obstructive apnea 04/10/2014    Alanson Puls, PT DPT 08/06/2020, 10:44 AM  [place], [place], [address] Phone: [phone]   Fax:  [phone]  Name: Rebekah Perkins MRN: [id number] Date of Birth: 1956-09-15

## 2020-08-07 DIAGNOSIS — M62830 Muscle spasm of back: Secondary | ICD-10-CM | POA: Diagnosis not present

## 2020-08-07 DIAGNOSIS — M48062 Spinal stenosis, lumbar region with neurogenic claudication: Secondary | ICD-10-CM | POA: Diagnosis not present

## 2020-08-07 DIAGNOSIS — M5136 Other intervertebral disc degeneration, lumbar region: Secondary | ICD-10-CM | POA: Diagnosis not present

## 2020-08-07 DIAGNOSIS — M5416 Radiculopathy, lumbar region: Secondary | ICD-10-CM | POA: Diagnosis not present

## 2020-08-11 ENCOUNTER — Ambulatory Visit: Payer: 59 | Admitting: Physical Therapy

## 2020-08-13 ENCOUNTER — Ambulatory Visit: Payer: 59 | Admitting: Physical Therapy

## 2020-08-17 ENCOUNTER — Ambulatory Visit: Payer: 59 | Attending: Sports Medicine | Admitting: Physical Therapy

## 2020-08-17 ENCOUNTER — Other Ambulatory Visit: Payer: Self-pay

## 2020-08-17 ENCOUNTER — Encounter: Payer: Self-pay | Admitting: Physical Therapy

## 2020-08-17 DIAGNOSIS — R293 Abnormal posture: Secondary | ICD-10-CM | POA: Insufficient documentation

## 2020-08-17 DIAGNOSIS — M545 Low back pain, unspecified: Secondary | ICD-10-CM | POA: Insufficient documentation

## 2020-08-17 DIAGNOSIS — M25612 Stiffness of left shoulder, not elsewhere classified: Secondary | ICD-10-CM | POA: Insufficient documentation

## 2020-08-17 DIAGNOSIS — M25512 Pain in left shoulder: Secondary | ICD-10-CM | POA: Insufficient documentation

## 2020-08-17 DIAGNOSIS — M6281 Muscle weakness (generalized): Secondary | ICD-10-CM | POA: Insufficient documentation

## 2020-08-17 NOTE — Therapy (Signed)
[place] MAIN [facility] SERVICES [address] [place], [place], [address] Phone: [phone]   Fax:  [phone]  Physical Therapy Treatment  Patient Details  Name: Rebekah Perkins MRN: [id number] Date of Birth: August 30, 1956 Referring Provider (PT): Meeler, [place], Carlean Jews    Encounter Date: 08/17/2020   PT End of Session - 08/17/20 0845    Visit Number 8    Number of Visits 16    Date for PT Re-Evaluation 09/08/20    Authorization Type 1/10 eval 8/31    PT Start Time 0800    PT Stop Time 0840    PT Time Calculation (min) 40 min    Equipment Utilized During Treatment Gait belt    Activity Tolerance Patient limited by pain    Behavior During Therapy WFL for tasks assessed/performed           Past Medical History:  Diagnosis Date  . Anxiety   . Apnea   . Diverticulitis   . Environmental allergies   . Hypertension   . Menopausal state   . Obesity   . PMB (postmenopausal bleeding)   . PVD (peripheral vascular disease) ([place])   . Rosacea   . Simple endometrial hyperplasia   . Thyroid disease   . Urinary urgency     Past Surgical History:  Procedure Laterality Date  . CHOLECYSTECTOMY    . DILATION AND CURETTAGE OF UTERUS    . HYSTEROSCOPY      There were no vitals filed for this visit.   Subjective Assessment - 08/17/20 0800    Subjective Patient is not having any pain but she did have a uncomfortable day on sunday.    Pertinent History Patient is a pleasant 64 year old female who presents for DDD, back muscle spasm, and lumbar radiculitis. Patient has seen this therapist in the past for her shoulder. Patient has history of back pain however beginning in June 2021 she has had extreme exacerbation that has limited her quality of life. Patient is getting an MRI on Sept 14th, 2021 as one has not been completed since 2015. Pain is described as radiating down left and right LE's with left side being primary source of radiating pain. Has not  had any episodes of bowel and bladder but does feel weakness of legs. She also describes pain at the lateral hips interferes with her ability to lie on her sides. Sitting feels like she is being stabbed. Leaning forward is additionally painful. Laying down relieves pain when flat on back. 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    Limitations Sitting;Lifting;Walking;House hold activities;Other (comment)    How long can you sit comfortably? 5 minutes severe pain: feels sitting on sharp rocks    How long can you stand comfortably? not limited by back pain    How long can you walk comfortably? loosens with prolonged walking    Diagnostic tests awaiting MRI onthe 14th    Patient Stated Goals to reduce pain, be able to sit again    Pain Score 0-No pain    Pain Onset More than a month ago           Treatment:  Hooklying abd/Er x 20 with BTB x 20 x 2  hooklying marching  X 20 with TA hooklying trunk rotation with TA  X 20     Manual therapy STM to low back  PA glides x L1-L5, L5- S1grade 3 x 30 bouts Left LE in  frog position and PA glides to L5-s1 x 30 and overpressure to L5-S1 Standing lumbar extension x 10 x 2 sets with emphasis on bending and moving and not holding Patient has no LE pain following treatment and 0/10 back pain following treatment Patient performed with instruction, verbal cues, tactile cues of therapist: goal:increase tissue extensibility, promote proper posture, improve mobility                         PT Education - 08/17/20 0843    Education Details HEP    Person(s) Educated Patient    Methods Explanation    Comprehension Verbalized understanding;Need further instruction;Returned demonstration            PT Short Term Goals - 07/14/20 1752      PT SHORT TERM GOAL #1   Title Patient will be independent in home exercise program to improve strength/mobility for better functional independence with ADLs.     Baseline 8/31: HEP given    Time 4    Period Weeks    Status New    Target Date 08/11/20             PT Long Term Goals - 07/14/20 1752      PT LONG TERM GOAL #1   Title Patient will increase FOTO score to equal to or greater than  64%   to demonstrate statistically significant improvement in mobility and quality of life.    Baseline 8/31: 51%    Time 8    Period Weeks    Status New    Target Date 09/08/20      PT LONG TERM GOAL #2   Title Patient will report a worst pain of 3/10 on VAS in lumbar region to improve tolerance with ADLs and reduced symptoms with activities.    Baseline 8/31: 10/10    Time 8    Period Weeks    Status New    Target Date 09/08/20      PT LONG TERM GOAL #3   Title Patient will reduce modified Oswestry score to <20 as to demonstrate minimal disability with ADLs including improved sleeping tolerance, walking/sitting tolerance etc for better mobility with ADLs    Baseline 8/31: 40%    Time 8    Period Weeks    Status New    Target Date 09/08/20      PT LONG TERM GOAL #4   Title Patient will increase BLE gross strength to 4+/5 as to improve functional strength for independent gait, increased standing tolerance and increased ADL ability.    Baseline 8/31: see note    Time 8    Period Weeks    Status New    Target Date 09/08/20                 Plan - 08/17/20 0846    Clinical Impression Statement Patient is able to progress low back strengthening and flexibility with no reports of pain in low back. She is limited with right knee pain . She will continue to benefit from skilled PT to improve flexibility and core strength.     Personal Factors and Comorbidities Age;Comorbidity 3+;Finances;Fitness;Past/Current Experience;Profession;Time since onset of injury/illness/exacerbation    Comorbidities anxiety, HTN, obesity, PVD, thyroid disease, diverticulitis, HLD, bursitis trochanteric    Examination-Activity Limitations Caring for  Others;Dressing;Squat;Stand;Transfers;Other;Bed Mobility;Locomotion Level;Lift;Stairs    Examination-Participation Restrictions Church;Cleaning;Community Activity;Driving;Meal Prep;Laundry;Shop;Yard Work;Other    Stability/Clinical Decision Making Evolving/Moderate complexity    Rehab Potential  Fair    PT Frequency 2x / week    PT Duration 8 weeks    PT Treatment/Interventions ADLs/Self Care Home Management;Aquatic Therapy;Biofeedback;Cryotherapy;Electrical Stimulation;Iontophoresis 4mg /ml Dexamethasone;Moist Heat;Traction;Ultrasound;Contrast Bath;Therapeutic activities;Therapeutic exercise;Manual techniques;Patient/family education;Manual lymph drainage;Compression bandaging;Passive range of motion;Dry needling;Vasopneumatic Device;Taping;Splinting;Energy conservation;Visual/perceptual remediation/compensation;Joint Manipulations;Gait training;Stair training;Functional mobility training;Neuromuscular re-education;Balance training;Spinal Manipulations    PT Next Visit Plan STM low back, repeated trunk extension, LE lengthening    PT Home Exercise Plan see above    Consulted and Agree with Plan of Care Patient           Patient will benefit from skilled therapeutic intervention in order to improve the following deficits and impairments:  Decreased activity tolerance, Decreased endurance, Decreased mobility, Decreased range of motion, Decreased strength, Hypomobility, Increased edema, Impaired flexibility, Impaired perceived functional ability, Increased muscle spasms, Obesity, Postural dysfunction, Improper body mechanics, Pain, Abnormal gait, Decreased balance, Difficulty walking  Visit Diagnosis: Abnormal posture  Low back pain, unspecified back pain laterality, unspecified chronicity, unspecified whether sciatica present  Muscle weakness (generalized)  Acute pain of left shoulder  Stiffness of left shoulder, not elsewhere classified     Problem List Patient Active Problem List    Diagnosis Date Noted  . Endometrial hyperplasia without atypia, simple 07/21/2015  . Diverticulosis 07/21/2015  . Neuritis or radiculitis due to rupture of lumbar intervertebral disc 11/12/2014  . Lumbar canal stenosis 11/12/2014  . Bursitis, trochanteric 11/12/2014  . Back ache 04/10/2014  . Chronic anemia 04/10/2014  . BP (high blood pressure) 04/10/2014  . Blood glucose elevated 04/10/2014  . HLD (hyperlipidemia) 04/10/2014  . Adult hypothyroidism 04/10/2014  . Adiposity 04/10/2014  . Obstructive apnea 04/10/2014    Alanson Puls, [place] DPT 08/17/2020, 8:52 AM  [place] MAIN [facility] SERVICES [address] [place], [place], [address] Phone: [phone]   Fax:  [phone]  Name: Rebekah Perkins MRN: [id number] Date of Birth: August 30, 1956

## 2020-08-20 ENCOUNTER — Other Ambulatory Visit: Payer: Self-pay

## 2020-08-20 ENCOUNTER — Ambulatory Visit: Payer: 59 | Admitting: Physical Therapy

## 2020-08-20 DIAGNOSIS — M6281 Muscle weakness (generalized): Secondary | ICD-10-CM

## 2020-08-20 DIAGNOSIS — M545 Low back pain, unspecified: Secondary | ICD-10-CM

## 2020-08-20 DIAGNOSIS — M25512 Pain in left shoulder: Secondary | ICD-10-CM | POA: Diagnosis not present

## 2020-08-20 DIAGNOSIS — R293 Abnormal posture: Secondary | ICD-10-CM | POA: Diagnosis not present

## 2020-08-20 DIAGNOSIS — M25612 Stiffness of left shoulder, not elsewhere classified: Secondary | ICD-10-CM | POA: Diagnosis not present

## 2020-08-20 NOTE — Therapy (Signed)
[place] MAIN [facility] SERVICES [address] [place], [place], [address] Phone: [phone]   Fax:  [phone]  Physical Therapy Treatment  Patient Details  Name: Rebekah Perkins MRN: [id number] Date of Birth: February 12, 1956 Referring Provider (PT): Meeler, [place], Carlean Jews    Encounter Date: 08/20/2020   PT End of Session - 08/20/20 0952    Visit Number 9    Number of Visits 16    Date for PT Re-Evaluation 09/08/20    Authorization Type 1/10 eval 8/31    PT Start Time 0930    PT Stop Time 1015    PT Time Calculation (min) 45 min    Equipment Utilized During Treatment Gait belt    Activity Tolerance Patient limited by pain    Behavior During Therapy [facility] for tasks assessed/performed           Past Medical History:  Diagnosis Date  . Anxiety   . Apnea   . Diverticulitis   . Environmental allergies   . Hypertension   . Menopausal state   . Obesity   . PMB (postmenopausal bleeding)   . PVD (peripheral vascular disease) ([place])   . Rosacea   . Simple endometrial hyperplasia   . Thyroid disease   . Urinary urgency     Past Surgical History:  Procedure Laterality Date  . CHOLECYSTECTOMY    . DILATION AND CURETTAGE OF UTERUS    . HYSTEROSCOPY      There were no vitals filed for this visit.   Subjective Assessment - 08/20/20 0950    Subjective Patient has some discomfort in her buttocks that goes away after manual therapy.    Currently in Pain? Yes    Pain Score 1     Pain Location Buttocks    Pain Orientation Left    Pain Descriptors / Indicators Tingling            Manual therapy STM to low back  PA glides x L1-L5, L5- S1grade 3 x 30 bouts Left LE in frog position and PA glides to L5-s1 x 30 and overpressure to L5-S1 Standing lumbar extension x 10x 2 setswith emphasis on bending and moving and not holding Patient has no LE pain following treatment and 0/10 back pain following treatment Patient performed with  instruction, verbal cues, tactile cues of therapist: goal:increase tissue extensibility, promote proper posture, improve mobility                           PT Education - 08/20/20 0952    Education Details HEP    Person(s) Educated Patient    Methods Explanation    Comprehension Verbalized understanding            PT Short Term Goals - 07/14/20 1752      PT SHORT TERM GOAL #1   Title Patient will be independent in home exercise program to improve strength/mobility for better functional independence with ADLs.    Baseline 8/31: HEP given    Time 4    Period Weeks    Status New    Target Date 08/11/20             PT Long Term Goals - 07/14/20 1752      PT LONG TERM GOAL #1   Title Patient will increase FOTO score to equal to or greater than  64%   to demonstrate statistically significant improvement in mobility and quality of life.  Baseline 8/31: 51%    Time 8    Period Weeks    Status New    Target Date 09/08/20      PT LONG TERM GOAL #2   Title Patient will report a worst pain of 3/10 on VAS in lumbar region to improve tolerance with ADLs and reduced symptoms with activities.    Baseline 8/31: 10/10    Time 8    Period Weeks    Status New    Target Date 09/08/20      PT LONG TERM GOAL #3   Title Patient will reduce modified Oswestry score to <20 as to demonstrate minimal disability with ADLs including improved sleeping tolerance, walking/sitting tolerance etc for better mobility with ADLs    Baseline 8/31: 40%    Time 8    Period Weeks    Status New    Target Date 09/08/20      PT LONG TERM GOAL #4   Title Patient will increase BLE gross strength to 4+/5 as to improve functional strength for independent gait, increased standing tolerance and increased ADL ability.    Baseline 8/31: see note    Time 8    Period Weeks    Status New    Target Date 09/08/20                 Plan - 08/20/20 0953    Clinical Impression  Statement Patient is able to progress low back strengthening and flexibility with no reports of pain in low back. She is limited with right knee pain . She will continue to benefit from skilled PT to improve flexibility and core strength    Personal Factors and Comorbidities Age;Comorbidity 3+;Finances;Fitness;Past/Current Experience;Profession;Time since onset of injury/illness/exacerbation    Comorbidities anxiety, HTN, obesity, PVD, thyroid disease, diverticulitis, HLD, bursitis trochanteric    Examination-Activity Limitations Caring for Others;Dressing;Squat;Stand;Transfers;Other;Bed Mobility;Locomotion Level;Lift;Stairs    Examination-Participation Restrictions Church;Cleaning;Community Activity;Driving;Meal Prep;Laundry;Shop;Yard Work;Other    Stability/Clinical Decision Making Evolving/Moderate complexity    Rehab Potential Fair    PT Frequency 2x / week    PT Duration 8 weeks    PT Treatment/Interventions ADLs/Self Care Home Management;Aquatic Therapy;Biofeedback;Cryotherapy;Electrical Stimulation;Iontophoresis 4mg /ml Dexamethasone;Moist Heat;Traction;Ultrasound;Contrast Bath;Therapeutic activities;Therapeutic exercise;Manual techniques;Patient/family education;Manual lymph drainage;Compression bandaging;Passive range of motion;Dry needling;Vasopneumatic Device;Taping;Splinting;Energy conservation;Visual/perceptual remediation/compensation;Joint Manipulations;Gait training;Stair training;Functional mobility training;Neuromuscular re-education;Balance training;Spinal Manipulations    PT Next Visit Plan STM low back, repeated trunk extension, LE lengthening    PT Home Exercise Plan see above    Consulted and Agree with Plan of Care Patient           Patient will benefit from skilled therapeutic intervention in order to improve the following deficits and impairments:  Decreased activity tolerance, Decreased endurance, Decreased mobility, Decreased range of motion, Decreased strength,  Hypomobility, Increased edema, Impaired flexibility, Impaired perceived functional ability, Increased muscle spasms, Obesity, Postural dysfunction, Improper body mechanics, Pain, Abnormal gait, Decreased balance, Difficulty walking  Visit Diagnosis: Low back pain, unspecified back pain laterality, unspecified chronicity, unspecified whether sciatica present  Muscle weakness (generalized)     Problem List Patient Active Problem List   Diagnosis Date Noted  . Endometrial hyperplasia without atypia, simple 07/21/2015  . Diverticulosis 07/21/2015  . Neuritis or radiculitis due to rupture of lumbar intervertebral disc 11/12/2014  . Lumbar canal stenosis 11/12/2014  . Bursitis, trochanteric 11/12/2014  . Back ache 04/10/2014  . Chronic anemia 04/10/2014  . BP (high blood pressure) 04/10/2014  . Blood glucose elevated 04/10/2014  . HLD (hyperlipidemia) 04/10/2014  .  Adult hypothyroidism 04/10/2014  . Adiposity 04/10/2014  . Obstructive apnea 04/10/2014    Alanson Puls, [place] DPT 08/20/2020, 9:54 AM  [place] MAIN [facility] SERVICES [address] [place], [place], [address] Phone: [phone]   Fax:  [phone]  Name: SHRESTA RISDEN MRN: [id number] Date of Birth: 12/31/1955

## 2020-08-25 ENCOUNTER — Ambulatory Visit: Payer: 59 | Admitting: Physical Therapy

## 2020-08-25 ENCOUNTER — Encounter: Payer: Self-pay | Admitting: Physical Therapy

## 2020-08-25 ENCOUNTER — Other Ambulatory Visit: Payer: Self-pay

## 2020-08-25 DIAGNOSIS — M6281 Muscle weakness (generalized): Secondary | ICD-10-CM

## 2020-08-25 DIAGNOSIS — R293 Abnormal posture: Secondary | ICD-10-CM

## 2020-08-25 DIAGNOSIS — M545 Low back pain, unspecified: Secondary | ICD-10-CM | POA: Diagnosis not present

## 2020-08-25 DIAGNOSIS — M25512 Pain in left shoulder: Secondary | ICD-10-CM | POA: Diagnosis not present

## 2020-08-25 DIAGNOSIS — M25612 Stiffness of left shoulder, not elsewhere classified: Secondary | ICD-10-CM | POA: Diagnosis not present

## 2020-08-25 NOTE — Therapy (Addendum)
[place] MAIN [facility] SERVICES [address] [place], [place], [address] Phone: [phone]   Fax:  [phone]  Physical Therapy Treatment Physical Therapy Progress Note   Dates of reporting period  07/14/20 to 08/25/20  Patient Details  Name: Rebekah Perkins MRN: [id number] Date of Birth: Apr 10, 1956 Referring Provider (PT): Meeler, [place], Carlean Jews    Encounter Date: 08/25/2020   PT End of Session - 08/25/20 0945    Visit Number 10    Number of Visits 16    Date for PT Re-Evaluation 09/08/20    Authorization Type 1/10 eval 8/31    PT Start Time 0930    PT Stop Time 1015    PT Time Calculation (min) 45 min    Equipment Utilized During Treatment Gait belt    Activity Tolerance Patient limited by pain    Behavior During Therapy [facility] for tasks assessed/performed           Past Medical History:  Diagnosis Date  . Anxiety   . Apnea   . Diverticulitis   . Environmental allergies   . Hypertension   . Menopausal state   . Obesity   . PMB (postmenopausal bleeding)   . PVD (peripheral vascular disease) ([place])   . Rosacea   . Simple endometrial hyperplasia   . Thyroid disease   . Urinary urgency     Past Surgical History:  Procedure Laterality Date  . CHOLECYSTECTOMY    . DILATION AND CURETTAGE OF UTERUS    . HYSTEROSCOPY      There were no vitals filed for this visit.   Subjective Assessment - 08/25/20 0943    Subjective Patient has minimal discomfort left SI joint  that goes away after manual therapy.    Pertinent History Patient is a pleasant 64 year old female who presents for DDD, back muscle spasm, and lumbar radiculitis. Patient has seen this therapist in the past for her shoulder. Patient has history of back pain however beginning in June 2021 she has had extreme exacerbation that has limited her quality of life. Patient is getting an MRI on Sept 14th, 2021 as one has not been completed since 2015. Pain is described as radiating  down left and right LE's with left side being primary source of radiating pain. Has not had any episodes of bowel and bladder but does feel weakness of legs. She also describes pain at the lateral hips interferes with her ability to lie on her sides. Sitting feels like she is being stabbed. Leaning forward is additionally painful. Laying down relieves pain when flat on back. 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    Limitations Sitting;Lifting;Walking;House hold activities;Other (comment)    How long can you sit comfortably? 5 minutes severe pain: feels sitting on sharp rocks    How long can you stand comfortably? not limited by back pain    How long can you walk comfortably? loosens with prolonged walking    Diagnostic tests awaiting MRI onthe 14th    Patient Stated Goals to reduce pain, be able to sit again    Currently in Pain? Yes    Pain Score 1     Pain Location Back    Pain Orientation Left    Pain Onset More than a month ago           Treatment: Outcome measures performed and goals reviewed  Manual therapy STM to low back  PA glides x L1-L5, L5-  S1grade 3 x 30 bouts Left LE in frog position and PA glides to L5-s1 x 30 and overpressure to L5-S1 Standing lumbar extension x 10x 2 setswith emphasis on bending and moving and not holding Patient has no LE pain following treatment and 0/10 back pain following treatment Patient performed with instruction, verbal cues, tactile cues of therapist: goal:increase tissue extensibility, promote proper posture, improve mobility                          PT Education - 08/25/20 0944    Education Details HEP    Person(s) Educated Patient    Methods Explanation    Comprehension Verbalized understanding;Need further instruction;Tactile cues required            PT Short Term Goals - 08/25/20 0946      PT SHORT TERM GOAL #1   Title Patient will be independent in home exercise program  to improve strength/mobility for better functional independence with ADLs.    Baseline 8/31: HEP given    Time 4    Period Weeks    Status Achieved    Target Date 08/11/20             PT Long Term Goals - 08/25/20 0946      PT LONG TERM GOAL #1   Title Patient will increase FOTO score to equal to or greater than  64%   to demonstrate statistically significant improvement in mobility and quality of life.    Baseline 8/31: 51%, 08/25/20=51    Time 8    Period Weeks    Status Partially Met      PT LONG TERM GOAL #2   Title Patient will report a worst pain of 3/10 on VAS in lumbar region to improve tolerance with ADLs and reduced symptoms with activities.    Baseline 8/31: 10/10, 08/25/20= 1/10    Time 8    Period Weeks    Status Partially Met      PT LONG TERM GOAL #3   Title Patient will reduce modified Oswestry score to <20 as to demonstrate minimal disability with ADLs including improved sleeping tolerance, walking/sitting tolerance etc for better mobility with ADLs    Baseline 8/31: 40%, 08/25/20=34%    Time 8    Period Weeks    Status Partially Met    Target Date 09/08/20      PT LONG TERM GOAL #4   Title Patient will increase BLE gross strength to 4+/5 as to improve functional strength for independent gait, increased standing tolerance and increased ADL ability.    Baseline 8/31: see note, BLE 3/5 hip and knee    Time 8    Status Partially Met    Target Date 09/08/20      PT LONG TERM GOAL #5   Title Patient will increase strength of UE's to 4/5 with minimal pain to improve ability to perform work and home tasks and return to PLOF.    Baseline 7/20: see note 8/25: 4+/5, 08/25/20= BLE 3/5 BLE    Time 8    Period Weeks    Status Partially Met                 Plan - 08/25/20 0945    Clinical Impression Statement Patient's condition has the potential to improve in response to therapy. Maximum improvement is yet to be obtained. The anticipated improvement is  attainable and reasonable in a generally predictable time.  Patient reports that  functional mobility depends on how active she is during the day. Her outcome measures improved and  goals were reviewed and progressed. Patient will continue to benefit from skilled PT to improve mobility.Patient is able to progress low back strengthening and flexibility with no reports of pain in low back. She is limited with right knee pain . She will continue to benefit from skilled PT to improve flexibility and core strength    Personal Factors and Comorbidities Age;Comorbidity 3+;Finances;Fitness;Past/Current Experience;Profession;Time since onset of injury/illness/exacerbation    Comorbidities anxiety, HTN, obesity, PVD, thyroid disease, diverticulitis, HLD, bursitis trochanteric    Examination-Activity Limitations Caring for Others;Dressing;Squat;Stand;Transfers;Other;Bed Mobility;Locomotion Level;Lift;Stairs    Examination-Participation Restrictions Church;Cleaning;Community Activity;Driving;Meal Prep;Laundry;Shop;Yard Work;Other    Stability/Clinical Decision Making Evolving/Moderate complexity    Rehab Potential Fair    PT Frequency 2x / week    PT Duration 8 weeks    PT Treatment/Interventions ADLs/Self Care Home Management;Aquatic Therapy;Biofeedback;Cryotherapy;Electrical Stimulation;Iontophoresis 73m/ml Dexamethasone;Moist Heat;Traction;Ultrasound;Contrast Bath;Therapeutic activities;Therapeutic exercise;Manual techniques;Patient/family education;Manual lymph drainage;Compression bandaging;Passive range of motion;Dry needling;Vasopneumatic Device;Taping;Splinting;Energy conservation;Visual/perceptual remediation/compensation;Joint Manipulations;Gait training;Stair training;Functional mobility training;Neuromuscular re-education;Balance training;Spinal Manipulations    PT Next Visit Plan STM low back, repeated trunk extension, LE lengthening    PT Home Exercise Plan see above    Consulted and Agree with Plan of  Care Patient           Patient will benefit from skilled therapeutic intervention in order to improve the following deficits and impairments:  Decreased activity tolerance, Decreased endurance, Decreased mobility, Decreased range of motion, Decreased strength, Hypomobility, Increased edema, Impaired flexibility, Impaired perceived functional ability, Increased muscle spasms, Obesity, Postural dysfunction, Improper body mechanics, Pain, Abnormal gait, Decreased balance, Difficulty walking  Visit Diagnosis: Low back pain, unspecified back pain laterality, unspecified chronicity, unspecified whether sciatica present  Muscle weakness (generalized)  Abnormal posture     Problem List Patient Active Problem List   Diagnosis Date Noted  . Endometrial hyperplasia without atypia, simple 07/21/2015  . Diverticulosis 07/21/2015  . Neuritis or radiculitis due to rupture of lumbar intervertebral disc 11/12/2014  . Lumbar canal stenosis 11/12/2014  . Bursitis, trochanteric 11/12/2014  . Back ache 04/10/2014  . Chronic anemia 04/10/2014  . BP (high blood pressure) 04/10/2014  . Blood glucose elevated 04/10/2014  . HLD (hyperlipidemia) 04/10/2014  . Adult hypothyroidism 04/10/2014  . Adiposity 04/10/2014  . Obstructive apnea 04/10/2014    MAlanson Puls PVirginiaDPT 08/25/2020, 11:02 AM  [place] [facility] [address] [place] [address]Phone: [phone]  Fax:  [phone] Name: DMARTIZA SPETHMRN: [id number] of Birth: [birth year]

## 2020-08-26 DIAGNOSIS — M5136 Other intervertebral disc degeneration, lumbar region: Secondary | ICD-10-CM | POA: Diagnosis not present

## 2020-08-26 DIAGNOSIS — M5416 Radiculopathy, lumbar region: Secondary | ICD-10-CM | POA: Diagnosis not present

## 2020-08-26 DIAGNOSIS — M48062 Spinal stenosis, lumbar region with neurogenic claudication: Secondary | ICD-10-CM | POA: Diagnosis not present

## 2020-08-27 ENCOUNTER — Ambulatory Visit: Payer: 59 | Admitting: Physical Therapy

## 2020-08-31 ENCOUNTER — Encounter: Payer: Self-pay | Admitting: Physical Therapy

## 2020-08-31 ENCOUNTER — Other Ambulatory Visit: Payer: Self-pay

## 2020-08-31 ENCOUNTER — Ambulatory Visit: Payer: 59 | Admitting: Physical Therapy

## 2020-08-31 DIAGNOSIS — R293 Abnormal posture: Secondary | ICD-10-CM

## 2020-08-31 DIAGNOSIS — M25612 Stiffness of left shoulder, not elsewhere classified: Secondary | ICD-10-CM | POA: Diagnosis not present

## 2020-08-31 DIAGNOSIS — M545 Low back pain, unspecified: Secondary | ICD-10-CM

## 2020-08-31 DIAGNOSIS — M25512 Pain in left shoulder: Secondary | ICD-10-CM | POA: Diagnosis not present

## 2020-08-31 DIAGNOSIS — M6281 Muscle weakness (generalized): Secondary | ICD-10-CM | POA: Diagnosis not present

## 2020-08-31 NOTE — Therapy (Signed)
[place] MAIN [facility] SERVICES [address] [place], [place], [address] Phone: [phone]   Fax:  [phone]  Physical Therapy Treatment  Patient Details  Name: Rebekah Perkins MRN: [id number] Date of Birth: [birth year] Referring Provider (PT): Meeler, [place], Carlean Jews    Encounter Date: 08/31/2020   PT End of Session - 08/31/20 0859    Visit Number 11    Number of Visits 16    Date for PT Re-Evaluation 09/08/20    Authorization Type 1/10 eval 8/31    PT Start Time 0850    PT Stop Time 0930    PT Time Calculation (min) 40 min    Equipment Utilized During Treatment Gait belt    Activity Tolerance Patient limited by pain    Behavior During Therapy WFL for tasks assessed/performed           Past Medical History:  Diagnosis Date   Anxiety    Apnea    Diverticulitis    Environmental allergies    Hypertension    Menopausal state    Obesity    PMB (postmenopausal bleeding)    PVD (peripheral vascular disease) ([place])    Rosacea    Simple endometrial hyperplasia    Thyroid disease    Urinary urgency     Past Surgical History:  Procedure Laterality Date   CHOLECYSTECTOMY     DILATION AND CURETTAGE OF UTERUS     HYSTEROSCOPY      There were no vitals filed for this visit.   Subjective Assessment - 08/31/20 0855    Subjective Patient had shots on wednesday and she has had numbness following it. She is finally today able to walk without her legs feeling like they would give away. he legs felt weak.    Currently in Pain? Yes    Pain Score 1     Pain Location Leg    Pain Orientation Left    Pain Descriptors / Indicators Burning           Treatment: Nu-step x 5 mins with ice   Treatment: hooklying hip ER/abd x 20 x 2  hooklying marching x 20 x 2  Trunk rotation left and right x 20 x 2  hooklying clam x 20 x 2  Patient has no LE pain following treatment and 0/10 back pain following treatment Patient performed  with instruction, verbal cues, tactile cues of therapist: goal:increase tissue extensibility, promote proper posture, improve mobility                          PT Education - 08/31/20 0859    Education Details HEP    Person(s) Educated Patient    Methods Explanation    Comprehension Verbalized understanding            PT Short Term Goals - 08/25/20 0946      PT SHORT TERM GOAL #1   Title Patient will be independent in home exercise program to improve strength/mobility for better functional independence with ADLs.    Baseline 8/31: HEP given    Time 4    Period Weeks    Status Achieved    Target Date 08/11/20             PT Long Term Goals - 08/25/20 0946      PT LONG TERM GOAL #1   Title Patient will increase FOTO score to equal to or greater than  64%   to  demonstrate statistically significant improvement in mobility and quality of life.    Baseline 8/31: 51%, 08/25/20=51    Time 8    Period Weeks    Status Partially Met      PT LONG TERM GOAL #2   Title Patient will report a worst pain of 3/10 on VAS in lumbar region to improve tolerance with ADLs and reduced symptoms with activities.    Baseline 8/31: 10/10, 08/25/20= 1/10    Time 8    Period Weeks    Status Partially Met      PT LONG TERM GOAL #3   Title Patient will reduce modified Oswestry score to <20 as to demonstrate minimal disability with ADLs including improved sleeping tolerance, walking/sitting tolerance etc for better mobility with ADLs    Baseline 8/31: 40%, 08/25/20=34%    Time 8    Period Weeks    Status Partially Met    Target Date 09/08/20      PT LONG TERM GOAL #4   Title Patient will increase BLE gross strength to 4+/5 as to improve functional strength for independent gait, increased standing tolerance and increased ADL ability.    Baseline 8/31: see note, BLE 3/5 hip and knee    Time 8    Status Partially Met    Target Date 09/08/20      PT LONG TERM GOAL #5    Title Patient will increase strength of UE's to 4/5 with minimal pain to improve ability to perform work and home tasks and return to PLOF.    Baseline 7/20: see note 8/25: 4+/5, 08/25/20= BLE 3/5 BLE    Time 8    Period Weeks    Status Partially Met                 Plan - 08/31/20 0859    Clinical Impression Statement Patient has some L buttocks/thigh pain 1/10 and had epidural injection x 5 days ago. She performs core strengthening with TA with no increased pain symptoms. She will continue to benefit from skilled PT to improve mobility and strength.    Personal Factors and Comorbidities Age;Comorbidity 3+;Finances;Fitness;Past/Current Experience;Profession;Time since onset of injury/illness/exacerbation    Comorbidities anxiety, HTN, obesity, PVD, thyroid disease, diverticulitis, HLD, bursitis trochanteric    Examination-Activity Limitations Caring for Others;Dressing;Squat;Stand;Transfers;Other;Bed Mobility;Locomotion Level;Lift;Stairs    Examination-Participation Restrictions Church;Cleaning;Community Activity;Driving;Meal Prep;Laundry;Shop;Yard Work;Other    Stability/Clinical Decision Making Evolving/Moderate complexity    Rehab Potential Fair    PT Frequency 2x / week    PT Duration 8 weeks    PT Treatment/Interventions ADLs/Self Care Home Management;Aquatic Therapy;Biofeedback;Cryotherapy;Electrical Stimulation;Iontophoresis 74m/ml Dexamethasone;Moist Heat;Traction;Ultrasound;Contrast Bath;Therapeutic activities;Therapeutic exercise;Manual techniques;Patient/family education;Manual lymph drainage;Compression bandaging;Passive range of motion;Dry needling;Vasopneumatic Device;Taping;Splinting;Energy conservation;Visual/perceptual remediation/compensation;Joint Manipulations;Gait training;Stair training;Functional mobility training;Neuromuscular re-education;Balance training;Spinal Manipulations    PT Next Visit Plan STM low back, repeated trunk extension, LE lengthening    PT Home  Exercise Plan see above    Consulted and Agree with Plan of Care Patient           Patient will benefit from skilled therapeutic intervention in order to improve the following deficits and impairments:  Decreased activity tolerance, Decreased endurance, Decreased mobility, Decreased range of motion, Decreased strength, Hypomobility, Increased edema, Impaired flexibility, Impaired perceived functional ability, Increased muscle spasms, Obesity, Postural dysfunction, Improper body mechanics, Pain, Abnormal gait, Decreased balance, Difficulty walking  Visit Diagnosis: Low back pain, unspecified back pain laterality, unspecified chronicity, unspecified whether sciatica present  Muscle weakness (generalized)  Abnormal posture  Problem List Patient Active Problem List   Diagnosis Date Noted   Endometrial hyperplasia without atypia, simple 07/21/2015   Diverticulosis 07/21/2015   Neuritis or radiculitis due to rupture of lumbar intervertebral disc 11/12/2014   Lumbar canal stenosis 11/12/2014   Bursitis, trochanteric 11/12/2014   Back ache 04/10/2014   Chronic anemia 04/10/2014   BP (high blood pressure) 04/10/2014   Blood glucose elevated 04/10/2014   HLD (hyperlipidemia) 04/10/2014   Adult hypothyroidism 04/10/2014   Adiposity 04/10/2014   Obstructive apnea 04/10/2014    Alanson Puls, PT DPT 08/31/2020, 9:00 AM  [place] MAIN [facility] SERVICES [address] [place], [place], [address] Phone: [phone]   Fax:  [phone]  Name: SHARNITA BOGUCKI MRN: [id number] Date of Birth: [birth year]

## 2020-09-02 ENCOUNTER — Ambulatory Visit: Payer: 59 | Admitting: Physical Therapy

## 2020-09-08 ENCOUNTER — Other Ambulatory Visit: Payer: Self-pay

## 2020-09-08 ENCOUNTER — Encounter: Payer: Self-pay | Admitting: Physical Therapy

## 2020-09-08 ENCOUNTER — Ambulatory Visit: Payer: 59 | Admitting: Physical Therapy

## 2020-09-08 DIAGNOSIS — M25612 Stiffness of left shoulder, not elsewhere classified: Secondary | ICD-10-CM | POA: Diagnosis not present

## 2020-09-08 DIAGNOSIS — M545 Low back pain, unspecified: Secondary | ICD-10-CM

## 2020-09-08 DIAGNOSIS — M25512 Pain in left shoulder: Secondary | ICD-10-CM | POA: Diagnosis not present

## 2020-09-08 DIAGNOSIS — M6281 Muscle weakness (generalized): Secondary | ICD-10-CM | POA: Diagnosis not present

## 2020-09-08 DIAGNOSIS — R293 Abnormal posture: Secondary | ICD-10-CM | POA: Diagnosis not present

## 2020-09-08 NOTE — Therapy (Signed)
[place] MAIN [facility] SERVICES [address] [place], [place], [address] Phone: [phone]   Fax:  [phone]  Physical Therapy Treatment  Patient Details  Name: Rebekah Perkins MRN: [id number] Date of Birth: Sep 27, 1956 Referring Provider (PT): Meeler, Time, Carlean Jews    Encounter Date: 09/08/2020   PT End of Session - 09/08/20 0945    Visit Number 12    Date for PT Re-Evaluation 09/08/20    PT Start Time 0930    PT Stop Time 1010    PT Time Calculation (min) 40 min    Equipment Utilized During Treatment Gait belt    Activity Tolerance Patient limited by pain    Behavior During Therapy [facility] for tasks assessed/performed           Past Medical History:  Diagnosis Date  . Anxiety   . Apnea   . Diverticulitis   . Environmental allergies   . Hypertension   . Menopausal state   . Obesity   . PMB (postmenopausal bleeding)   . PVD (peripheral vascular disease) ([place])   . Rosacea   . Simple endometrial hyperplasia   . Thyroid disease   . Urinary urgency     Past Surgical History:  Procedure Laterality Date  . CHOLECYSTECTOMY    . DILATION AND CURETTAGE OF UTERUS    . HYSTEROSCOPY      There were no vitals filed for this visit.   Subjective Assessment - 09/08/20 0943    Subjective Patient says that in standing she is feeling better but when she is sitting she feels it in her buttocks    Pertinent History Patient is a pleasant 64 year old female who presents for DDD, back muscle spasm, and lumbar radiculitis. Patient has seen this therapist in the past for her shoulder. Patient has history of back pain however beginning in June 2021 she has had extreme exacerbation that has limited her quality of life. Patient is getting an MRI on Sept 14th, 2021 as one has not been completed since 2015. Pain is described as radiating down left and right LE's with left side being primary source of radiating pain. Has not had any episodes of bowel and  bladder but does feel weakness of legs. She also describes pain at the lateral hips interferes with her ability to lie on her sides. Sitting feels like she is being stabbed. Leaning forward is additionally painful. Laying down relieves pain when flat on back. 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    Limitations Sitting;Lifting;Walking;House hold activities;Other (comment)    How long can you sit comfortably? 5 minutes severe pain: feels sitting on sharp rocks    How long can you stand comfortably? not limited by back pain    How long can you walk comfortably? loosens with prolonged walking    Diagnostic tests awaiting MRI onthe 14th    Patient Stated Goals to reduce pain, be able to sit again    Pain Onset More than a month ago             Treatment: Nu-step x 5 mins with ice  Treatment: hooklying hip ER/abd x 20 x 2  hooklying marching x 20 x 2  Trunk rotation left and right x 20 x 2  hooklying clam x 20 x 2  Leg press 40 lbs x 20 x 2  Patient has no LE pain following treatment and 0/10 back pain following treatment Patient performed with instruction,  verbal cues, tactile cues of therapist: goal:increase tissue extensibility, promote proper posture, improve mobility                        PT Education - 09/08/20 0943    Education Details HEP    Person(s) Educated Patient    Methods Explanation    Comprehension Verbalized understanding            PT Short Term Goals - 08/25/20 0946      PT SHORT TERM GOAL #1   Title Patient will be independent in home exercise program to improve strength/mobility for better functional independence with ADLs.    Baseline 8/31: HEP given    Time 4    Period Weeks    Status Achieved    Target Date 08/11/20             PT Long Term Goals - 08/25/20 0946      PT LONG TERM GOAL #1   Title Patient will increase FOTO score to equal to or greater than  64%   to demonstrate statistically  significant improvement in mobility and quality of life.    Baseline 8/31: 51%, 08/25/20=51    Time 8    Period Weeks    Status Partially Met      PT LONG TERM GOAL #2   Title Patient will report a worst pain of 3/10 on VAS in lumbar region to improve tolerance with ADLs and reduced symptoms with activities.    Baseline 8/31: 10/10, 08/25/20= 1/10    Time 8    Period Weeks    Status Partially Met      PT LONG TERM GOAL #3   Title Patient will reduce modified Oswestry score to <20 as to demonstrate minimal disability with ADLs including improved sleeping tolerance, walking/sitting tolerance etc for better mobility with ADLs    Baseline 8/31: 40%, 08/25/20=34%    Time 8    Period Weeks    Status Partially Met    Target Date 09/08/20      PT LONG TERM GOAL #4   Title Patient will increase BLE gross strength to 4+/5 as to improve functional strength for independent gait, increased standing tolerance and increased ADL ability.    Baseline 8/31: see note, BLE 3/5 hip and knee    Time 8    Status Partially Met    Target Date 09/08/20      PT LONG TERM GOAL #5   Title Patient will increase strength of UE's to 4/5 with minimal pain to improve ability to perform work and home tasks and return to PLOF.    Baseline 7/20: see note 8/25: 4+/5, 08/25/20= BLE 3/5 BLE    Time 8    Period Weeks    Status Partially Met                 Plan - 09/08/20 0946    Clinical Impression Statement Patient has some L buttocks/thigh pain 1/10 and had epidural injection x 5 days ago. She performs core strengthening with TA with no increased pain symptoms. She will continue to benefit from skilled PT to improve mobility and strength   Personal Factors and Comorbidities Age;Comorbidity 3+;Finances;Fitness;Past/Current Experience;Profession;Time since onset of injury/illness/exacerbation    Comorbidities anxiety, HTN, obesity, PVD, thyroid disease, diverticulitis, HLD, bursitis trochanteric     Examination-Activity Limitations Caring for Others;Dressing;Squat;Stand;Transfers;Other;Bed Mobility;Locomotion Level;Lift;Stairs    Examination-Participation Restrictions Church;Cleaning;Community Activity;Driving;Meal Prep;Laundry;Shop;Yard Work;Other    Stability/Clinical Decision  Making Evolving/Moderate complexity    Rehab Potential Fair    PT Frequency 2x / week    PT Duration 8 weeks    PT Treatment/Interventions ADLs/Self Care Home Management;Aquatic Therapy;Biofeedback;Cryotherapy;Electrical Stimulation;Iontophoresis 14m/ml Dexamethasone;Moist Heat;Traction;Ultrasound;Contrast Bath;Therapeutic activities;Therapeutic exercise;Manual techniques;Patient/family education;Manual lymph drainage;Compression bandaging;Passive range of motion;Dry needling;Vasopneumatic Device;Taping;Splinting;Energy conservation;Visual/perceptual remediation/compensation;Joint Manipulations;Gait training;Stair training;Functional mobility training;Neuromuscular re-education;Balance training;Spinal Manipulations    PT Next Visit Plan STM low back, repeated trunk extension, LE lengthening    PT Home Exercise Plan see above    Consulted and Agree with Plan of Care Patient           Patient will benefit from skilled therapeutic intervention in order to improve the following deficits and impairments:  Decreased activity tolerance, Decreased endurance, Decreased mobility, Decreased range of motion, Decreased strength, Hypomobility, Increased edema, Impaired flexibility, Impaired perceived functional ability, Increased muscle spasms, Obesity, Postural dysfunction, Improper body mechanics, Pain, Abnormal gait, Decreased balance, Difficulty walking  Visit Diagnosis: Muscle weakness (generalized)  Low back pain, unspecified back pain laterality, unspecified chronicity, unspecified whether sciatica present  Abnormal posture     Problem List Patient Active Problem List   Diagnosis Date Noted  . Endometrial  hyperplasia without atypia, simple 07/21/2015  . Diverticulosis 07/21/2015  . Neuritis or radiculitis due to rupture of lumbar intervertebral disc 11/12/2014  . Lumbar canal stenosis 11/12/2014  . Bursitis, trochanteric 11/12/2014  . Back ache 04/10/2014  . Chronic anemia 04/10/2014  . BP (high blood pressure) 04/10/2014  . Blood glucose elevated 04/10/2014  . HLD (hyperlipidemia) 04/10/2014  . Adult hypothyroidism 04/10/2014  . Adiposity 04/10/2014  . Obstructive apnea 04/10/2014    MAlanson Puls PVirginiaDPT 09/08/2020, 10:13 AM  [place] [facility] [address] [address]Phone: [phone]  Fax:  [phone] Name: DKINISHA SOPERMRN: [id number] of Birth: 4Jan 25, 1957

## 2020-09-10 ENCOUNTER — Other Ambulatory Visit: Payer: Self-pay | Admitting: Internal Medicine

## 2020-09-10 ENCOUNTER — Ambulatory Visit: Payer: 59 | Admitting: Physical Therapy

## 2020-09-10 DIAGNOSIS — G4733 Obstructive sleep apnea (adult) (pediatric): Secondary | ICD-10-CM | POA: Diagnosis not present

## 2020-09-10 DIAGNOSIS — Z0000 Encounter for general adult medical examination without abnormal findings: Secondary | ICD-10-CM | POA: Diagnosis not present

## 2020-09-10 DIAGNOSIS — I10 Essential (primary) hypertension: Secondary | ICD-10-CM | POA: Diagnosis not present

## 2020-09-10 DIAGNOSIS — Z23 Encounter for immunization: Secondary | ICD-10-CM | POA: Diagnosis not present

## 2020-09-10 DIAGNOSIS — Z1231 Encounter for screening mammogram for malignant neoplasm of breast: Secondary | ICD-10-CM

## 2020-09-11 DIAGNOSIS — I10 Essential (primary) hypertension: Secondary | ICD-10-CM | POA: Diagnosis not present

## 2020-09-11 DIAGNOSIS — E782 Mixed hyperlipidemia: Secondary | ICD-10-CM | POA: Diagnosis not present

## 2020-09-11 DIAGNOSIS — R739 Hyperglycemia, unspecified: Secondary | ICD-10-CM | POA: Diagnosis not present

## 2020-09-11 DIAGNOSIS — Z79899 Other long term (current) drug therapy: Secondary | ICD-10-CM | POA: Diagnosis not present

## 2020-09-16 ENCOUNTER — Ambulatory Visit: Payer: 59 | Attending: Family Medicine | Admitting: Physical Therapy

## 2020-09-16 ENCOUNTER — Encounter: Payer: Self-pay | Admitting: Physical Therapy

## 2020-09-16 ENCOUNTER — Other Ambulatory Visit: Payer: Self-pay

## 2020-09-16 DIAGNOSIS — M545 Low back pain, unspecified: Secondary | ICD-10-CM | POA: Insufficient documentation

## 2020-09-16 DIAGNOSIS — M6281 Muscle weakness (generalized): Secondary | ICD-10-CM | POA: Diagnosis not present

## 2020-09-16 DIAGNOSIS — R293 Abnormal posture: Secondary | ICD-10-CM | POA: Insufficient documentation

## 2020-09-16 NOTE — Therapy (Addendum)
[place] MAIN [facility] SERVICES [address] [place], [place], [address] Phone: [phone]   Fax:  [phone]  Physical Therapy Treatment  Patient Details  Name: Rebekah Perkins MRN: [id number] Date of Birth: [birth year] Referring Provider (PT): Meeler, [place], Carlean Jews    Encounter Date: 09/16/2020   PT End of Session - 09/16/20 0938    Visit Number 13    Number of Visits 16    Date for PT Re-Evaluation 11/03/20    Authorization Type 1/10 eval 8/31    PT Start Time 0930    PT Stop Time 1010    PT Time Calculation (min) 40 min    Equipment Utilized During Treatment Gait belt    Activity Tolerance Patient limited by pain    Behavior During Therapy [facility] for tasks assessed/performed           Past Medical History:  Diagnosis Date  . Anxiety   . Apnea   . Diverticulitis   . Environmental allergies   . Hypertension   . Menopausal state   . Obesity   . PMB (postmenopausal bleeding)   . PVD (peripheral vascular disease) ([place])   . Rosacea   . Simple endometrial hyperplasia   . Thyroid disease   . Urinary urgency     Past Surgical History:  Procedure Laterality Date  . CHOLECYSTECTOMY    . DILATION AND CURETTAGE OF UTERUS    . HYSTEROSCOPY      There were no vitals filed for this visit.   Subjective Assessment - 09/16/20 0934    Subjective Patient is taking a muscle relaxer and she feels like she is spaced out. She only wants to take it 2 x / day. She was taking a lot of tyleol 8-10 / day. She is having mild back pain.Her back pain is 1/10 and it gets up to a 7 or 8/10. Ice helps.    Pertinent History Patient is a pleasant 64 year old female who presents for DDD, back muscle spasm, and lumbar radiculitis. Patient has seen this therapist in the past for her shoulder. Patient has history of back pain however beginning in June 2021 she has had extreme exacerbation that has limited her quality of life. Patient is getting an MRI on Sept 14th,  2021 as one has not been completed since 2015. Pain is described as radiating down left and right LE's with left side being primary source of radiating pain. Has not had any episodes of bowel and bladder but does feel weakness of legs. She also describes pain at the lateral hips interferes with her ability to lie on her sides. Sitting feels like she is being stabbed. Leaning forward is additionally painful. Laying down relieves pain when flat on back. 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    Limitations Sitting;Lifting;Walking;House hold activities;Other (comment)    How long can you sit comfortably? 5 minutes severe pain: feels sitting on sharp rocks    How long can you stand comfortably? not limited by back pain    How long can you walk comfortably? loosens with prolonged walking    Diagnostic tests awaiting MRI onthe 14th    Patient Stated Goals to reduce pain, be able to sit again    Currently in Pain? Yes    Pain Score 1     Pain Location Back    Pain Onset More than a month ago  Treatment: Nu-step x 5 mins Manual therapy: PA glides grade 2 and 3 x 30 bouts L1- L5 Treatment: hooklying hip ER/abd x 20 x 2  hooklying marching x 20 x 2  Trunk rotation left and right x 20 x 2  hooklying clam x 20 x 2 Leg press 40 lbs x 20 x 2  Patient has no LE pain following treatment and 0/10 back pain following treatment Patient performed with instruction, verbal cues, tactile cues of therapist: goal:increase tissue extensibility, promote proper posture, improve mobility                         PT Education - 09/16/20 0937    Education Details HEP    Person(s) Educated Patient    Methods Explanation    Comprehension Verbalized understanding            PT Short Term Goals - 08/25/20 0946      PT SHORT TERM GOAL #1   Title Patient will be independent in home exercise program to improve strength/mobility for better functional  independence with ADLs.    Baseline 8/31: HEP given    Time 4    Period Weeks    Status Achieved    Target Date 08/11/20             PT Long Term Goals - 09/17/20 0807      PT LONG TERM GOAL #1   Title Patient will increase FOTO score to equal to or greater than  64%   to demonstrate statistically significant improvement in mobility and quality of life.    Baseline 8/31: 51%, 08/25/20=51    Time 8    Period Weeks    Status Partially Met    Target Date 11/03/20      PT LONG TERM GOAL #2   Title Patient will report a worst pain of 3/10 on VAS in lumbar region to improve tolerance with ADLs and reduced symptoms with activities.    Baseline 8/31: 10/10, 08/25/20= 1/10    Time 8    Period Weeks    Status Partially Met    Target Date 11/03/20      PT LONG TERM GOAL #3   Title Patient will reduce modified Oswestry score to <20 as to demonstrate minimal disability with ADLs including improved sleeping tolerance, walking/sitting tolerance etc for better mobility with ADLs    Baseline 8/31: 40%, 08/25/20=34%    Time 8    Period Weeks    Status Partially Met    Target Date 11/03/20      PT LONG TERM GOAL #4   Title Patient will increase BLE gross strength to 4+/5 as to improve functional strength for independent gait, increased standing tolerance and increased ADL ability.    Baseline 8/31: see note, BLE 3/5 hip and knee    Time 8    Status Partially Met    Target Date 11/03/20      PT LONG TERM GOAL #5   Title Patient will increase strength of UE's to 4/5 with minimal pain to improve ability to perform work and home tasks and return to PLOF.    Baseline 7/20: see note 8/25: 4+/5, 08/25/20= BLE 3/5 BLE    Time 8    Period Weeks    Status Partially Met    Target Date 11/03/20                 Plan - 09/16/20 0940  Clinical Impression Statement Patient continues to have minimal back pain that changes location and gets a bit worse during the day while she is working.  She responds to manual therapy and is able to perform exercises that do not aggravate her pain level. She will continue to benefit from skilled PT to improve mobility and strength..    Personal Factors and Comorbidities Age;Comorbidity 3+;Finances;Fitness;Past/Current Experience;Profession;Time since onset of injury/illness/exacerbation    Comorbidities anxiety, HTN, obesity, PVD, thyroid disease, diverticulitis, HLD, bursitis trochanteric    Examination-Activity Limitations Caring for Others;Dressing;Squat;Stand;Transfers;Other;Bed Mobility;Locomotion Level;Lift;Stairs    Examination-Participation Restrictions Church;Cleaning;Community Activity;Driving;Meal Prep;Laundry;Shop;Yard Work;Other    Stability/Clinical Decision Making Evolving/Moderate complexity    Rehab Potential Fair    PT Frequency 2x / week    PT Duration 8 weeks    PT Treatment/Interventions ADLs/Self Care Home Management;Aquatic Therapy;Biofeedback;Cryotherapy;Electrical Stimulation;Iontophoresis 64m/ml Dexamethasone;Moist Heat;Traction;Ultrasound;Contrast Bath;Therapeutic activities;Therapeutic exercise;Manual techniques;Patient/family education;Manual lymph drainage;Compression bandaging;Passive range of motion;Dry needling;Vasopneumatic Device;Taping;Splinting;Energy conservation;Visual/perceptual remediation/compensation;Joint Manipulations;Gait training;Stair training;Functional mobility training;Neuromuscular re-education;Balance training;Spinal Manipulations    PT Next Visit Plan STM low back, repeated trunk extension, LE lengthening    PT Home Exercise Plan see above    Consulted and Agree with Plan of Care Patient           Patient will benefit from skilled therapeutic intervention in order to improve the following deficits and impairments:  Decreased activity tolerance, Decreased endurance, Decreased mobility, Decreased range of motion, Decreased strength, Hypomobility, Increased edema, Impaired flexibility, Impaired  perceived functional ability, Increased muscle spasms, Obesity, Postural dysfunction, Improper body mechanics, Pain, Abnormal gait, Decreased balance, Difficulty walking  Visit Diagnosis: Muscle weakness (generalized) - Plan: PT plan of care cert/re-cert  Low back pain, unspecified back pain laterality, unspecified chronicity, unspecified whether sciatica present - Plan: PT plan of care cert/re-cert  Abnormal posture - Plan: PT plan of care cert/re-cert     Problem List Patient Active Problem List   Diagnosis Date Noted  . Endometrial hyperplasia without atypia, simple 07/21/2015  . Diverticulosis 07/21/2015  . Neuritis or radiculitis due to rupture of lumbar intervertebral disc 11/12/2014  . Lumbar canal stenosis 11/12/2014  . Bursitis, trochanteric 11/12/2014  . Back ache 04/10/2014  . Chronic anemia 04/10/2014  . BP (high blood pressure) 04/10/2014  . Blood glucose elevated 04/10/2014  . HLD (hyperlipidemia) 04/10/2014  . Adult hypothyroidism 04/10/2014  . Adiposity 04/10/2014  . Obstructive apnea 04/10/2014    MAlanson Puls PVirginiaDPT 09/17/2020, 8:08 AM  [place]MAIN RN[facility] HospitalSERVICES [address] [address]Phone: [phone]  Fax:  [phone] Name: Rebekah GENEMRN: [id number] of Birth: [birth year]

## 2020-09-17 NOTE — Addendum Note (Signed)
Addended by: Alanson Puls on: 09/17/2020 08:09 AM   Modules accepted: Orders

## 2020-09-21 ENCOUNTER — Ambulatory Visit: Payer: 59 | Admitting: Physical Therapy

## 2020-09-21 ENCOUNTER — Other Ambulatory Visit: Payer: Self-pay

## 2020-09-21 ENCOUNTER — Emergency Department
Admission: EM | Admit: 2020-09-21 | Discharge: 2020-09-21 | Disposition: A | Discharge status: Home / Self Care | Payer: PRIVATE HEALTH INSURANCE | Attending: Emergency Medicine | Admitting: Emergency Medicine

## 2020-09-21 ENCOUNTER — Emergency Department: Payer: PRIVATE HEALTH INSURANCE

## 2020-09-21 DIAGNOSIS — I10 Essential (primary) hypertension: Secondary | ICD-10-CM | POA: Diagnosis not present

## 2020-09-21 DIAGNOSIS — Z79899 Other long term (current) drug therapy: Secondary | ICD-10-CM | POA: Diagnosis not present

## 2020-09-21 DIAGNOSIS — E039 Hypothyroidism, unspecified: Secondary | ICD-10-CM | POA: Insufficient documentation

## 2020-09-21 DIAGNOSIS — W231XXA Caught, crushed, jammed, or pinched between stationary objects, initial encounter: Secondary | ICD-10-CM | POA: Diagnosis not present

## 2020-09-21 DIAGNOSIS — Y92239 Unspecified place in hospital as the place of occurrence of the external cause: Secondary | ICD-10-CM | POA: Insufficient documentation

## 2020-09-21 DIAGNOSIS — S61512A Laceration without foreign body of left wrist, initial encounter: Secondary | ICD-10-CM | POA: Insufficient documentation

## 2020-09-21 DIAGNOSIS — Y990 Civilian activity done for income or pay: Secondary | ICD-10-CM | POA: Diagnosis not present

## 2020-09-21 DIAGNOSIS — Z23 Encounter for immunization: Secondary | ICD-10-CM | POA: Insufficient documentation

## 2020-09-21 DIAGNOSIS — S60212A Contusion of left wrist, initial encounter: Secondary | ICD-10-CM

## 2020-09-21 DIAGNOSIS — Z7982 Long term (current) use of aspirin: Secondary | ICD-10-CM | POA: Diagnosis not present

## 2020-09-21 MED ORDER — ACETAMINOPHEN 325 MG PO TABS
650.0000 mg | ORAL_TABLET | Freq: Once | ORAL | Status: AC
Start: 2020-09-21 — End: 2020-09-21
  Administered 2020-09-21: 650 mg via ORAL
  Filled 2020-09-21: qty 2

## 2020-09-21 MED ORDER — LIDOCAINE-EPINEPHRINE-TETRACAINE (LET) SOLUTION
3.0000 mL | Freq: Once | NASAL | Status: AC
  Administered 2020-09-21: 3 mL via TOPICAL
  Filled 2020-09-21 (×2): qty 3

## 2020-09-21 MED ORDER — TETANUS-DIPHTH-ACELL PERTUSSIS 5-2.5-18.5 LF-MCG/0.5 IM SUSY
0.5000 mL | PREFILLED_SYRINGE | Freq: Once | INTRAMUSCULAR | Status: AC
  Administered 2020-09-21: 0.5 mL via INTRAMUSCULAR
  Filled 2020-09-21 (×2): qty 0.5

## 2020-09-21 NOTE — ED Provider Notes (Addendum)
[facility] Emergency Department Provider Note ____________________________________________  Time seen: 1320  I have reviewed the triage vital signs and the nursing notes.  HISTORY  Chief Complaint  Laceration  HPI Rebekah Perkins is a 64 y.o. female presents to the ED for evaluation of injury sustained to the left wrist after she got her arm caught between her laundry cart and injured a wall.  The injury happened just prior to presentation while here at work.  Patient denies any other injury at this time.  She presents with pain and swelling to the dorsal left wrist as well as a superficial skin tear in the same region.  She reports an up-to-date tetanus status at this time.   Past Medical History:  Diagnosis Date  . Anxiety   . Apnea   . Diverticulitis   . Environmental allergies   . Hypertension   . Menopausal state   . Obesity   . PMB (postmenopausal bleeding)   . PVD (peripheral vascular disease) ([place])   . Rosacea   . Simple endometrial hyperplasia   . Thyroid disease   . Urinary urgency     Patient Active Problem List   Diagnosis Date Noted  . Endometrial hyperplasia without atypia, simple 07/21/2015  . Diverticulosis 07/21/2015  . Neuritis or radiculitis due to rupture of lumbar intervertebral disc 11/12/2014  . Lumbar canal stenosis 11/12/2014  . Bursitis, trochanteric 11/12/2014  . Back ache 04/10/2014  . Chronic anemia 04/10/2014  . BP (high blood pressure) 04/10/2014  . Blood glucose elevated 04/10/2014  . HLD (hyperlipidemia) 04/10/2014  . Adult hypothyroidism 04/10/2014  . Adiposity 04/10/2014  . Obstructive apnea 04/10/2014    Past Surgical History:  Procedure Laterality Date  . CHOLECYSTECTOMY    . DILATION AND CURETTAGE OF UTERUS    . HYSTEROSCOPY      Prior to Admission medications   Medication Sig Start Date End Date Taking? Authorizing Provider  acetaminophen (TYLENOL) 500 MG tablet Take 1,500 mg by mouth every 6 (six)  hours as needed for mild pain or headache.    [provider]  aspirin EC 81 MG tablet Take 81 mg by mouth daily.    [provider]  bacitracin-polymyxin b (POLYSPORIN) ophthalmic ointment Place 1 application into the left eye 3 (three) times daily. 05/13/19   Darlin Priestly, PA-C  bisoprolol-hydrochlorothiazide [facility]) 5-6.25 MG per tablet Take 1 tablet by mouth daily.    [provider]  calcium carbonate (OS-CAL) 600 MG TABS tablet Take 600 mg by mouth at bedtime.    [provider]  cetirizine (ZYRTEC) 10 MG tablet Take 10 mg by mouth at bedtime.    [provider]  docusate sodium (COLACE) 100 MG capsule Take 100 mg by mouth 2 (two) times daily.    [provider]  doxycycline (VIBRA-TABS) 100 MG tablet Take 1 tablet (100 mg total) by mouth 2 (two) times daily. 1 po bid Patient not taking: Reported on 05/13/2019 01/29/19   Chevis Pretty, FNP  famotidine (PEPCID) 20 MG tablet Take 1 tablet (20 mg total) by mouth 2 (two) times daily. 04/19/18   Scot Jun, FNP  ferrous sulfate 325 (65 FE) MG tablet Take 325 mg by mouth at bedtime.    [provider]  fluticasone (FLONASE) 50 MCG/ACT nasal spray Place 2 sprays into both nostrils daily.    [provider]  gabapentin (NEURONTIN) 300 MG capsule Take 300 mg by mouth at bedtime.    [provider]  hydrochlorothiazide (HYDRODIURIL) 25 MG tablet Take 25 mg by mouth daily.    [provider]  hydrOXYzine (ATARAX/VISTARIL) 25 MG tablet Take 0.5-1 tablets (12.5-25 mg total) by mouth every 8 (eight) hours as needed for itching. 04/19/18   Scot Jun, FNP  levothyroxine (SYNTHROID, LEVOTHROID) 150 MCG tablet TAKE 1 TABLET BY MOUTH ONCE DAILY. TAKE ON AN EMPTY STOMACH WITH A GLASS OF WATER AT LEAST 30-60 MINUTES BEFORE BREAKFAST. 05/09/17   [provider]  medroxyPROGESTERone (PROVERA) 10 MG tablet Take 1 tablet (10 mg total) by mouth daily.  Days 1 through 10 01/16/19   Harlin Heys, MD  pantoprazole (PROTONIX) 40 MG tablet Take by mouth. 04/19/16 01/11/18  [provider]  phentermine (ADIPEX-P) 37.5 MG tablet Take 37.5 mg by mouth daily.    [provider]  traZODone (DESYREL) 50 MG tablet Take by mouth. 04/19/16 01/11/18  [provider]  traZODone (DESYREL) 50 MG tablet  04/03/18   [provider]  vitamin B-12 (CYANOCOBALAMIN) 1000 MCG tablet Take 1,000 mcg by mouth daily.    [provider]    Allergies Azithromycin and Penicillins  Family History  Problem Relation Age of Onset  . Cancer Maternal Grandmother   . Colon cancer Maternal Grandmother   . Breast cancer Maternal Grandmother 80  . Diabetes Mother   . Heart disease Mother   . Diabetes Sister   . Breast cancer Sister   . Cancer Sister   . Breast cancer Maternal Aunt 60  . Lung cancer Maternal Aunt   . Ovarian cancer Neg Hx     Social History Social History   Tobacco Use  . Smoking status: Passive Smoke Exposure - Never Smoker  . Smokeless tobacco: Never Used  Vaping Use  . Vaping Use: Never used  Substance Use Topics  . Alcohol use: No  . Drug use: No    Review of Systems  Constitutional: Negative for fever. Cardiovascular: Negative for chest pain. Respiratory: Negative for shortness of breath. Gastrointestinal: Negative for abdominal pain, vomiting and diarrhea. Genitourinary: Negative for dysuria. Musculoskeletal: Negative for back pain. Left wrist pain as above Skin: Negative for rash. Neurological: Negative for headaches, focal weakness or numbness. ____________________________________________  PHYSICAL EXAM:  VITAL SIGNS: ED Triage Vitals  Enc Vitals Group     BP 09/21/20 1258 120/88     Pulse Rate 09/21/20 1258 67     Resp 09/21/20 1258 18     Temp 09/21/20 1258 97.7 F (36.5 C)     Temp Source 09/21/20 1258 Oral     SpO2 09/21/20 1258 100 %     Weight 09/21/20 1259 260 lb (117.9  kg)     Height 09/21/20 1259 5\' 3"  (1.6 m)     Head Circumference --      Peak Flow --      Pain Score 09/21/20 1259 8     Pain Loc --      Pain Edu? --      Excl. in [place]? --     Constitutional: Alert and oriented. Well appearing and in no distress. Head: Normocephalic and atraumatic. Eyes: Conjunctivae are normal. Normal extraocular movements Cardiovascular: Normal rate, regular rhythm. Normal distal pulses. Respiratory: Normal respiratory effort.  Musculoskeletal: Left hand and wrist without obvious deformity or dislocation.  Patient with some subtle soft tissue swelling of the left the dorsum of the wrist.  Is also a superficial skin tear in the same region.  Normal composite fist  distally.  Nontender with normal range of motion in all extremities.  Neurologic:  Normal gross sensation. Normal speech and language. No gross focal neurologic deficits are appreciated. Skin:  Skin is warm, dry and intact. No rash noted. Psychiatric: Mood and affect are normal. Patient exhibits appropriate insight and judgment. ____________________________________________   RADIOLOGY  DG Left Wrist  Negative  ____________________________________________  PROCEDURES  Wound care Wound dressing Tylenol  650 mg PO Tdap 0.5 ml IM   .Marland KitchenLaceration Repair  Date/Time: 09/21/2020 1:50 PM Performed by: Melvenia Needles, PA-C Authorized by: Melvenia Needles, PA-C   Consent:    Consent obtained:  Verbal   Consent given by:  Patient   Risks discussed:  Pain and poor wound healing   Alternatives discussed:  No treatment Anesthesia (see MAR for exact dosages):    Anesthesia method:  Topical application   Topical anesthetic:  LET Laceration details:    Location:  Shoulder/arm   Shoulder/arm location:  L lower arm   Length (cm):  4   Depth (mm):  2 Treatment:    Area cleansed with:  Soap and water   Amount of cleaning:  Standard Post-procedure details:    Dressing:  Non-adherent  dressing   Patient tolerance of procedure:  Tolerated well, no immediate complications Comments:     No wound repair required for this superficial skin tear.   ____________________________________________  INITIAL IMPRESSION / ASSESSMENT AND PLAN / ED COURSE  Patient with ED evaluation of injury sustained while at work.  She had her left wrist caught between her laundry cart and the edge of the wall that she exited the elevator.  She sustained a skin tear laceration over the skin and some soft tissue swelling.  She presents for evaluation of injuries.  Wound was cleansed and topical let was applied for analgesia.  A nonadhering resting was applied.  Patient was discharged to follow-up with employee health ongoing symptoms.  A work note is provided for the remainder of today.  Rebekah Perkins was evaluated in Emergency Department on 09/21/2020 for the symptoms described in the history of present illness. She was evaluated in the context of the global COVID-19 pandemic, which necessitated consideration that the patient might be at risk for infection with the SARS-CoV-2 virus that causes COVID-19. Institutional protocols and algorithms that pertain to the evaluation of patients at risk for COVID-19 are in a state of rapid change based on information released by regulatory bodies including the CDC and federal and state organizations. These policies and algorithms were followed during the patient's care in the ED. ____________________________________________  FINAL CLINICAL IMPRESSION(S) / ED DIAGNOSES  Final diagnoses:  Tear of skin of left wrist, initial encounter  Contusion of left wrist, initial encounter      Melvenia Needles, PA-C 09/21/20 1552    Taneia Mealor, Dannielle Karvonen, PA-C 09/21/20 1552    Carrie Mew, MD 09/23/20 2017

## 2020-09-21 NOTE — ED Notes (Signed)
Per Thurmond Butts, pt supervisor, [phone] pt does not need urine drug screen.

## 2020-09-21 NOTE — Discharge Instructions (Signed)
Your exam and XR are normal at this time. You have a contusion to the wrist without a fracture. You do have a skin tear, that was cleansed and dressed. Keep the wound clean, dry, and covered. Follow-up with [facility] for continued symptoms. Take OTC Tylenol as needed.

## 2020-09-21 NOTE — ED Triage Notes (Signed)
Pt to ed for workers comp [facility] for laceration to right lower arm/wrist area. States arm got caught between cart and edge of wall. Bleeding controlled at this time Alert and oriented, steady gait, NAD noted

## 2020-09-23 ENCOUNTER — Ambulatory Visit: Payer: 59 | Admitting: Physical Therapy

## 2020-09-23 DIAGNOSIS — M5416 Radiculopathy, lumbar region: Secondary | ICD-10-CM | POA: Diagnosis not present

## 2020-09-23 DIAGNOSIS — M5136 Other intervertebral disc degeneration, lumbar region: Secondary | ICD-10-CM | POA: Diagnosis not present

## 2020-09-23 DIAGNOSIS — M48062 Spinal stenosis, lumbar region with neurogenic claudication: Secondary | ICD-10-CM | POA: Diagnosis not present

## 2020-09-28 ENCOUNTER — Ambulatory Visit: Payer: 59 | Admitting: Physical Therapy

## 2020-09-28 DIAGNOSIS — H2513 Age-related nuclear cataract, bilateral: Secondary | ICD-10-CM | POA: Diagnosis not present

## 2020-09-30 ENCOUNTER — Other Ambulatory Visit: Payer: Self-pay

## 2020-09-30 ENCOUNTER — Ambulatory Visit: Payer: 59 | Admitting: Physical Therapy

## 2020-09-30 ENCOUNTER — Encounter: Payer: Self-pay | Admitting: Physical Therapy

## 2020-09-30 DIAGNOSIS — R293 Abnormal posture: Secondary | ICD-10-CM

## 2020-09-30 DIAGNOSIS — M6281 Muscle weakness (generalized): Secondary | ICD-10-CM

## 2020-09-30 DIAGNOSIS — M545 Low back pain, unspecified: Secondary | ICD-10-CM | POA: Diagnosis not present

## 2020-09-30 NOTE — Therapy (Signed)
[place] MAIN [facility] SERVICES [address] [place], [place], [address] Phone: [phone]   Fax:  [phone]  Physical Therapy Treatment  Patient Details  Name: Rebekah Perkins MRN: [id number] Date of Birth: 1956/02/24 Referring Provider (PT): [place], [place], Carlean Jews    Encounter Date: 09/30/2020   PT End of Session - 09/30/20 0843    Visit Number 14    Number of Visits 16    Date for PT Re-Evaluation 11/03/20    Authorization Type 1/10 eval 8/31    PT Start Time 0845    PT Stop Time 0925    PT Time Calculation (min) 40 min    Equipment Utilized During Treatment Gait belt    Activity Tolerance Patient limited by pain    Behavior During Therapy WFL for tasks assessed/performed           Past Medical History:  Diagnosis Date   Anxiety    Apnea    Diverticulitis    Environmental allergies    Hypertension    Menopausal state    Obesity    PMB (postmenopausal bleeding)    PVD (peripheral vascular disease) ([place])    Rosacea    Simple endometrial hyperplasia    Thyroid disease    Urinary urgency     Past Surgical History:  Procedure Laterality Date   CHOLECYSTECTOMY     DILATION AND CURETTAGE OF UTERUS     HYSTEROSCOPY      There were no vitals filed for this visit.   Subjective Assessment - 09/30/20 0853    Subjective Patient has good days and bad days with her back. Usually the AM is better and as the day goes on she has increased pain.    Pertinent History Patient is a pleasant 64 year old female who presents for DDD, back muscle spasm, and lumbar radiculitis. Patient has seen this therapist in the past for her shoulder. Patient has history of back pain however beginning in June 2021 she has had extreme exacerbation that has limited her quality of life. Patient is getting an MRI on Sept 14th, 2021 as one has not been completed since 2015. Pain is described as radiating down left and right LE's with left side  being primary source of radiating pain. Has not had any episodes of bowel and bladder but does feel weakness of legs. She also describes pain at the lateral hips interferes with her ability to lie on her sides. Sitting feels like she is being stabbed. Leaning forward is additionally painful. Laying down relieves pain when flat on back. 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    Limitations Sitting;Lifting;Walking;House hold activities;Other (comment)    How long can you sit comfortably? 5 minutes severe pain: feels sitting on sharp rocks    How long can you stand comfortably? not limited by back pain    How long can you walk comfortably? loosens with prolonged walking    Diagnostic tests awaiting MRI onthe 14th    Patient Stated Goals to reduce pain, be able to sit again    Currently in Pain? No/denies    Pain Score 0-No pain    Pain Onset More than a month ago           Treatment: Nu-step x 5 mins Manual therapy: PA glides grade 2 and 3 x 30 bouts L1- L5 Treatment: hooklying hip ER/abd x 20 x 2  hooklying marching x 20 x 2  Trunk  rotation left and right x 20 x 2  hooklying clam x 20 x 2 Leg press 40 lbs x 20 x 2 Patient has no LE pain following treatment and 0/10 back pain following treatment Patient performed with instruction, verbal cues, tactile cues of therapist: goal:increase tissue extensibility, promote proper posture, improve mobility                           PT Education - 09/30/20 0842    Education Details hep    Person(s) Educated Patient    Methods Explanation    Comprehension Verbalized understanding            PT Short Term Goals - 08/25/20 0946      PT SHORT TERM GOAL #1   Title Patient will be independent in home exercise program to improve strength/mobility for better functional independence with ADLs.    Baseline 8/31: HEP given    Time 4    Period Weeks    Status Achieved    Target Date 08/11/20              PT Long Term Goals - 09/17/20 0807      PT LONG TERM GOAL #1   Title Patient will increase FOTO score to equal to or greater than  64%   to demonstrate statistically significant improvement in mobility and quality of life.    Baseline 8/31: 51%, 08/25/20=51    Time 8    Period Weeks    Status Partially Met    Target Date 11/03/20      PT LONG TERM GOAL #2   Title Patient will report a worst pain of 3/10 on VAS in lumbar region to improve tolerance with ADLs and reduced symptoms with activities.    Baseline 8/31: 10/10, 08/25/20= 1/10    Time 8    Period Weeks    Status Partially Met    Target Date 11/03/20      PT LONG TERM GOAL #3   Title Patient will reduce modified Oswestry score to <20 as to demonstrate minimal disability with ADLs including improved sleeping tolerance, walking/sitting tolerance etc for better mobility with ADLs    Baseline 8/31: 40%, 08/25/20=34%    Time 8    Period Weeks    Status Partially Met    Target Date 11/03/20      PT LONG TERM GOAL #4   Title Patient will increase BLE gross strength to 4+/5 as to improve functional strength for independent gait, increased standing tolerance and increased ADL ability.    Baseline 8/31: see note, BLE 3/5 hip and knee    Time 8    Status Partially Met    Target Date 11/03/20      PT LONG TERM GOAL #5   Title Patient will increase strength of UE's to 4/5 with minimal pain to improve ability to perform work and home tasks and return to PLOF.    Baseline 7/20: see note 8/25: 4+/5, 08/25/20= BLE 3/5 BLE    Time 8    Period Weeks    Status Partially Met    Target Date 11/03/20                 Plan - 09/30/20 0843    Clinical Impression Statement Patient is having no pain today and is able to perform strengthening to core muscles and low back. She will continue to benefit from skilled PT to improve strength to  back and continue to have longer pain free periods.    Personal Factors and  Comorbidities Age;Comorbidity 3+;Finances;Fitness;Past/Current Experience;Profession;Time since onset of injury/illness/exacerbation    Comorbidities anxiety, HTN, obesity, PVD, thyroid disease, diverticulitis, HLD, bursitis trochanteric    Examination-Activity Limitations Caring for Others;Dressing;Squat;Stand;Transfers;Other;Bed Mobility;Locomotion Level;Lift;Stairs    Examination-Participation Restrictions Church;Cleaning;Community Activity;Driving;Meal Prep;Laundry;Shop;Yard Work;Other    Stability/Clinical Decision Making Evolving/Moderate complexity    Rehab Potential Fair    PT Frequency 2x / week    PT Duration 8 weeks    PT Treatment/Interventions ADLs/Self Care Home Management;Aquatic Therapy;Biofeedback;Cryotherapy;Electrical Stimulation;Iontophoresis 32m/ml Dexamethasone;Moist Heat;Traction;Ultrasound;Contrast Bath;Therapeutic activities;Therapeutic exercise;Manual techniques;Patient/family education;Manual lymph drainage;Compression bandaging;Passive range of motion;Dry needling;Vasopneumatic Device;Taping;Splinting;Energy conservation;Visual/perceptual remediation/compensation;Joint Manipulations;Gait training;Stair training;Functional mobility training;Neuromuscular re-education;Balance training;Spinal Manipulations    PT Next Visit Plan STM low back, repeated trunk extension, LE lengthening    PT Home Exercise Plan see above    Consulted and Agree with Plan of Care Patient           Patient will benefit from skilled therapeutic intervention in order to improve the following deficits and impairments:  Decreased activity tolerance, Decreased endurance, Decreased mobility, Decreased range of motion, Decreased strength, Hypomobility, Increased edema, Impaired flexibility, Impaired perceived functional ability, Increased muscle spasms, Obesity, Postural dysfunction, Improper body mechanics, Pain, Abnormal gait, Decreased balance, Difficulty walking  Visit Diagnosis: Muscle weakness  (generalized)  Low back pain, unspecified back pain laterality, unspecified chronicity, unspecified whether sciatica present  Abnormal posture     Problem List Patient Active Problem List   Diagnosis Date Noted   Endometrial hyperplasia without atypia, simple 07/21/2015   Diverticulosis 07/21/2015   Neuritis or radiculitis due to rupture of lumbar intervertebral disc 11/12/2014   Lumbar canal stenosis 11/12/2014   Bursitis, trochanteric 11/12/2014   Back ache 04/10/2014   Chronic anemia 04/10/2014   BP (high blood pressure) 04/10/2014   Blood glucose elevated 04/10/2014   HLD (hyperlipidemia) 04/10/2014   Adult hypothyroidism 04/10/2014   Adiposity 04/10/2014   Obstructive apnea 04/10/2014    MAlanson Puls PT DPT 09/30/2020, 8:58 AM  [place] [address]Phone: [phone]  Fax:  [phone] Name: Rebekah TERHAARMRN: [id number] of Birth: [birth year]

## 2020-10-05 ENCOUNTER — Other Ambulatory Visit: Payer: Self-pay

## 2020-10-05 ENCOUNTER — Ambulatory Visit: Payer: 59 | Admitting: Physical Therapy

## 2020-10-05 ENCOUNTER — Ambulatory Visit
Admission: RE | Admit: 2020-10-05 | Discharge: 2020-10-05 | Disposition: A | Discharge status: Home / Self Care | Payer: 59 | Source: Ambulatory Visit | Attending: Internal Medicine | Admitting: Internal Medicine

## 2020-10-05 ENCOUNTER — Encounter: Payer: Self-pay | Admitting: Physical Therapy

## 2020-10-05 DIAGNOSIS — M6281 Muscle weakness (generalized): Secondary | ICD-10-CM | POA: Diagnosis not present

## 2020-10-05 DIAGNOSIS — R293 Abnormal posture: Secondary | ICD-10-CM | POA: Diagnosis not present

## 2020-10-05 DIAGNOSIS — Z1231 Encounter for screening mammogram for malignant neoplasm of breast: Secondary | ICD-10-CM | POA: Diagnosis not present

## 2020-10-05 DIAGNOSIS — M545 Low back pain, unspecified: Secondary | ICD-10-CM

## 2020-10-05 NOTE — Therapy (Signed)
[place] MAIN [facility] SERVICES [address] [place], [place], [address] Phone: [phone]   Fax:  [phone]  Physical Therapy Treatment  Patient Details  Name: Rebekah Perkins MRN: [id number] Date of Birth: 03/10/1956 Referring Provider (PT): Meeler, [place], Carlean Jews    Encounter Date: 10/05/2020   PT End of Session - 10/05/20 0856    Visit Number 15    Number of Visits 16    Date for PT Re-Evaluation 11/03/20    Authorization Type 1/10 eval 8/31    PT Start Time 0845    PT Stop Time 0925    PT Time Calculation (min) 40 min    Equipment Utilized During Treatment Gait belt    Activity Tolerance Patient limited by pain    Behavior During Therapy [facility] for tasks assessed/performed           Past Medical History:  Diagnosis Date  . Anxiety   . Apnea   . Diverticulitis   . Environmental allergies   . Hypertension   . Menopausal state   . Obesity   . PMB (postmenopausal bleeding)   . PVD (peripheral vascular disease) ([place])   . Rosacea   . Simple endometrial hyperplasia   . Thyroid disease   . Urinary urgency     Past Surgical History:  Procedure Laterality Date  . CHOLECYSTECTOMY    . DILATION AND CURETTAGE OF UTERUS    . HYSTEROSCOPY      There were no vitals filed for this visit.   Subjective Assessment - 10/05/20 0854    Subjective Patient has good days and bad days with her back. Usually the AM is better and as the day goes on she has increased pain.    Currently in Pain? No/denies    Pain Score 0-No pain           Treatment: Heel cord stretch x 30 sec x 3 Gastroc/soleus stretch x 30 x 3  Hamstring stretch x 30 sec x 3  Back extension x 10 standing  Nu- step x 5 mins L4  Hip flex stretch x 30 x 3  hooklying hip ER/abd x 20 x 2  hooklying marching x 20 x 2  Trunk rotation left and right x 20 x 2  hooklying clam x 20 x 2  Patient has no LE pain following treatment and 0/10 back pain following  treatment Patient performed with instruction, verbal cues, tactile cues of therapist: goal:increase tissue extensibility, promote proper posture, improve mobility                           PT Education - 10/05/20 0854    Education Details HEP    Person(s) Educated Patient    Methods Explanation    Comprehension Verbalized understanding            PT Short Term Goals - 08/25/20 0946      PT SHORT TERM GOAL #1   Title Patient will be independent in home exercise program to improve strength/mobility for better functional independence with ADLs.    Baseline 8/31: HEP given    Time 4    Period Weeks    Status Achieved    Target Date 08/11/20             PT Long Term Goals - 09/17/20 0807      PT LONG TERM GOAL #1   Title Patient will increase FOTO score to equal  to or greater than  64%   to demonstrate statistically significant improvement in mobility and quality of life.    Baseline 8/31: 51%, 08/25/20=51    Time 8    Period Weeks    Status Partially Met    Target Date 11/03/20      PT LONG TERM GOAL #2   Title Patient will report a worst pain of 3/10 on VAS in lumbar region to improve tolerance with ADLs and reduced symptoms with activities.    Baseline 8/31: 10/10, 08/25/20= 1/10    Time 8    Period Weeks    Status Partially Met    Target Date 11/03/20      PT LONG TERM GOAL #3   Title Patient will reduce modified Oswestry score to <20 as to demonstrate minimal disability with ADLs including improved sleeping tolerance, walking/sitting tolerance etc for better mobility with ADLs    Baseline 8/31: 40%, 08/25/20=34%    Time 8    Period Weeks    Status Partially Met    Target Date 11/03/20      PT LONG TERM GOAL #4   Title Patient will increase BLE gross strength to 4+/5 as to improve functional strength for independent gait, increased standing tolerance and increased ADL ability.    Baseline 8/31: see note, BLE 3/5 hip and knee    Time 8     Status Partially Met    Target Date 11/03/20      PT LONG TERM GOAL #5   Title Patient will increase strength of UE's to 4/5 with minimal pain to improve ability to perform work and home tasks and return to PLOF.    Baseline 7/20: see note 8/25: 4+/5, 08/25/20= BLE 3/5 BLE    Time 8    Period Weeks    Status Partially Met    Target Date 11/03/20                 Plan - 10/05/20 0857    Clinical Impression Statement Pt requires direction and verbal cues for correct performance  strengthening exercises..Patient has no pain behaviors and is able to perform stretches in calfs and hamstring and low back. Patient will be DC PT to HEP..    Personal Factors and Comorbidities Age;Comorbidity 3+;Finances;Fitness;Past/Current Experience;Profession;Time since onset of injury/illness/exacerbation    Comorbidities anxiety, HTN, obesity, PVD, thyroid disease, diverticulitis, HLD, bursitis trochanteric    Examination-Activity Limitations Caring for Others;Dressing;Squat;Stand;Transfers;Other;Bed Mobility;Locomotion Level;Lift;Stairs    Examination-Participation Restrictions Church;Cleaning;Community Activity;Driving;Meal Prep;Laundry;Shop;Yard Work;Other    Stability/Clinical Decision Making Evolving/Moderate complexity    Rehab Potential Fair    PT Frequency 2x / week    PT Duration 8 weeks    PT Treatment/Interventions ADLs/Self Care Home Management;Aquatic Therapy;Biofeedback;Cryotherapy;Electrical Stimulation;Iontophoresis 2m/ml Dexamethasone;Moist Heat;Traction;Ultrasound;Contrast Bath;Therapeutic activities;Therapeutic exercise;Manual techniques;Patient/family education;Manual lymph drainage;Compression bandaging;Passive range of motion;Dry needling;Vasopneumatic Device;Taping;Splinting;Energy conservation;Visual/perceptual remediation/compensation;Joint Manipulations;Gait training;Stair training;Functional mobility training;Neuromuscular re-education;Balance training;Spinal Manipulations     PT Next Visit Plan STM low back, repeated trunk extension, LE lengthening    PT Home Exercise Plan see above    Consulted and Agree with Plan of Care Patient           Patient will benefit from skilled therapeutic intervention in order to improve the following deficits and impairments:  Decreased activity tolerance, Decreased endurance, Decreased mobility, Decreased range of motion, Decreased strength, Hypomobility, Increased edema, Impaired flexibility, Impaired perceived functional ability, Increased muscle spasms, Obesity, Postural dysfunction, Improper body mechanics, Pain, Abnormal gait, Decreased balance, Difficulty walking  Visit Diagnosis:  Low back pain, unspecified back pain laterality, unspecified chronicity, unspecified whether sciatica present  Muscle weakness (generalized)  Abnormal posture     Problem List Patient Active Problem List   Diagnosis Date Noted  . Endometrial hyperplasia without atypia, simple 07/21/2015  . Diverticulosis 07/21/2015  . Neuritis or radiculitis due to rupture of lumbar intervertebral disc 11/12/2014  . Lumbar canal stenosis 11/12/2014  . Bursitis, trochanteric 11/12/2014  . Back ache 04/10/2014  . Chronic anemia 04/10/2014  . BP (high blood pressure) 04/10/2014  . Blood glucose elevated 04/10/2014  . HLD (hyperlipidemia) 04/10/2014  . Adult hypothyroidism 04/10/2014  . Adiposity 04/10/2014  . Obstructive apnea 04/10/2014    Alanson Puls, [place] DPT 10/05/2020, 9:32 AM  [place] MAIN [facility] SERVICES [address] [place], [place], [address] Phone: [phone]   Fax:  [phone]  Name: EMILIE CARP MRN: [id number] Date of Birth: 10/21/1956

## 2020-10-07 ENCOUNTER — Ambulatory Visit: Payer: 59 | Admitting: Physical Therapy

## 2020-11-14 ENCOUNTER — Other Ambulatory Visit: Payer: Self-pay | Admitting: Internal Medicine

## 2020-11-20 ENCOUNTER — Other Ambulatory Visit: Payer: Self-pay | Admitting: Internal Medicine

## 2020-12-01 ENCOUNTER — Other Ambulatory Visit: Payer: Self-pay | Admitting: Physician Assistant

## 2020-12-01 DIAGNOSIS — L309 Dermatitis, unspecified: Secondary | ICD-10-CM | POA: Diagnosis not present

## 2020-12-09 DIAGNOSIS — D485 Neoplasm of uncertain behavior of skin: Secondary | ICD-10-CM | POA: Diagnosis not present

## 2020-12-09 DIAGNOSIS — Z872 Personal history of diseases of the skin and subcutaneous tissue: Secondary | ICD-10-CM | POA: Diagnosis not present

## 2020-12-09 DIAGNOSIS — D225 Melanocytic nevi of trunk: Secondary | ICD-10-CM | POA: Diagnosis not present

## 2020-12-09 DIAGNOSIS — Z86018 Personal history of other benign neoplasm: Secondary | ICD-10-CM | POA: Diagnosis not present

## 2020-12-09 DIAGNOSIS — L2389 Allergic contact dermatitis due to other agents: Secondary | ICD-10-CM | POA: Diagnosis not present

## 2020-12-09 DIAGNOSIS — L578 Other skin changes due to chronic exposure to nonionizing radiation: Secondary | ICD-10-CM | POA: Diagnosis not present

## 2020-12-11 DIAGNOSIS — E782 Mixed hyperlipidemia: Secondary | ICD-10-CM | POA: Diagnosis not present

## 2020-12-11 DIAGNOSIS — E039 Hypothyroidism, unspecified: Secondary | ICD-10-CM | POA: Diagnosis not present

## 2020-12-11 DIAGNOSIS — Z79899 Other long term (current) drug therapy: Secondary | ICD-10-CM | POA: Diagnosis not present

## 2020-12-11 DIAGNOSIS — G4733 Obstructive sleep apnea (adult) (pediatric): Secondary | ICD-10-CM | POA: Diagnosis not present

## 2020-12-11 DIAGNOSIS — I10 Essential (primary) hypertension: Secondary | ICD-10-CM | POA: Diagnosis not present

## 2020-12-11 DIAGNOSIS — R739 Hyperglycemia, unspecified: Secondary | ICD-10-CM | POA: Diagnosis not present

## 2021-01-04 DIAGNOSIS — M5416 Radiculopathy, lumbar region: Secondary | ICD-10-CM | POA: Diagnosis not present

## 2021-01-04 DIAGNOSIS — M5136 Other intervertebral disc degeneration, lumbar region: Secondary | ICD-10-CM | POA: Diagnosis not present

## 2021-01-04 DIAGNOSIS — M1711 Unilateral primary osteoarthritis, right knee: Secondary | ICD-10-CM | POA: Diagnosis not present

## 2021-01-04 DIAGNOSIS — M48062 Spinal stenosis, lumbar region with neurogenic claudication: Secondary | ICD-10-CM | POA: Diagnosis not present

## 2021-01-14 ENCOUNTER — Other Ambulatory Visit: Payer: Self-pay | Admitting: Internal Medicine

## 2021-01-20 ENCOUNTER — Encounter: Payer: Self-pay | Admitting: Gastroenterology

## 2021-01-20 ENCOUNTER — Other Ambulatory Visit: Payer: Self-pay

## 2021-01-20 ENCOUNTER — Ambulatory Visit: Payer: 59 | Admitting: Gastroenterology

## 2021-01-20 VITALS — BP 120/78 | HR 64 | Temp 97.6°F | Ht 63.0 in | Wt 264.2 lb

## 2021-01-20 DIAGNOSIS — K5909 Other constipation: Secondary | ICD-10-CM

## 2021-01-20 DIAGNOSIS — K219 Gastro-esophageal reflux disease without esophagitis: Secondary | ICD-10-CM

## 2021-01-20 MED ORDER — PANTOPRAZOLE SODIUM 40 MG PO TBEC: 40.0000 mg | DELAYED_RELEASE_TABLET | Freq: Two times a day (BID) | ORAL | 2 refills | Status: DC

## 2021-01-20 NOTE — Patient Instructions (Addendum)
Stop the Pepcid and increased the Pantoprazole 40mg  to twice a day    High-Fiber Eating Plan Fiber, also called dietary fiber, is a type of carbohydrate. It is found foods such as fruits, vegetables, whole grains, and beans. A high-fiber diet can have many health benefits. Your health care provider may recommend a high-fiber diet to help:  Prevent constipation. Fiber can make your bowel movements more regular.  Lower your cholesterol.  Relieve the following conditions: ? Inflammation of veins in the anus (hemorrhoids). ? Inflammation of specific areas of the digestive tract (uncomplicated diverticulosis). ? A problem of the large intestine, also called the colon, that sometimes causes pain and diarrhea (irritable bowel syndrome, or IBS).  Prevent overeating as part of a weight-loss plan.  Prevent heart disease, type 2 diabetes, and certain cancers. What are tips for following this plan? Reading food labels  Check the nutrition facts label on food products for the amount of dietary fiber. Choose foods that have 5 grams of fiber or more per serving.  The goals for recommended daily fiber intake include: ? Men (age 53 or younger): 34-38 g. ? Men (over age 66): 28-34 g. ? Women (age 61 or younger): 25-28 g. ? Women (over age 29): 22-25 g. Your daily fiber goal is _____________ g.   Shopping  Choose whole fruits and vegetables instead of processed forms, such as apple juice or applesauce.  Choose a wide variety of high-fiber foods such as avocados, lentils, oats, and kidney beans.  Read the nutrition facts label of the foods you choose. Be aware of foods with added fiber. These foods often have high sugar and sodium amounts per serving. Cooking  Use whole-grain flour for baking and cooking.  Cook with brown rice instead of white rice. Meal planning  Start the day with a breakfast that is high in fiber, such as a cereal that contains 5 g of fiber or more per serving.  Eat  breads and cereals that are made with whole-grain flour instead of refined flour or white flour.  Eat brown rice, bulgur wheat, or millet instead of white rice.  Use beans in place of meat in soups, salads, and pasta dishes.  Be sure that half of the grains you eat each day are whole grains. General information  You can get the recommended daily intake of dietary fiber by: ? Eating a variety of fruits, vegetables, grains, nuts, and beans. ? Taking a fiber supplement if you are not able to take in enough fiber in your diet. It is better to get fiber through food than from a supplement.  Gradually increase how much fiber you consume. If you increase your intake of dietary fiber too quickly, you may have bloating, cramping, or gas.  Drink plenty of water to help you digest fiber.  Choose high-fiber snacks, such as berries, raw vegetables, nuts, and popcorn. What foods should I eat? Fruits Berries. Pears. Apples. Oranges. Avocado. Prunes and raisins. Dried figs. Vegetables Sweet potatoes. Spinach. Kale. Artichokes. Cabbage. Broccoli. Cauliflower. Green peas. Carrots. Squash. Grains Whole-grain breads. Multigrain cereal. Oats and oatmeal. Brown rice. Barley. Bulgur wheat. [place]. Quinoa. Bran muffins. Popcorn. Rye wafer crackers. Meats and other proteins Navy beans, kidney beans, and pinto beans. Soybeans. Split peas. Lentils. Nuts and seeds. Dairy Fiber-fortified yogurt. Beverages Fiber-fortified soy milk. Fiber-fortified orange juice. Other foods Fiber bars. The items listed above may not be a complete list of recommended foods and beverages. Contact a dietitian for more information. What foods should I  avoid? Fruits Fruit juice. Cooked, strained fruit. Vegetables Fried potatoes. Canned vegetables. Well-cooked vegetables. Grains White bread. Pasta made with refined flour. White rice. Meats and other proteins Fatty cuts of meat. Fried chicken or fried fish. Dairy Milk. Yogurt.  Cream cheese. Sour cream. Fats and oils Butters. Beverages Soft drinks. Other foods Cakes and pastries. The items listed above may not be a complete list of foods and beverages to avoid. Talk with your dietitian about what choices are best for you. Summary  Fiber is a type of carbohydrate. It is found in foods such as fruits, vegetables, whole grains, and beans.  A high-fiber diet has many benefits. It can help to prevent constipation, lower blood cholesterol, aid weight loss, and reduce your risk of heart disease, diabetes, and certain cancers.  Increase your intake of fiber gradually. Increasing fiber too quickly may cause cramping, bloating, and gas. Drink plenty of water while you increase the amount of fiber you consume.  The best sources of fiber include whole fruits and vegetables, whole grains, nuts, seeds, and beans. This information is not intended to replace advice given to you by your health care provider. Make sure you discuss any questions you have with your health care provider. Document Revised: 03/05/2020 Document Reviewed: 03/05/2020 Elsevier Patient Education  2021 [place].  Gastroesophageal Reflux Disease, Adult  Gastroesophageal reflux (GER) happens when acid from the stomach flows up into the tube that connects the mouth and the stomach (esophagus). Normally, food travels down the esophagus and stays in the stomach to be digested. With GER, food and stomach acid sometimes move back up into the esophagus. You may have a disease called gastroesophageal reflux disease (GERD) if the reflux:  Happens often.  Causes frequent or very bad symptoms.  Causes problems such as damage to the esophagus. When this happens, the esophagus becomes sore and swollen. Over time, GERD can make small holes (ulcers) in the lining of the esophagus. What are the causes? This condition is caused by a problem with the muscle between the esophagus and the stomach. When this muscle is  weak or not normal, it does not close properly to keep food and acid from coming back up from the stomach. The muscle can be weak because of:  Tobacco use.  Pregnancy.  Having a certain type of hernia (hiatal hernia).  Alcohol use.  Certain foods and drinks, such as coffee, chocolate, onions, and peppermint. What increases the risk?  Being overweight.  Having a disease that affects your connective tissue.  Taking NSAIDs, such a ibuprofen. What are the signs or symptoms?  Heartburn.  Difficult or painful swallowing.  The feeling of having a lump in the throat.  A bitter taste in the mouth.  Bad breath.  Having a lot of saliva.  Having an upset or bloated stomach.  Burping.  Chest pain. Different conditions can cause chest pain. Make sure you see your doctor if you have chest pain.  Shortness of breath or wheezing.  A long-term cough or a cough at night.  Wearing away of the surface of teeth (tooth enamel).  Weight loss. How is this treated?  Making changes to your diet.  Taking medicine.  Having surgery. Treatment will depend on how bad your symptoms are. Follow these instructions at home: Eating and drinking  Follow a diet as told by your doctor. You may need to avoid foods and drinks such as: ? Coffee and tea, with or without caffeine. ? Drinks that contain alcohol. ?  Energy drinks and sports drinks. ? Bubbly (carbonated) drinks or sodas. ? Chocolate and cocoa. ? Peppermint and mint flavorings. ? Garlic and onions. ? Horseradish. ? Spicy and acidic foods. These include peppers, chili powder, curry powder, vinegar, hot sauces, and BBQ sauce. ? Citrus fruit juices and citrus fruits, such as oranges, lemons, and limes. ? Tomato-based foods. These include red sauce, chili, salsa, and pizza with red sauce. ? Fried and fatty foods. These include donuts, french fries, potato chips, and high-fat dressings. ? High-fat meats. These include hot dogs, rib  eye steak, sausage, ham, and bacon. ? High-fat dairy items, such as whole milk, butter, and cream cheese.  Eat small meals often. Avoid eating large meals.  Avoid drinking large amounts of liquid with your meals.  Avoid eating meals during the 2-3 hours before bedtime.  Avoid lying down right after you eat.  Do not exercise right after you eat.   Lifestyle  Do not smoke or use any products that contain nicotine or tobacco. If you need help quitting, ask your doctor.  Try to lower your stress. If you need help doing this, ask your doctor.  If you are overweight, lose an amount of weight that is healthy for you. Ask your doctor about a safe weight loss goal.   General instructions  Pay attention to any changes in your symptoms.  Take over-the-counter and prescription medicines only as told by your doctor.  Do not take aspirin, ibuprofen, or other NSAIDs unless your doctor says it is okay.  Wear loose clothes. Do not wear anything tight around your waist.  Raise (elevate) the head of your bed about 6 inches (15 cm). You may need to use a wedge to do this.  Avoid bending over if this makes your symptoms worse.  Keep all follow-up visits. Contact a doctor if:  You have new symptoms.  You lose weight and you do not know why.  You have trouble swallowing or it hurts to swallow.  You have wheezing or a cough that keeps happening.  You have a hoarse voice.  Your symptoms do not get better with treatment. Get help right away if:  You have sudden pain in your arms, neck, jaw, teeth, or back.  You suddenly feel sweaty, dizzy, or light-headed.  You have chest pain or shortness of breath.  You vomit and the vomit is green, yellow, or black, or it looks like blood or coffee grounds.  You faint.  Your poop (stool) is red, bloody, or black.  You cannot swallow, drink, or eat. These symptoms may represent a serious problem that is an emergency. Do not wait to see if the  symptoms will go away. Get medical help right away. Call your local emergency services (911 in the [place] Do not drive yourself to the hospital. Summary  If a person has gastroesophageal reflux disease (GERD), food and stomach acid move back up into the esophagus and cause symptoms or problems such as damage to the esophagus.  Treatment will depend on how bad your symptoms are.  Follow a diet as told by your doctor.  Take all medicines only as told by your doctor. This information is not intended to replace advice given to you by your health care provider. Make sure you discuss any questions you have with your health care provider. Document Revised: 05/11/2020 Document Reviewed: 05/11/2020 Elsevier Patient Education  [place].

## 2021-01-20 NOTE — Progress Notes (Signed)
Rebekah Darby, MD [address]  [place]  [place], [place] [address]  Main: [phone]  Fax: [phone]    Gastroenterology Consultation  Referring Provider:     Idelle Crouch, MD Primary Care Physician:  Idelle Crouch, MD Primary Gastroenterologist:  Dr. Cephas Perkins Reason for Consultation:     Chronic GERD, chronic constipation        HPI:   Rebekah Perkins is a 65 y.o. female referred by Dr. Doy Hutching, Leonie Douglas, MD  for consultation & management of chronic GERD.  Patient reports that she has been having heartburn for a long time, maintained on Protonix 40 mg daily.  For last 2 months, she has been experiencing worsening of her heartburn, taking Pepcid at night which is not relieving her symptoms.  She also reports chronic constipation, does not take any medication.  Her diet is devoid of fiber, does not drink adequate water.  She reports that bread and sweetened tea are her weaknesses.  Patient is also taking iron pill daily.  Her labs revealed normal hemoglobin, LFTs  She does not smoke or drink alcohol  NSAIDs: None  Antiplts/Anticoagulants/Anti thrombotics: None  GI Procedures: Reports having had a colonoscopy several years ago, she was told by her PCP that she is not due yet  Past Medical History:  Diagnosis Date  . Anxiety   . Apnea   . Diverticulitis   . Environmental allergies   . Hypertension   . Menopausal state   . Obesity   . PMB (postmenopausal bleeding)   . PVD (peripheral vascular disease) ([place])   . Rosacea   . Simple endometrial hyperplasia   . Thyroid disease   . Urinary urgency     Past Surgical History:  Procedure Laterality Date  . CHOLECYSTECTOMY    . DILATION AND CURETTAGE OF UTERUS    . HYSTEROSCOPY      Current Outpatient Medications:  .  acetaminophen (TYLENOL) 500 MG tablet, Take 1,500 mg by mouth every 6 (six) hours as needed for mild pain or headache., Disp: , Rfl:  .  aspirin EC 81 MG tablet, Take 81 mg by  mouth daily., Disp: , Rfl:  .  bisoprolol-hydrochlorothiazide (ZIAC) 5-6.25 MG per tablet, Take 1 tablet by mouth daily., Disp: , Rfl:  .  calcium carbonate (OS-CAL) 600 MG TABS tablet, Take 600 mg by mouth at bedtime., Disp: , Rfl:  .  celecoxib (CELEBREX) 200 MG capsule, Take 1 capsule by mouth 2 (two) times daily., Disp: , Rfl:  .  cetirizine (ZYRTEC) 10 MG tablet, Take 10 mg by mouth at bedtime., Disp: , Rfl:  .  docusate sodium (COLACE) 100 MG capsule, Take 100 mg by mouth 2 (two) times daily., Disp: , Rfl:  .  ferrous sulfate 325 (65 FE) MG tablet, Take 325 mg by mouth at bedtime., Disp: , Rfl:  .  fluticasone (FLONASE) 50 MCG/ACT nasal spray, Place 2 sprays into both nostrils daily., Disp: , Rfl:  .  gabapentin (NEURONTIN) 300 MG capsule, Take 300 mg by mouth at bedtime., Disp: , Rfl:  .  hydrochlorothiazide (HYDRODIURIL) 25 MG tablet, Take 25 mg by mouth daily., Disp: , Rfl:  .  isosorbide mononitrate (IMDUR) 30 MG 24 hr tablet, Take 30 mg by mouth daily., Disp: , Rfl:  .  levothyroxine (SYNTHROID, LEVOTHROID) 150 MCG tablet, TAKE 1 TABLET BY MOUTH ONCE DAILY. TAKE ON AN EMPTY STOMACH WITH A GLASS OF WATER AT LEAST 30-60 MINUTES BEFORE BREAKFAST.,  Disp: , Rfl:  .  medroxyPROGESTERone (PROVERA) 10 MG tablet, Take 1 tablet (10 mg total) by mouth daily. Days 1 through 10, Disp: 10 tablet, Rfl: 11 .  Methylcobalamin 1 MG CHEW, Chew 1 tablet by mouth daily., Disp: , Rfl:  .  phentermine (ADIPEX-P) 37.5 MG tablet, Take 37.5 mg by mouth daily., Disp: , Rfl:  .  Potassium 99 MG TABS, Take by mouth in the morning and at bedtime., Disp: , Rfl:  .  traZODone (DESYREL) 50 MG tablet, , Disp: , Rfl: 11 .  vitamin B-12 (CYANOCOBALAMIN) 1000 MCG tablet, Take 1,000 mcg by mouth daily., Disp: , Rfl:  .  pantoprazole (PROTONIX) 40 MG tablet, Take 1 tablet (40 mg total) by mouth 2 (two) times daily before a meal., Disp: 60 tablet, Rfl: 2 .  traZODone (DESYREL) 50 MG tablet, Take by mouth., Disp: , Rfl:     Family History  Problem Relation Age of Onset  . Cancer Maternal Grandmother   . Colon cancer Maternal Grandmother   . Breast cancer Maternal Grandmother 80  . Diabetes Mother   . Heart disease Mother   . Diabetes Sister   . Breast cancer Sister   . Cancer Sister   . Breast cancer Maternal Aunt 60  . Lung cancer Maternal Aunt   . Ovarian cancer Neg Hx      Social History   Tobacco Use  . Smoking status: Passive Smoke Exposure - Never Smoker  . Smokeless tobacco: Never Used  Vaping Use  . Vaping Use: Never used  Substance Use Topics  . Alcohol use: No  . Drug use: No    Allergies as of 01/20/2021 - Review Complete 01/20/2021  Allergen Reaction Noted  . Azithromycin Itching 07/17/2015  . Penicillins Itching 07/17/2015    Review of Systems:    All systems reviewed and negative except where noted in HPI.   Physical Exam:  BP 120/78 (BP Location: Left Arm, Patient Position: Sitting, Cuff Size: Large)   Pulse 64   Temp 97.6 F (36.4 C) (Oral)   Ht 5\' 3"  (1.6 m)   Wt 264 lb 4 oz (119.9 kg)   BMI 46.81 kg/m  No LMP recorded. Patient is postmenopausal.  General:   Alert,  Well-developed, well-nourished, pleasant and cooperative in NAD Head:  Normocephalic and atraumatic. Eyes:  Sclera clear, no icterus.   Conjunctiva pink. Ears:  Normal auditory acuity. Nose:  No deformity, discharge, or lesions. Mouth:  No deformity or lesions,oropharynx pink & moist. Neck:  Supple; no masses or thyromegaly. Lungs:  Respirations even and unlabored.  Clear throughout to auscultation.   No wheezes, crackles, or rhonchi. No acute distress. Heart:  Regular rate and rhythm; no murmurs, clicks, rubs, or gallops. Abdomen:  Normal bowel sounds. Soft, non-tender and non-distended without masses, hepatosplenomegaly or hernias noted.  No guarding or rebound tenderness.   Rectal: Not performed Msk:  Symmetrical without gross deformities. Good, equal movement & strength  bilaterally. Pulses:  Normal pulses noted. Extremities:  No clubbing or edema.  No cyanosis. Neurologic:  Alert and oriented x3;  grossly normal neurologically. Skin:  Intact without significant lesions or rashes. No jaundice. Psych:  Alert and cooperative. Normal mood and affect.  Imaging Studies: Reviewed  Assessment and Plan:   Rebekah Perkins is a 65 y.o. pleasant Caucasian female with obesity, BMI 46.8, hypertension, hypothyroidism is seen in consultation for flareup of chronic GERD as well as constipation  Flareup of chronic GERD Highly reiterated on antireflux  lifestyle, information provided Increase Protonix to 40 mg twice daily and stop Pepcid Recommend EGD for further evaluation and to screen for Barrett's esophagus  Chronic constipation Reiterated on high-fiber diet, adequate intake of water Trial of fiber supplements, information provided   Follow up in 3 months   Rebekah Darby, MD

## 2021-01-26 ENCOUNTER — Other Ambulatory Visit: Payer: Self-pay

## 2021-01-26 ENCOUNTER — Ambulatory Visit (INDEPENDENT_AMBULATORY_CARE_PROVIDER_SITE_OTHER): Payer: 59 | Admitting: Obstetrics and Gynecology

## 2021-01-26 ENCOUNTER — Encounter: Payer: Self-pay | Admitting: Obstetrics and Gynecology

## 2021-01-26 VITALS — BP 110/68 | HR 65 | Ht 63.0 in | Wt 266.3 lb

## 2021-01-26 DIAGNOSIS — Z01419 Encounter for gynecological examination (general) (routine) without abnormal findings: Secondary | ICD-10-CM | POA: Diagnosis not present

## 2021-01-26 DIAGNOSIS — N8189 Other female genital prolapse: Secondary | ICD-10-CM | POA: Diagnosis not present

## 2021-01-26 DIAGNOSIS — Z6841 Body Mass Index (BMI) 40.0 and over, adult: Secondary | ICD-10-CM

## 2021-01-26 NOTE — Progress Notes (Signed)
HPI:      [name] is a 65 y.o. [phone] who LMP was No LMP recorded. Patient is postmenopausal.  Subjective:   She presents today for her annual examination.  She has no complaints.  She has stopped taking the Provera as directed and did not bleed upon withdrawal.     The following portions of the patient's history were reviewed and updated as appropriate:             She  has a past medical history of Anxiety, Apnea, Diverticulitis, Environmental allergies, Hypertension, Menopausal state, Obesity, PMB (postmenopausal bleeding), PVD (peripheral vascular disease) ([place]), Rosacea, Simple endometrial hyperplasia, Thyroid disease, and Urinary urgency. She does not have any pertinent problems on file. She  has a past surgical history that includes Dilation and curettage of uterus; Hysteroscopy; and Cholecystectomy. Her family history includes Breast cancer in her sister; Breast cancer (age of onset: 87) in her maternal aunt; Breast cancer (age of onset: 33) in her maternal grandmother; Cancer in her maternal grandmother and sister; Colon cancer in her maternal grandmother; Diabetes in her mother and sister; Heart disease in her mother; Lung cancer in her maternal aunt. She  reports that she is a non-smoker but has been exposed to tobacco smoke. She has never used smokeless tobacco. She reports that she does not drink alcohol and does not use drugs. She has a current medication list which includes the following prescription(s): acetaminophen, aspirin ec, bisoprolol-hydrochlorothiazide, calcium carbonate, celecoxib, cetirizine, docusate sodium, fluticasone, gabapentin, hydrochlorothiazide, isosorbide mononitrate, levothyroxine, methylcobalamin, pantoprazole, phentermine, potassium, trazodone, vitamin b-12, ferrous sulfate, medroxyprogesterone, and trazodone. She is allergic to azithromycin and penicillins.       Review of Systems:  Review of Systems  Constitutional: Denied constitutional  symptoms, night sweats, recent illness, fatigue, fever, insomnia and weight loss.  Eyes: Denied eye symptoms, eye pain, photophobia, vision change and visual disturbance.  Ears/Nose/Throat/Neck: Denied ear, nose, throat or neck symptoms, hearing loss, nasal discharge, sinus congestion and sore throat.  Cardiovascular: Denied cardiovascular symptoms, arrhythmia, chest pain/pressure, edema, exercise intolerance, orthopnea and palpitations.  Respiratory: Denied pulmonary symptoms, asthma, pleuritic pain, productive sputum, cough, dyspnea and wheezing.  Gastrointestinal: Denied, gastro-esophageal reflux, melena, nausea and vomiting.  Genitourinary: Denied genitourinary symptoms including symptomatic vaginal discharge, pelvic relaxation issues, and urinary complaints.  Musculoskeletal: Denied musculoskeletal symptoms, stiffness, swelling, muscle weakness and myalgia.  Dermatologic: Denied dermatology symptoms, rash and scar.  Neurologic: Denied neurology symptoms, dizziness, headache, neck pain and syncope.  Psychiatric: Denied psychiatric symptoms, anxiety and depression.  Endocrine: Denied endocrine symptoms including hot flashes and night sweats.   Meds:   Current Outpatient Medications on File Prior to Visit  Medication Sig Dispense Refill  . acetaminophen (TYLENOL) 500 MG tablet Take 1,500 mg by mouth every 6 (six) hours as needed for mild pain or headache.    Marland Kitchen aspirin EC 81 MG tablet Take 81 mg by mouth daily.    . bisoprolol-hydrochlorothiazide (ZIAC) 5-6.25 MG per tablet Take 1 tablet by mouth daily.    . calcium carbonate (OS-CAL) 600 MG TABS tablet Take 600 mg by mouth at bedtime.    . celecoxib (CELEBREX) 200 MG capsule Take 1 capsule by mouth 2 (two) times daily.    . cetirizine (ZYRTEC) 10 MG tablet Take 10 mg by mouth at bedtime.    . docusate sodium (COLACE) 100 MG capsule Take 100 mg by mouth 2 (two) times daily.    . fluticasone (FLONASE) 50 MCG/ACT nasal spray Place 2 sprays  into both nostrils daily.    Marland Kitchen gabapentin (NEURONTIN) 300 MG capsule Take 300 mg by mouth at bedtime.    . hydrochlorothiazide (HYDRODIURIL) 25 MG tablet Take 25 mg by mouth daily.    . isosorbide mononitrate (IMDUR) 30 MG 24 hr tablet Take 30 mg by mouth daily.    Marland Kitchen levothyroxine (SYNTHROID, LEVOTHROID) 150 MCG tablet TAKE 1 TABLET BY MOUTH ONCE DAILY. TAKE ON AN EMPTY STOMACH WITH A GLASS OF WATER AT LEAST 30-60 MINUTES BEFORE BREAKFAST.    Marland Kitchen Methylcobalamin 1 MG CHEW Chew 1 tablet by mouth daily.    . pantoprazole (PROTONIX) 40 MG tablet Take 1 tablet (40 mg total) by mouth 2 (two) times daily before a meal. 60 tablet 2  . phentermine (ADIPEX-P) 37.5 MG tablet Take 37.5 mg by mouth daily.    . Potassium 99 MG TABS Take by mouth in the morning and at bedtime.    . traZODone (DESYREL) 50 MG tablet   11  . vitamin B-12 (CYANOCOBALAMIN) 1000 MCG tablet Take 1,000 mcg by mouth daily.    . ferrous sulfate 325 (65 FE) MG tablet Take 325 mg by mouth at bedtime. (Patient not taking: Reported on 01/26/2021)    . medroxyPROGESTERone (PROVERA) 10 MG tablet Take 1 tablet (10 mg total) by mouth daily. Days 1 through 10 (Patient not taking: Reported on 01/26/2021) 10 tablet 11  . traZODone (DESYREL) 50 MG tablet Take by mouth.     No current facility-administered medications on file prior to visit.          Objective:     Vitals:   01/26/21 0902  BP: 110/68  Pulse: 65    Filed Weights   01/26/21 0902  Weight: 266 lb 4.8 oz (120.8 kg)              Physical examination General NAD, Conversant  HEENT Atraumatic; Op clear with mmm.  Normo-cephalic. Pupils reactive. Anicteric sclerae  Thyroid/Neck Smooth without nodularity or enlargement. Normal ROM.  Neck Supple.  Skin No rashes, lesions or ulceration. Normal palpated skin turgor. No nodularity.  Breasts: No masses or discharge.  Symmetric.  No axillary adenopathy.  Lungs: Clear to auscultation.No rales or wheezes. Normal Respiratory effort,  no retractions.  Heart: NSR.  No murmurs or rubs appreciated. No periferal edema  Abdomen: Soft.  Non-tender.  No masses.  No HSM. No hernia  Extremities: Moves all appropriately.  Normal ROM for age. No lymphadenopathy.  Neuro: Oriented to PPT.  Normal mood. Normal affect.     Pelvic:   Vulva: Normal appearance.  No lesions.  Vagina: No lesions or abnormalities noted.  Support: Normal pelvic support.  Complete loss of perineal body with large posterior relaxation.  Second-degree cystocele as well.    Urethra No masses tenderness or scarring.  Meatus Normal size without lesions or prolapse.  Cervix: Normal appearance.  No lesions.  Anus: Normal exam.  No lesions.  Perineum: Normal exam.  No lesions.        Bimanual   Uterus: Normal size.  Non-tender.  Mobile.  AV.  Adnexae: No masses.  Non-tender to palpation.  Cul-de-sac: Negative for abnormality.   Exam limited by patient body habitus   Assessment:    [id number] Patient Active Problem List   Diagnosis Date Noted  . BMI 45.0-49.9, adult ([place]) 12/03/2019  . Endometrial hyperplasia without atypia, simple 07/21/2015  . Diverticulosis 07/21/2015  . Neuritis or radiculitis due to rupture of lumbar intervertebral disc 11/12/2014  .  Lumbar canal stenosis 11/12/2014  . Bursitis, trochanteric 11/12/2014  . Back ache 04/10/2014  . Chronic anemia 04/10/2014  . BP (high blood pressure) 04/10/2014  . Blood glucose elevated 04/10/2014  . HLD (hyperlipidemia) 04/10/2014  . Adult hypothyroidism 04/10/2014  . Adiposity 04/10/2014  . Obstructive apnea 04/10/2014     1. Well woman exam with routine gynecological exam   2. Morbid obesity with BMI of 45.0-49.9, adult ([facility])   3. Pelvic relaxation disorder     Patient asymptomatic with pelvic relaxation.   Plan:            1.  Basic Screening Recommendations The basic screening recommendations for asymptomatic women were discussed with the patient during her visit.  The age-appropriate  recommendations were discussed with her and the rational for the tests reviewed.  When I am informed by the patient that another primary care physician has previously obtained the age-appropriate tests and they are up-to-date, only outstanding tests are ordered and referrals given as necessary.  Abnormal results of tests will be discussed with her when all of her results are completed.  Routine preventative health maintenance measures emphasized: Exercise/Diet/Weight control, Tobacco Warnings, Alcohol/Substance use risks and Stress Management PCP orders blood work and mammography.  Patient has aged out of Pap smears. Orders No orders of the defined types were placed in this encounter.   No orders of the defined types were placed in this encounter.         F/U  Return in about 1 year (around 01/26/2022) for Annual Physical.  Finis Bud, M.D. 01/26/2021 9:49 AM

## 2021-02-01 ENCOUNTER — Other Ambulatory Visit
Admission: RE | Admit: 2021-02-01 | Discharge: 2021-02-01 | Disposition: A | Discharge status: Home / Self Care | Payer: 59 | Source: Ambulatory Visit | Attending: Gastroenterology | Admitting: Gastroenterology

## 2021-02-01 ENCOUNTER — Other Ambulatory Visit: Payer: Self-pay

## 2021-02-01 DIAGNOSIS — Z20822 Contact with and (suspected) exposure to covid-19: Secondary | ICD-10-CM | POA: Diagnosis not present

## 2021-02-01 DIAGNOSIS — Z01812 Encounter for preprocedural laboratory examination: Secondary | ICD-10-CM | POA: Insufficient documentation

## 2021-02-01 LAB — SARS CORONAVIRUS 2 (TAT 6-24 HRS): SARS Coronavirus 2: NEGATIVE

## 2021-02-02 ENCOUNTER — Encounter: Payer: Self-pay | Admitting: Gastroenterology

## 2021-02-03 ENCOUNTER — Ambulatory Visit: Payer: 59 | Admitting: Certified Registered"

## 2021-02-03 ENCOUNTER — Ambulatory Visit
Admission: RE | Admit: 2021-02-03 | Discharge: 2021-02-03 | Disposition: A | Discharge status: Home / Self Care | Payer: 59 | Source: Ambulatory Visit | Attending: Gastroenterology | Admitting: Gastroenterology

## 2021-02-03 ENCOUNTER — Encounter
Admission: RE | Disposition: A | Discharge status: Home / Self Care | Payer: Self-pay | Source: Ambulatory Visit | Attending: Gastroenterology

## 2021-02-03 ENCOUNTER — Encounter: Payer: Self-pay | Admitting: Gastroenterology

## 2021-02-03 DIAGNOSIS — K259 Gastric ulcer, unspecified as acute or chronic, without hemorrhage or perforation: Secondary | ICD-10-CM | POA: Diagnosis not present

## 2021-02-03 DIAGNOSIS — Z800 Family history of malignant neoplasm of digestive organs: Secondary | ICD-10-CM | POA: Diagnosis not present

## 2021-02-03 DIAGNOSIS — Z833 Family history of diabetes mellitus: Secondary | ICD-10-CM | POA: Diagnosis not present

## 2021-02-03 DIAGNOSIS — Z801 Family history of malignant neoplasm of trachea, bronchus and lung: Secondary | ICD-10-CM | POA: Diagnosis not present

## 2021-02-03 DIAGNOSIS — K319 Disease of stomach and duodenum, unspecified: Secondary | ICD-10-CM | POA: Insufficient documentation

## 2021-02-03 DIAGNOSIS — Z7689 Persons encountering health services in other specified circumstances: Secondary | ICD-10-CM | POA: Diagnosis not present

## 2021-02-03 DIAGNOSIS — Z881 Allergy status to other antibiotic agents status: Secondary | ICD-10-CM | POA: Insufficient documentation

## 2021-02-03 DIAGNOSIS — Z809 Family history of malignant neoplasm, unspecified: Secondary | ICD-10-CM | POA: Insufficient documentation

## 2021-02-03 DIAGNOSIS — K219 Gastro-esophageal reflux disease without esophagitis: Secondary | ICD-10-CM | POA: Insufficient documentation

## 2021-02-03 DIAGNOSIS — K254 Chronic or unspecified gastric ulcer with hemorrhage: Secondary | ICD-10-CM | POA: Insufficient documentation

## 2021-02-03 DIAGNOSIS — Z803 Family history of malignant neoplasm of breast: Secondary | ICD-10-CM | POA: Insufficient documentation

## 2021-02-03 DIAGNOSIS — Z7982 Long term (current) use of aspirin: Secondary | ICD-10-CM | POA: Diagnosis not present

## 2021-02-03 DIAGNOSIS — K2950 Unspecified chronic gastritis without bleeding: Secondary | ICD-10-CM | POA: Diagnosis not present

## 2021-02-03 DIAGNOSIS — E039 Hypothyroidism, unspecified: Secondary | ICD-10-CM | POA: Diagnosis not present

## 2021-02-03 DIAGNOSIS — Z79899 Other long term (current) drug therapy: Secondary | ICD-10-CM | POA: Diagnosis not present

## 2021-02-03 DIAGNOSIS — Z8249 Family history of ischemic heart disease and other diseases of the circulatory system: Secondary | ICD-10-CM | POA: Diagnosis not present

## 2021-02-03 DIAGNOSIS — Z880 Allergy status to penicillin: Secondary | ICD-10-CM | POA: Insufficient documentation

## 2021-02-03 DIAGNOSIS — I10 Essential (primary) hypertension: Secondary | ICD-10-CM | POA: Diagnosis not present

## 2021-02-03 DIAGNOSIS — E785 Hyperlipidemia, unspecified: Secondary | ICD-10-CM | POA: Diagnosis not present

## 2021-02-03 HISTORY — DX: Other complications of anesthesia, initial encounter: T88.59XA

## 2021-02-03 HISTORY — DX: Sleep apnea, unspecified: G47.30

## 2021-02-03 HISTORY — PX: ESOPHAGOGASTRODUODENOSCOPY (EGD) WITH PROPOFOL: SHX5813

## 2021-02-03 SURGERY — ESOPHAGOGASTRODUODENOSCOPY (EGD) WITH PROPOFOL
Anesthesia: General

## 2021-02-03 MED ORDER — SODIUM CHLORIDE 0.9 % IV SOLN: INTRAVENOUS | Status: DC

## 2021-02-03 MED ORDER — PROPOFOL 10 MG/ML IV BOLUS
INTRAVENOUS | Status: DC | PRN
  Administered 2021-02-03: 20 mg via INTRAVENOUS
  Administered 2021-02-03: 30 mg via INTRAVENOUS
  Administered 2021-02-03 (×4): 20 mg via INTRAVENOUS

## 2021-02-03 MED ORDER — LIDOCAINE HCL (CARDIAC) PF 100 MG/5ML IV SOSY
PREFILLED_SYRINGE | INTRAVENOUS | Status: DC | PRN
  Administered 2021-02-03: 40 mg via INTRAVENOUS

## 2021-02-03 MED ORDER — PROPOFOL 500 MG/50ML IV EMUL
INTRAVENOUS | Status: DC | PRN
  Administered 2021-02-03: 145 ug/kg/min via INTRAVENOUS

## 2021-02-03 MED ORDER — GLYCOPYRROLATE 0.2 MG/ML IJ SOLN
INTRAMUSCULAR | Status: DC | PRN
  Administered 2021-02-03: .2 mg via INTRAVENOUS

## 2021-02-03 NOTE — Anesthesia Postprocedure Evaluation (Signed)
Anesthesia Post Note  Patient: Rebekah Perkins  Procedure(s) Performed: ESOPHAGOGASTRODUODENOSCOPY (EGD) WITH PROPOFOL (N/A )  Patient location during evaluation: Endoscopy Anesthesia Type: General Level of consciousness: awake and alert Pain management: pain level controlled Vital Signs Assessment: post-procedure vital signs reviewed and stable Respiratory status: spontaneous breathing, nonlabored ventilation, respiratory function stable and patient connected to nasal cannula oxygen Cardiovascular status: blood pressure returned to baseline and stable Postop Assessment: no apparent nausea or vomiting Anesthetic complications: no   No complications documented.   Last Vitals:  Vitals:   02/03/21 1115 02/03/21 1125  BP: 122/62 (!) 94/59  Pulse: 69 67  Resp: 17   Temp: (!) 36.1 C   SpO2: 100% 97%    Last Pain:  Vitals:   02/03/21 1125  TempSrc:   PainSc: Asleep                 Arita Miss

## 2021-02-03 NOTE — Op Note (Signed)
[facility] Gastroenterology Patient Name: Rebekah Perkins Procedure Date: 02/03/2021 10:49 AM MRN: [id number] Account #: [id number] Date of Birth: [birth year] Admit Type: Outpatient Age: 65 Room: [facility] ENDO ROOM 4 Gender: Female Note Status: Finalized Procedure:             Upper GI endoscopy Indications:           Screening for Barrett's esophagus, Follow-up of                         gastro-esophageal reflux disease Providers:             Lin Landsman MD, MD Referring MD:          Leonie Douglas. Doy Hutching, MD (Referring MD) Medicines:             General Anesthesia Complications:         No immediate complications. Estimated blood loss: None. Procedure:             Pre-Anesthesia Assessment:                        - Prior to the procedure, a History and Physical was                         performed, and patient medications and allergies were                         reviewed. The patient is competent. The risks and                         benefits of the procedure and the sedation options and                         risks were discussed with the patient. All questions                         were answered and informed consent was obtained.                         Patient identification and proposed procedure were                         verified by the physician, the nurse, the                         anesthesiologist, the anesthetist and the technician                         in the pre-procedure area in the procedure room in the                         endoscopy suite. Mental Status Examination: alert and                         oriented. Airway Examination: normal oropharyngeal                         airway and neck mobility. Respiratory Examination:  clear to auscultation. CV Examination: normal.                         Prophylactic Antibiotics: The patient does not require                         prophylactic antibiotics. Prior  Anticoagulants: The                         patient has taken no previous anticoagulant or                         antiplatelet agents. ASA Grade Assessment: III - A                         patient with severe systemic disease. After reviewing                         the risks and benefits, the patient was deemed in                         satisfactory condition to undergo the procedure. The                         anesthesia plan was to use general anesthesia.                         Immediately prior to administration of medications,                         the patient was re-assessed for adequacy to receive                         sedatives. The heart rate, respiratory rate, oxygen                         saturations, blood pressure, adequacy of pulmonary                         ventilation, and response to care were monitored                         throughout the procedure. The physical status of the                         patient was re-assessed after the procedure.                        After obtaining informed consent, the endoscope was                         passed under direct vision. Throughout the procedure,                         the patient's blood pressure, pulse, and oxygen                         saturations were monitored continuously. The Endoscope  was introduced through the mouth, and advanced to the                         second part of duodenum. The upper GI endoscopy was                         accomplished without difficulty. The patient tolerated                         the procedure well. Findings:      The duodenal bulb and second portion of the duodenum were normal.      Multiple dispersed small erosions with stigmata of recent bleeding were       found in the gastric antrum. Biopsies were taken with a cold forceps for       Helicobacter pylori testing.      The gastric body was normal. Biopsies were taken with a cold forceps for        Helicobacter pylori testing.      The cardia and gastric fundus were normal on retroflexion.      Esophagogastric landmarks were identified: the gastroesophageal junction       was found at 40 cm from the incisors.      The gastroesophageal junction and examined esophagus were normal. Impression:            - Normal duodenal bulb and second portion of the                         duodenum.                        - Erosive gastropathy with stigmata of recent                         bleeding. Biopsied.                        - Normal gastric body. Biopsied.                        - Esophagogastric landmarks identified.                        - Normal gastroesophageal junction and esophagus. Recommendation:        - Discharge patient to home (with escort).                        - Resume previous diet today.                        - Continue present medications.                        - Await pathology results. Procedure Code(s):     --- Professional ---                        [phone], Esophagogastroduodenoscopy, flexible,                         transoral; with biopsy, single or multiple Diagnosis Code(s):     --- Professional ---  K92.2, Gastrointestinal hemorrhage, unspecified                        Z13.810, Encounter for screening for upper                         gastrointestinal disorder                        K21.9, Gastro-esophageal reflux disease without                         esophagitis CPT copyright 2019 [employer]. All rights reserved. The codes documented in this report are preliminary and upon coder review may  be revised to meet current compliance requirements. Dr. Ulyess Mort Lin Landsman MD, MD 02/03/2021 11:13:24 AM This report has been signed electronically. Number of Addenda: 0 Note Initiated On: 02/03/2021 10:49 AM Estimated Blood Loss:  Estimated blood loss: none.      [facility]

## 2021-02-03 NOTE — H&P (Signed)
Cephas Darby, MD [address]  [place]  [place], [place] [address]  Main: [phone]  Fax: [phone] Pager: [phone]  Primary Care Physician:  Idelle Crouch, MD Primary Gastroenterologist:  Dr. Cephas Darby  Pre-Procedure History & Physical: HPI:  Rebekah Perkins is a 65 y.o. female is here for an endoscopy.   Past Medical History:  Diagnosis Date  . Anxiety   . Apnea   . Complication of anesthesia    slow to wake up afterwards  . Diverticulitis   . Environmental allergies   . Hypertension   . Menopausal state   . Obesity   . PMB (postmenopausal bleeding)   . PVD (peripheral vascular disease) ([facility])   . Rosacea   . Simple endometrial hyperplasia   . Sleep apnea   . Thyroid disease   . Urinary urgency     Past Surgical History:  Procedure Laterality Date  . CHOLECYSTECTOMY    . DILATION AND CURETTAGE OF UTERUS    . HYSTEROSCOPY      Prior to Admission medications   Medication Sig Start Date End Date Taking? Authorizing Provider  aspirin EC 81 MG tablet Take 81 mg by mouth daily.   Yes [provider]  bisoprolol-hydrochlorothiazide (ZIAC) 5-6.25 MG per tablet Take 1 tablet by mouth daily.   Yes [provider]  calcium carbonate (OS-CAL) 600 MG TABS tablet Take 600 mg by mouth at bedtime.   Yes [provider]  isosorbide mononitrate (IMDUR) 30 MG 24 hr tablet Take 30 mg by mouth daily. 11/13/20  Yes [provider]  vitamin B-12 (CYANOCOBALAMIN) 1000 MCG tablet Take 1,000 mcg by mouth daily.   Yes [provider]  acetaminophen (TYLENOL) 500 MG tablet Take 1,500 mg by mouth every 6 (six) hours as needed for mild pain or headache.    [provider]  celecoxib (CELEBREX) 200 MG capsule Take 1 capsule by mouth 2 (two) times daily. 11/20/20   [provider]  cetirizine (ZYRTEC) 10 MG tablet Take 10 mg by mouth at bedtime.    [provider]  docusate sodium (COLACE) 100 MG  capsule Take 100 mg by mouth 2 (two) times daily.    [provider]  ferrous sulfate 325 (65 FE) MG tablet Take 325 mg by mouth at bedtime. Patient not taking: Reported on 01/26/2021    [provider]  fluticasone (FLONASE) 50 MCG/ACT nasal spray Place 2 sprays into both nostrils daily.    [provider]  gabapentin (NEURONTIN) 300 MG capsule Take 300 mg by mouth at bedtime.    [provider]  hydrochlorothiazide (HYDRODIURIL) 25 MG tablet Take 25 mg by mouth daily.    [provider]  levothyroxine (SYNTHROID, LEVOTHROID) 150 MCG tablet TAKE 1 TABLET BY MOUTH ONCE DAILY. TAKE ON AN EMPTY STOMACH WITH A GLASS OF WATER AT LEAST 30-60 MINUTES BEFORE BREAKFAST. 05/09/17   [provider]  medroxyPROGESTERone (PROVERA) 10 MG tablet Take 1 tablet (10 mg total) by mouth daily. Days 1 through 10 Patient not taking: Reported on 01/26/2021 01/16/19   Harlin Heys, MD  Methylcobalamin 1 MG CHEW Chew 1 tablet by mouth daily.    [provider]  pantoprazole (PROTONIX) 40 MG tablet Take 1 tablet (40 mg total) by mouth 2 (two) times daily before a meal. 01/20/21 03/21/21  Zuleica Seith, Tally Due, MD  phentermine (ADIPEX-P) 37.5 MG tablet Take 37.5 mg by mouth daily.    [provider]  Potassium 99 MG TABS Take by mouth in the morning and at bedtime.    [provider]  traZODone (DESYREL) 50 MG tablet Take by mouth. 04/19/16 01/11/18  [provider]  traZODone (DESYREL) 50 MG tablet  04/03/18   [provider]    Allergies as of 01/20/2021 - Review Complete 01/20/2021  Allergen Reaction Noted  . Azithromycin Itching 07/17/2015  . Penicillins Itching 07/17/2015    Family History  Problem Relation Age of Onset  . Cancer Maternal Grandmother   . Colon cancer Maternal Grandmother   . Breast cancer Maternal Grandmother 80  . Diabetes Mother   . Heart disease Mother   . Diabetes Sister   . Breast cancer Sister    . Cancer Sister   . Breast cancer Maternal Aunt 60  . Lung cancer Maternal Aunt   . Ovarian cancer Neg Hx     Social History   Socioeconomic History  . Marital status: Single    Spouse name: Not on file  . Number of children: Not on file  . Years of education: Not on file  . Highest education level: Not on file  Occupational History  . Not on file  Tobacco Use  . Smoking status: Passive Smoke Exposure - Never Smoker  . Smokeless tobacco: Never Used  Vaping Use  . Vaping Use: Never used  Substance and Sexual Activity  . Alcohol use: No  . Drug use: No  . Sexual activity: Never  Other Topics Concern  . Not on file  Social History Narrative  . Not on file   Social Determinants of Health   Financial Resource Strain: Not on file  Food Insecurity: Not on file  Transportation Needs: Not on file  Physical Activity: Not on file  Stress: Not on file  Social Connections: Not on file  Intimate Partner Violence: Not on file    Review of Systems: See HPI, otherwise negative ROS  Physical Exam: BP 109/62   Pulse 64   Temp (!) 97 F (36.1 C) (Temporal)   Resp 17   Ht 5\' 3"  (1.6 m)   Wt 122.5 kg   SpO2 100%   BMI 47.83 kg/m  General:   Alert,  pleasant and cooperative in NAD Head:  Normocephalic and atraumatic. Neck:  Supple; no masses or thyromegaly. Lungs:  Clear throughout to auscultation.    Heart:  Regular rate and rhythm. Abdomen:  Soft, nontender and nondistended. Normal bowel sounds, without guarding, and without rebound.   Neurologic:  Alert and  oriented x4;  grossly normal neurologically.  Impression/Plan: Rebekah Perkins is here for an endoscopy to be performed for chronic gerd  Risks, benefits, limitations, and alternatives regarding  endoscopy have been reviewed with the patient.  Questions have been answered.  All parties agreeable.   Sherri Sear, MD  02/03/2021, 10:52 AM

## 2021-02-03 NOTE — Anesthesia Procedure Notes (Signed)
Procedure Name: General with mask airway Performed by: Fletcher-Harrison, Antionio Negron, CRNA Pre-anesthesia Checklist: Patient identified, Emergency Drugs available, Suction available and Patient being monitored Patient Re-evaluated:Patient Re-evaluated prior to induction Oxygen Delivery Method: Simple face mask Induction Type: IV induction Placement Confirmation: positive ETCO2 and CO2 detector Dental Injury: Teeth and Oropharynx as per pre-operative assessment        

## 2021-02-03 NOTE — Anesthesia Preprocedure Evaluation (Signed)
Anesthesia Evaluation  Patient identified by MRN, date of birth, ID band Patient awake    Reviewed: Allergy & Precautions, NPO status , Patient's Chart, lab work & pertinent test results  History of Anesthesia Complications (+) PROLONGED EMERGENCE and history of anesthetic complications  Airway Mallampati: III  TM Distance: >3 FB Neck ROM: Full    Dental no notable dental hx. (+) Teeth Intact   Pulmonary sleep apnea , neg COPD, Patient abstained from smoking.Not current smoker,    Pulmonary exam normal breath sounds clear to auscultation       Cardiovascular Exercise Tolerance: Poor METShypertension, + Peripheral Vascular Disease  (-) CAD and (-) Past MI (-) dysrhythmias  Rhythm:Regular Rate:Normal - Systolic murmurs Unremarkable echo 2018   Neuro/Psych PSYCHIATRIC DISORDERS Anxiety negative neurological ROS     GI/Hepatic neg GERD  ,(+)     (-) substance abuse  ,   Endo/Other  neg diabetesHypothyroidism Morbid obesity  Renal/GU negative Renal ROS     Musculoskeletal   Abdominal (+) + obese,   Peds  Hematology   Anesthesia Other Findings Past Medical History: No date: Anxiety No date: Apnea No date: Complication of anesthesia     Comment:  slow to wake up afterwards No date: Diverticulitis No date: Environmental allergies No date: Hypertension No date: Menopausal state No date: Obesity No date: PMB (postmenopausal bleeding) No date: PVD (peripheral vascular disease) ([facility]) No date: Rosacea No date: Simple endometrial hyperplasia No date: Sleep apnea No date: Thyroid disease No date: Urinary urgency  Reproductive/Obstetrics                             Anesthesia Physical Anesthesia Plan  ASA: III  Anesthesia Plan: General   Post-op Pain Management:    Induction: Intravenous  PONV Risk Score and Plan: 3 and Ondansetron, Propofol infusion and TIVA  Airway Management  Planned: Nasal Cannula  Additional Equipment: None  Intra-op Plan:   Post-operative Plan:   Informed Consent: I have reviewed the patients History and Physical, chart, labs and discussed the procedure including the risks, benefits and alternatives for the proposed anesthesia with the patient or authorized representative who has indicated his/her understanding and acceptance.     Dental advisory given  Plan Discussed with: CRNA and Surgeon  Anesthesia Plan Comments: (Discussed risks of anesthesia with patient, including possibility of difficulty with spontaneous ventilation under anesthesia necessitating airway intervention, PONV, and rare risks such as cardiac or respiratory or neurological events. Patient understands. Patient informed about increased incidence of above perioperative risk due to high BMI. Patient understands. )        Anesthesia Quick Evaluation

## 2021-02-03 NOTE — Transfer of Care (Signed)
Immediate Anesthesia Transfer of Care Note  Patient: Rebekah Perkins  Procedure(s) Performed: ESOPHAGOGASTRODUODENOSCOPY (EGD) WITH PROPOFOL (N/A )  Patient Location: Endoscopy Unit  Anesthesia Type:General  Level of Consciousness: drowsy and patient cooperative  Airway & Oxygen Therapy: Patient Spontanous Breathing and Patient connected to face mask oxygen  Post-op Assessment: Report given to RN and Post -op Vital signs reviewed and stable  Post vital signs: Reviewed and stable  Last Vitals:  Vitals Value Taken Time  BP 122/62 02/03/21 1119  Temp 36.1 C 02/03/21 1115  Pulse 69 02/03/21 1122  Resp 20 02/03/21 1122  SpO2 100 % 02/03/21 1122  Vitals shown include unvalidated device data.  Last Pain:  Vitals:   02/03/21 1115  TempSrc:   PainSc: Asleep         Complications: No complications documented.

## 2021-02-05 ENCOUNTER — Encounter: Payer: Self-pay | Admitting: Gastroenterology

## 2021-02-05 LAB — SURGICAL PATHOLOGY

## 2021-02-08 ENCOUNTER — Telehealth: Payer: Self-pay

## 2021-02-08 DIAGNOSIS — K2970 Gastritis, unspecified, without bleeding: Secondary | ICD-10-CM

## 2021-02-08 DIAGNOSIS — K219 Gastro-esophageal reflux disease without esophagitis: Secondary | ICD-10-CM

## 2021-02-08 NOTE — Telephone Encounter (Signed)
Order the lab test. Patient read mychart message and left patient a detail message to go get lab work done

## 2021-02-08 NOTE — Telephone Encounter (Signed)
-----   Message from Lin Landsman, MD sent at 02/05/2021  2:14 PM EDT ----- The pathology results from upper endoscopy shows active inflammation in the stomach.  Therefore, recommend to check H. pylori IgG levels and treat if positive  RV

## 2021-02-10 DIAGNOSIS — K2970 Gastritis, unspecified, without bleeding: Secondary | ICD-10-CM | POA: Diagnosis not present

## 2021-02-10 DIAGNOSIS — K219 Gastro-esophageal reflux disease without esophagitis: Secondary | ICD-10-CM | POA: Diagnosis not present

## 2021-02-11 LAB — H. PYLORI ANTIBODY, IGG: H. pylori, IgG AbS: 0.37 Index Value (ref 0.00–0.79)

## 2021-02-15 ENCOUNTER — Other Ambulatory Visit: Payer: Self-pay | Admitting: Internal Medicine

## 2021-02-15 ENCOUNTER — Other Ambulatory Visit: Payer: Self-pay

## 2021-02-15 DIAGNOSIS — G4733 Obstructive sleep apnea (adult) (pediatric): Secondary | ICD-10-CM | POA: Diagnosis not present

## 2021-02-15 DIAGNOSIS — Z9989 Dependence on other enabling machines and devices: Secondary | ICD-10-CM | POA: Diagnosis not present

## 2021-02-15 MED ORDER — CELECOXIB 200 MG PO CAPS
ORAL_CAPSULE | Freq: Two times a day (BID) | ORAL | 2 refills | Status: DC
  Filled 2021-02-15: qty 60, 30d supply, fill #0
  Filled 2021-03-25: qty 60, 30d supply, fill #1
  Filled 2021-04-30: qty 60, 30d supply, fill #2

## 2021-02-15 MED FILL — Trazodone HCl Tab 50 MG: ORAL | 30 days supply | Qty: 30 | Fill #0 | Status: CN

## 2021-02-15 MED FILL — Isosorbide Mononitrate Tab ER 24HR 30 MG: ORAL | 90 days supply | Qty: 90 | Fill #0 | Status: AC

## 2021-02-15 MED FILL — Phentermine HCl Tab 37.5 MG: ORAL | 90 days supply | Qty: 90 | Fill #0 | Status: AC

## 2021-02-16 ENCOUNTER — Other Ambulatory Visit: Payer: Self-pay

## 2021-02-20 DIAGNOSIS — G4733 Obstructive sleep apnea (adult) (pediatric): Secondary | ICD-10-CM | POA: Diagnosis not present

## 2021-02-22 ENCOUNTER — Other Ambulatory Visit: Payer: Self-pay

## 2021-03-05 ENCOUNTER — Other Ambulatory Visit: Payer: Self-pay

## 2021-03-05 MED ORDER — BISOPROLOL-HYDROCHLOROTHIAZIDE 5-6.25 MG PO TABS
1.0000 | ORAL_TABLET | Freq: Every day | ORAL | 1 refills | Status: DC
  Filled 2021-03-05: qty 90, 90d supply, fill #0

## 2021-03-05 MED ORDER — FLUTICASONE PROPIONATE 50 MCG/ACT NA SUSP: NASAL | 11 refills | Status: DC

## 2021-03-05 MED ORDER — GABAPENTIN 300 MG PO CAPS
ORAL_CAPSULE | ORAL | 1 refills | Status: DC
  Filled 2021-03-05: qty 90, 90d supply, fill #0

## 2021-03-05 MED ORDER — LEVOTHYROXINE SODIUM 150 MCG PO TABS
ORAL_TABLET | ORAL | 1 refills | Status: DC
  Filled 2021-03-05: qty 30, 30d supply, fill #0

## 2021-03-05 MED ORDER — PHENTERMINE HCL 37.5 MG PO TABS
ORAL_TABLET | ORAL | 1 refills | Status: DC
  Filled 2021-03-05 – 2021-04-30 (×2): qty 90, 90d supply, fill #0

## 2021-03-05 MED ORDER — FLUTICASONE PROPIONATE 50 MCG/ACT NA SUSP
2.0000 | Freq: Every day | NASAL | 9 refills | Status: DC
  Filled 2021-03-05: qty 16, 30d supply, fill #0
  Filled 2021-03-25: qty 16, 30d supply, fill #1
  Filled 2021-04-30: qty 16, 30d supply, fill #2

## 2021-03-26 ENCOUNTER — Other Ambulatory Visit: Payer: Self-pay

## 2021-03-26 DIAGNOSIS — E039 Hypothyroidism, unspecified: Secondary | ICD-10-CM | POA: Diagnosis not present

## 2021-03-26 DIAGNOSIS — E538 Deficiency of other specified B group vitamins: Secondary | ICD-10-CM | POA: Diagnosis not present

## 2021-03-26 DIAGNOSIS — M19072 Primary osteoarthritis, left ankle and foot: Secondary | ICD-10-CM | POA: Diagnosis not present

## 2021-03-26 DIAGNOSIS — R739 Hyperglycemia, unspecified: Secondary | ICD-10-CM | POA: Diagnosis not present

## 2021-03-26 DIAGNOSIS — G4733 Obstructive sleep apnea (adult) (pediatric): Secondary | ICD-10-CM | POA: Diagnosis not present

## 2021-03-26 DIAGNOSIS — Z6841 Body Mass Index (BMI) 40.0 and over, adult: Secondary | ICD-10-CM | POA: Diagnosis not present

## 2021-03-26 DIAGNOSIS — M79672 Pain in left foot: Secondary | ICD-10-CM | POA: Diagnosis not present

## 2021-03-26 DIAGNOSIS — I10 Essential (primary) hypertension: Secondary | ICD-10-CM | POA: Diagnosis not present

## 2021-03-26 DIAGNOSIS — E782 Mixed hyperlipidemia: Secondary | ICD-10-CM | POA: Diagnosis not present

## 2021-03-26 DIAGNOSIS — Z79899 Other long term (current) drug therapy: Secondary | ICD-10-CM | POA: Diagnosis not present

## 2021-03-26 DIAGNOSIS — R8290 Unspecified abnormal findings in urine: Secondary | ICD-10-CM | POA: Diagnosis not present

## 2021-03-26 MED FILL — Trazodone HCl Tab 50 MG: ORAL | 90 days supply | Qty: 90 | Fill #0 | Status: AC

## 2021-03-29 ENCOUNTER — Other Ambulatory Visit: Payer: Self-pay

## 2021-03-31 ENCOUNTER — Other Ambulatory Visit: Payer: Self-pay

## 2021-03-31 ENCOUNTER — Other Ambulatory Visit: Payer: Self-pay | Admitting: Internal Medicine

## 2021-03-31 MED ORDER — HYDROCHLOROTHIAZIDE 25 MG PO TABS
ORAL_TABLET | Freq: Every day | ORAL | 2 refills | Status: DC
  Filled 2021-03-31: qty 90, 90d supply, fill #0
  Filled 2021-06-14 – 2021-06-23 (×2): qty 90, 90d supply, fill #1

## 2021-03-31 MED FILL — Bisoprolol & Hydrochlorothiazide Tab 5-6.25 MG: ORAL | 90 days supply | Qty: 90 | Fill #0 | Status: AC

## 2021-03-31 MED FILL — Gabapentin Cap 300 MG: ORAL | 90 days supply | Qty: 90 | Fill #0 | Status: AC

## 2021-03-31 MED FILL — Pantoprazole Sodium EC Tab 40 MG (Base Equiv): ORAL | 90 days supply | Qty: 180 | Fill #0 | Status: AC

## 2021-03-31 MED FILL — Levothyroxine Sodium Tab 150 MCG: ORAL | 90 days supply | Qty: 90 | Fill #0 | Status: AC

## 2021-04-01 ENCOUNTER — Other Ambulatory Visit: Payer: Self-pay

## 2021-04-05 ENCOUNTER — Other Ambulatory Visit: Payer: Self-pay

## 2021-04-08 ENCOUNTER — Other Ambulatory Visit: Payer: Self-pay

## 2021-04-19 ENCOUNTER — Other Ambulatory Visit: Payer: Self-pay

## 2021-04-22 ENCOUNTER — Other Ambulatory Visit: Payer: Self-pay

## 2021-04-22 ENCOUNTER — Encounter: Payer: Self-pay | Admitting: Gastroenterology

## 2021-04-22 ENCOUNTER — Ambulatory Visit: Payer: 59 | Admitting: Gastroenterology

## 2021-04-22 VITALS — BP 111/73 | HR 62 | Temp 97.5°F | Ht 63.0 in | Wt 265.0 lb

## 2021-04-22 DIAGNOSIS — Z1211 Encounter for screening for malignant neoplasm of colon: Secondary | ICD-10-CM | POA: Diagnosis not present

## 2021-04-22 DIAGNOSIS — K5909 Other constipation: Secondary | ICD-10-CM | POA: Diagnosis not present

## 2021-04-22 MED ORDER — NA SULFATE-K SULFATE-MG SULF 17.5-3.13-1.6 GM/177ML PO SOLN
354.0000 mL | Freq: Once | ORAL | 0 refills | Status: AC
  Filled 2021-04-22: qty 354, 1d supply, fill #0

## 2021-04-22 MED ORDER — MAGNESIUM CITRATE PO SOLN
1.0000 | Freq: Once | ORAL | 0 refills | Status: AC
  Filled 2021-04-22: qty 195, fill #0

## 2021-04-22 NOTE — Patient Instructions (Signed)
High-Fiber Eating Plan Fiber, also called dietary fiber, is a type of carbohydrate. It is found foods such as fruits, vegetables, whole grains, and beans. A high-fiber diet can have many health benefits. Your health care provider may recommend a high-fiber diet to help:  Prevent constipation. Fiber can make your bowel movements more regular.  Lower your cholesterol.  Relieve the following conditions: ? Inflammation of veins in the anus (hemorrhoids). ? Inflammation of specific areas of the digestive tract (uncomplicated diverticulosis). ? A problem of the large intestine, also called the colon, that sometimes causes pain and diarrhea (irritable bowel syndrome, or IBS).  Prevent overeating as part of a weight-loss plan.  Prevent heart disease, type 2 diabetes, and certain cancers. What are tips for following this plan? Reading food labels  Check the nutrition facts label on food products for the amount of dietary fiber. Choose foods that have 5 grams of fiber or more per serving.  The goals for recommended daily fiber intake include: ? Men (age 50 or younger): 34-38 g. ? Men (over age 50): 28-34 g. ? Women (age 50 or younger): 25-28 g. ? Women (over age 50): 22-25 g. Your daily fiber goal is _____________ g.   Shopping  Choose whole fruits and vegetables instead of processed forms, such as apple juice or applesauce.  Choose a wide variety of high-fiber foods such as avocados, lentils, oats, and kidney beans.  Read the nutrition facts label of the foods you choose. Be aware of foods with added fiber. These foods often have high sugar and sodium amounts per serving. Cooking  Use whole-grain flour for baking and cooking.  Cook with brown rice instead of white rice. Meal planning  Start the day with a breakfast that is high in fiber, such as a cereal that contains 5 g of fiber or more per serving.  Eat breads and cereals that are made with whole-grain flour instead of refined  flour or white flour.  Eat brown rice, bulgur wheat, or millet instead of white rice.  Use beans in place of meat in soups, salads, and pasta dishes.  Be sure that half of the grains you eat each day are whole grains. General information  You can get the recommended daily intake of dietary fiber by: ? Eating a variety of fruits, vegetables, grains, nuts, and beans. ? Taking a fiber supplement if you are not able to take in enough fiber in your diet. It is better to get fiber through food than from a supplement.  Gradually increase how much fiber you consume. If you increase your intake of dietary fiber too quickly, you may have bloating, cramping, or gas.  Drink plenty of water to help you digest fiber.  Choose high-fiber snacks, such as berries, raw vegetables, nuts, and popcorn. What foods should I eat? Fruits Berries. Pears. Apples. Oranges. Avocado. Prunes and raisins. Dried figs. Vegetables Sweet potatoes. Spinach. Kale. Artichokes. Cabbage. Broccoli. Cauliflower. Green peas. Carrots. Squash. Grains Whole-grain breads. Multigrain cereal. Oats and oatmeal. Brown rice. Barley. Bulgur wheat. Millet. Quinoa. Bran muffins. Popcorn. Rye wafer crackers. Meats and other proteins Navy beans, kidney beans, and pinto beans. Soybeans. Split peas. Lentils. Nuts and seeds. Dairy Fiber-fortified yogurt. Beverages Fiber-fortified soy milk. Fiber-fortified orange juice. Other foods Fiber bars. The items listed above may not be a complete list of recommended foods and beverages. Contact a dietitian for more information. What foods should I avoid? Fruits Fruit juice. Cooked, strained fruit. Vegetables Fried potatoes. Canned vegetables. Well-cooked vegetables. Grains   White bread. Pasta made with refined flour. White rice. Meats and other proteins Fatty cuts of meat. Fried chicken or fried fish. Dairy Milk. Yogurt. Cream cheese. Sour cream. Fats and oils Butters. Beverages Soft  drinks. Other foods Cakes and pastries. The items listed above may not be a complete list of foods and beverages to avoid. Talk with your dietitian about what choices are best for you. Summary  Fiber is a type of carbohydrate. It is found in foods such as fruits, vegetables, whole grains, and beans.  A high-fiber diet has many benefits. It can help to prevent constipation, lower blood cholesterol, aid weight loss, and reduce your risk of heart disease, diabetes, and certain cancers.  Increase your intake of fiber gradually. Increasing fiber too quickly may cause cramping, bloating, and gas. Drink plenty of water while you increase the amount of fiber you consume.  The best sources of fiber include whole fruits and vegetables, whole grains, nuts, seeds, and beans. This information is not intended to replace advice given to you by your health care provider. Make sure you discuss any questions you have with your health care provider. Document Revised: 03/05/2020 Document Reviewed: 03/05/2020 Elsevier Patient Education  2021 Elsevier Inc.  

## 2021-04-22 NOTE — Progress Notes (Signed)
Cephas Darby, MD [address]  [place]  [place], [place] [address]  Main: [phone]  Fax: [phone]    Gastroenterology Consultation  Referring Provider:     Idelle Crouch, MD Primary Care Physician:  Idelle Crouch, MD Primary Gastroenterologist:  Dr. Cephas Darby Reason for Consultation:     Chronic GERD, chronic constipation        HPI:   Rebekah Perkins is a 65 y.o. female referred by Dr. Doy Hutching, Leonie Douglas, MD  for consultation & management of chronic GERD.  Patient reports that she has been having heartburn for a long time, maintained on Protonix 40 mg daily.  For last 2 months, she has been experiencing worsening of her heartburn, taking Pepcid at night which is not relieving her symptoms.  She also reports chronic constipation, does not take any medication.  Her diet is devoid of fiber, does not drink adequate water.  She reports that bread and sweetened tea are her weaknesses.  Patient is also taking iron pill daily.  Her labs revealed normal hemoglobin, LFTs  She does not smoke or drink alcohol  Follow-up visit 04/22/2021 Patient is here to discuss about worsening of constipation.  She reports that she had not had a bowel movement in 3 days till today and has had 2 BMs today and she feels a lot better.  She is concerned about abdominal bloating and low back pain.  She is also here to discuss about colonoscopy for colon cancer screening.  She denies any rectal bleeding.  She has not tried any stool softeners.  She added more fiber in her diet along with increased intake of water since last visit  NSAIDs: None  Antiplts/Anticoagulants/Anti thrombotics: None  GI Procedures: Reports having had a colonoscopy several years ago, she was told by her PCP that she is not due yet  Past Medical History:  Diagnosis Date   Anxiety    Apnea    Complication of anesthesia    slow to wake up afterwards   Diverticulitis    Environmental allergies    Hypertension     Menopausal state    Obesity    PMB (postmenopausal bleeding)    PVD (peripheral vascular disease) ([facility])    Rosacea    Simple endometrial hyperplasia    Sleep apnea    Thyroid disease    Urinary urgency     Past Surgical History:  Procedure Laterality Date   CHOLECYSTECTOMY     DILATION AND CURETTAGE OF UTERUS     ESOPHAGOGASTRODUODENOSCOPY (EGD) WITH PROPOFOL N/A 02/03/2021   Procedure: ESOPHAGOGASTRODUODENOSCOPY (EGD) WITH PROPOFOL;  Surgeon: Lin Landsman, MD;  Location: [facility] ENDOSCOPY;  Service: Gastroenterology;  Laterality: N/A;   HYSTEROSCOPY      Current Outpatient Medications:    aspirin EC 81 MG tablet, Take 81 mg by mouth daily., Disp: , Rfl:    bisoprolol-hydrochlorothiazide (ZIAC) 5-6.25 MG tablet, TAKE 1 TABLET BY MOUTH ONCE DAILY, Disp: 90 tablet, Rfl: 1   calcium carbonate (OS-CAL) 600 MG TABS tablet, Take 600 mg by mouth at bedtime., Disp: , Rfl:    celecoxib (CELEBREX) 200 MG capsule, TAKE 1 CAPSULE BY MOUTH TWICE DAILY, Disp: 60 capsule, Rfl: 2   cetirizine (ZYRTEC) 10 MG tablet, Take 10 mg by mouth at bedtime., Disp: , Rfl:    docusate sodium (COLACE) 100 MG capsule, Take 100 mg by mouth 2 (two) times daily., Disp: , Rfl:    famotidine (PEPCID) 40 MG  tablet, TAKE 1 TABLET (40 MG TOTAL) BY MOUTH NIGHTLY, Disp: 90 tablet, Rfl: 3   fluticasone (FLONASE) 50 MCG/ACT nasal spray, Place 2 sprays into both nostrils daily., Disp: 16 g, Rfl: 9   fluticasone (FLONASE) 50 MCG/ACT nasal spray, Place 2 sprays into both nostrils once daily, Disp: 16 g, Rfl: 11   gabapentin (NEURONTIN) 300 MG capsule, TAKE 1 CAPSULE BY MOUTH NIGHTLY, Disp: 90 capsule, Rfl: 1   hydrochlorothiazide (HYDRODIURIL) 25 MG tablet, TAKE 1 TABLET BY MOUTH ONCE DAILY, Disp: 90 tablet, Rfl: 2   isosorbide mononitrate (IMDUR) 30 MG 24 hr tablet, TAKE 1 TABLET BY MOUTH DAILY, Disp: 90 tablet, Rfl: 3   levothyroxine (SYNTHROID) 150 MCG tablet, TAKE 1 TABLET BY MOUTH ON AN EMPTY STOMACH WITH A GLASS OF  WATER AT LEAST 30-60 MINUTES BEFORE BREAKFAST ONCE DAILY, Disp: 90 tablet, Rfl: 1   magnesium citrate SOLN, Take 296 mLs (1 Bottle total) by mouth once for 1 dose., Disp: 195 mL, Rfl: 0   Methylcobalamin 1 MG CHEW, Chew 1 tablet by mouth daily., Disp: , Rfl:    Na Sulfate-K Sulfate-Mg Sulf 17.5-3.13-1.6 GM/177ML SOLN, Take 354 mLs by mouth once for 1 dose., Disp: 354 mL, Rfl: 0   pantoprazole (PROTONIX) 40 MG tablet, TAKE 1 TABLET BY MOUTH TWICE DAILY BEFORE A MEAL., Disp: 60 tablet, Rfl: 2   phentermine (ADIPEX-P) 37.5 MG tablet, Take 1 tablet (37.5 mg total) by mouth once daily, Disp: 90 tablet, Rfl: 1   Potassium 99 MG TABS, Take by mouth in the morning and at bedtime., Disp: , Rfl:    traZODone (DESYREL) 50 MG tablet, TAKE 1 TABLET (50 MG TOTAL) BY MOUTH NIGHTLY, Disp: 90 tablet, Rfl: 3   vitamin B-12 (CYANOCOBALAMIN) 1000 MCG tablet, Take 1,000 mcg by mouth daily., Disp: , Rfl:    Family History  Problem Relation Age of Onset   Cancer Maternal Grandmother    Colon cancer Maternal Grandmother    Breast cancer Maternal Grandmother 38   Diabetes Mother    Heart disease Mother    Diabetes Sister    Breast cancer Sister    Cancer Sister    Breast cancer Maternal Aunt 60   Lung cancer Maternal Aunt    Ovarian cancer Neg Hx      Social History   Tobacco Use   Smoking status: Passive Smoke Exposure - Never Smoker   Smokeless tobacco: Never  Vaping Use   Vaping Use: Never used  Substance Use Topics   Alcohol use: No   Drug use: No    Allergies as of 04/22/2021 - Review Complete 04/22/2021  Allergen Reaction Noted   Azithromycin Itching 07/17/2015   Penicillins Itching 07/17/2015    Review of Systems:    All systems reviewed and negative except where noted in HPI.   Physical Exam:  BP 111/73 (BP Location: Left Arm, Patient Position: Sitting, Cuff Size: Large)   Pulse 62   Temp (!) 97.5 F (36.4 C) (Oral)   Ht 5\' 3"  (1.6 m)   Wt 265 lb (120.2 kg)   BMI 46.94 kg/m  No  LMP recorded. Patient is postmenopausal.  General:   Alert,  Well-developed, well-nourished, pleasant and cooperative in NAD Head:  Normocephalic and atraumatic. Eyes:  Sclera clear, no icterus.   Conjunctiva pink. Ears:  Normal auditory acuity. Nose:  No deformity, discharge, or lesions. Mouth:  No deformity or lesions,oropharynx pink & moist. Neck:  Supple; no masses or thyromegaly. Lungs:  Respirations even and  unlabored.  Clear throughout to auscultation.   No wheezes, crackles, or rhonchi. No acute distress. Heart:  Regular rate and rhythm; no murmurs, clicks, rubs, or gallops. Abdomen:  Normal bowel sounds. Soft, diffusely distended, tympanic to percussion, nontender without masses, hepatosplenomegaly or hernias noted.  No guarding or rebound tenderness.   Rectal: Not performed Msk:  Symmetrical without gross deformities. Good, equal movement & strength bilaterally. Pulses:  Normal pulses noted. Extremities:  No clubbing or edema.  No cyanosis. Neurologic:  Alert and oriented x3;  grossly normal neurologically. Skin:  Intact without significant lesions or rashes. No jaundice. Psych:  Alert and cooperative. Normal mood and affect.  Imaging Studies: Reviewed  Assessment and Plan:   Rebekah Perkins is a 65 y.o. pleasant Caucasian female with obesity, BMI 46.8, hypertension, hypothyroidism, chronic GERD and chronic constipation  Chronic constipation Reiterated on high-fiber diet, adequate intake of water Trial of MiraLAX, samples provided  Colon cancer screening Schedule screening colonoscopy with 2-day prep   Follow up as needed   Cephas Darby, MD

## 2021-04-30 ENCOUNTER — Emergency Department
Admission: EM | Admit: 2021-04-30 | Discharge: 2021-04-30 | Disposition: A | Discharge status: Home / Self Care | Payer: 59 | Attending: Student in an Organized Health Care Education/Training Program | Admitting: Student in an Organized Health Care Education/Training Program

## 2021-04-30 ENCOUNTER — Other Ambulatory Visit: Payer: Self-pay

## 2021-04-30 ENCOUNTER — Encounter: Payer: Self-pay | Admitting: Emergency Medicine

## 2021-04-30 ENCOUNTER — Emergency Department: Payer: 59

## 2021-04-30 DIAGNOSIS — Z79899 Other long term (current) drug therapy: Secondary | ICD-10-CM | POA: Insufficient documentation

## 2021-04-30 DIAGNOSIS — Z7722 Contact with and (suspected) exposure to environmental tobacco smoke (acute) (chronic): Secondary | ICD-10-CM | POA: Insufficient documentation

## 2021-04-30 DIAGNOSIS — E039 Hypothyroidism, unspecified: Secondary | ICD-10-CM | POA: Diagnosis not present

## 2021-04-30 DIAGNOSIS — K3189 Other diseases of stomach and duodenum: Secondary | ICD-10-CM | POA: Diagnosis not present

## 2021-04-30 DIAGNOSIS — Z20822 Contact with and (suspected) exposure to covid-19: Secondary | ICD-10-CM | POA: Diagnosis not present

## 2021-04-30 DIAGNOSIS — N3001 Acute cystitis with hematuria: Secondary | ICD-10-CM | POA: Diagnosis not present

## 2021-04-30 DIAGNOSIS — Z7982 Long term (current) use of aspirin: Secondary | ICD-10-CM | POA: Insufficient documentation

## 2021-04-30 DIAGNOSIS — K5730 Diverticulosis of large intestine without perforation or abscess without bleeding: Secondary | ICD-10-CM | POA: Diagnosis not present

## 2021-04-30 DIAGNOSIS — R519 Headache, unspecified: Secondary | ICD-10-CM | POA: Insufficient documentation

## 2021-04-30 DIAGNOSIS — I10 Essential (primary) hypertension: Secondary | ICD-10-CM | POA: Insufficient documentation

## 2021-04-30 DIAGNOSIS — D1800 Hemangioma unspecified site: Secondary | ICD-10-CM | POA: Diagnosis not present

## 2021-04-30 DIAGNOSIS — K8689 Other specified diseases of pancreas: Secondary | ICD-10-CM | POA: Diagnosis not present

## 2021-04-30 DIAGNOSIS — L539 Erythematous condition, unspecified: Secondary | ICD-10-CM | POA: Diagnosis present

## 2021-04-30 LAB — RESP PANEL BY RT-PCR (FLU A&B, COVID) ARPGX2
Influenza A by PCR: NEGATIVE
Influenza B by PCR: NEGATIVE
SARS Coronavirus 2 by RT PCR: NEGATIVE

## 2021-04-30 LAB — CBC
HCT: 38 % (ref 36.0–46.0)
Hemoglobin: 12.5 g/dL (ref 12.0–15.0)
MCH: 26.8 pg (ref 26.0–34.0)
MCHC: 32.9 g/dL (ref 30.0–36.0)
MCV: 81.5 fL (ref 80.0–100.0)
Platelets: 184 10*3/uL (ref 150–400)
RBC: 4.66 MIL/uL (ref 3.87–5.11)
RDW: 13.6 % (ref 11.5–15.5)
WBC: 8.5 10*3/uL (ref 4.0–10.5)
nRBC: 0 % (ref 0.0–0.2)

## 2021-04-30 LAB — BASIC METABOLIC PANEL
Anion gap: 6 (ref 5–15)
BUN: 16 mg/dL (ref 8–23)
CO2: 31 mmol/L (ref 22–32)
Calcium: 9.3 mg/dL (ref 8.9–10.3)
Chloride: 100 mmol/L (ref 98–111)
Creatinine, Ser: 1.1 mg/dL — ABNORMAL HIGH (ref 0.44–1.00)
GFR, Estimated: 55 mL/min — ABNORMAL LOW (ref >60)
Glucose, Bld: 97 mg/dL (ref 70–99)
Potassium: 4.6 mmol/L (ref 3.5–5.1)
Sodium: 137 mmol/L (ref 135–145)

## 2021-04-30 LAB — URINALYSIS, COMPLETE (UACMP) WITH MICROSCOPIC
Bilirubin Urine: NEGATIVE
Glucose, UA: NEGATIVE mg/dL
Ketones, ur: NEGATIVE mg/dL
Nitrite: NEGATIVE
Protein, ur: 30 mg/dL — AB
RBC / HPF: >50 RBC/hpf — ABNORMAL HIGH (ref 0–5)
Specific Gravity, Urine: 1.014 (ref 1.005–1.030)
Squamous Epithelial / HPF: >50 — ABNORMAL HIGH (ref 0–5)
WBC, UA: >50 WBC/hpf — ABNORMAL HIGH (ref 0–5)
pH: 5 (ref 5.0–8.0)

## 2021-04-30 MED ORDER — CEPHALEXIN 500 MG PO CAPS: 500.0000 mg | ORAL_CAPSULE | Freq: Four times a day (QID) | ORAL | 0 refills | Status: AC

## 2021-04-30 MED ORDER — SODIUM CHLORIDE 0.9 % IV BOLUS
1000.0000 mL | Freq: Once | INTRAVENOUS | Status: AC
  Administered 2021-04-30: 1000 mL via INTRAVENOUS

## 2021-04-30 MED ORDER — CEPHALEXIN 500 MG PO CAPS
500.0000 mg | ORAL_CAPSULE | Freq: Four times a day (QID) | ORAL | 0 refills | Status: DC
  Filled 2021-04-30: qty 28, 7d supply, fill #0

## 2021-04-30 MED ORDER — MUPIROCIN CALCIUM 2 % EX CREA
1.0000 "application " | TOPICAL_CREAM | Freq: Two times a day (BID) | CUTANEOUS | 0 refills | Status: DC
  Filled 2021-04-30: qty 15, 8d supply, fill #0

## 2021-04-30 MED ORDER — SODIUM CHLORIDE 0.9 % IV SOLN
1.0000 g | Freq: Once | INTRAVENOUS | Status: AC
  Administered 2021-04-30: 1 g via INTRAVENOUS
  Filled 2021-04-30: qty 10

## 2021-04-30 MED ORDER — MUPIROCIN CALCIUM 2 % EX CREA: 1.0000 "application " | TOPICAL_CREAM | Freq: Two times a day (BID) | CUTANEOUS | 0 refills | Status: AC

## 2021-04-30 MED FILL — Isosorbide Mononitrate Tab ER 24HR 30 MG: ORAL | 90 days supply | Qty: 90 | Fill #1 | Status: AC

## 2021-04-30 NOTE — ED Provider Notes (Signed)
[facility]-EMERGENCY DEPARTMENT  ____________________________________________  Time seen: Approximately 5:29 PM  I have reviewed the triage vital signs and the nursing notes.   HISTORY  Chief Complaint Animal Bite   Historian Patient    HPI Rebekah Perkins is a 65 y.o. female presents to the emergency department after patient had some mild erythema surrounding a cat scratch along the right lower leg.  There is no streaking.  Patient states that she has had some nausea, headache and mild dysuria today.  She denies chest pain, chest tightness or abdominal pain.  No shortness of breath.  Patient is not currently taking antibiotic.   Past Medical History:  Diagnosis Date   Anxiety    Apnea    Complication of anesthesia    slow to wake up afterwards   Diverticulitis    Environmental allergies    Hypertension    Menopausal state    Obesity    PMB (postmenopausal bleeding)    PVD (peripheral vascular disease) (HCC)    Rosacea    Simple endometrial hyperplasia    Sleep apnea    Thyroid disease    Urinary urgency      Immunizations up to date:  Yes.     Past Medical History:  Diagnosis Date   Anxiety    Apnea    Complication of anesthesia    slow to wake up afterwards   Diverticulitis    Environmental allergies    Hypertension    Menopausal state    Obesity    PMB (postmenopausal bleeding)    PVD (peripheral vascular disease) (HCC)    Rosacea    Simple endometrial hyperplasia    Sleep apnea    Thyroid disease    Urinary urgency     Patient Active Problem List   Diagnosis Date Noted   Chronic GERD    BMI 45.0-49.9, adult ([place]) 12/03/2019   Endometrial hyperplasia without atypia, simple 07/21/2015   Diverticulosis 07/21/2015   Neuritis or radiculitis due to rupture of lumbar intervertebral disc 11/12/2014   Lumbar canal stenosis 11/12/2014   Bursitis, trochanteric 11/12/2014   Back ache 04/10/2014   Chronic anemia 04/10/2014   BP (high blood pressure)  04/10/2014   Blood glucose elevated 04/10/2014   HLD (hyperlipidemia) 04/10/2014   Adult hypothyroidism 04/10/2014   Adiposity 04/10/2014   Obstructive apnea 04/10/2014    Past Surgical History:  Procedure Laterality Date   CHOLECYSTECTOMY     DILATION AND CURETTAGE OF UTERUS     ESOPHAGOGASTRODUODENOSCOPY (EGD) WITH PROPOFOL N/A 02/03/2021   Procedure: ESOPHAGOGASTRODUODENOSCOPY (EGD) WITH PROPOFOL;  Surgeon: Lin Landsman, MD;  Location: [place];  Service: Gastroenterology;  Laterality: N/A;   HYSTEROSCOPY      Prior to Admission medications   Medication Sig Start Date End Date Taking? Authorizing Provider  cephALEXin (KEFLEX) 500 MG capsule Take 1 capsule (500 mg total) by mouth 4 (four) times daily for 7 days. 04/30/21 05/07/21 Yes Vallarie Mare M, PA-C  mupirocin cream (BACTROBAN) 2 % Apply 1 application topically 2 (two) times daily for 7 days. Apply Mupirocin twice daily for seven days. 04/30/21 05/07/21 Yes Lannie Fields, PA-C  aspirin EC 81 MG tablet Take 81 mg by mouth daily.    [provider]  bisoprolol-hydrochlorothiazide [facility]) 5-6.25 MG tablet TAKE 1 TABLET BY MOUTH ONCE DAILY 01/14/21 01/14/22  Idelle Crouch, MD  calcium carbonate (OS-CAL) 600 MG TABS tablet Take 600 mg by mouth at bedtime.    [provider]  celecoxib (  CELEBREX) 200 MG capsule TAKE 1 CAPSULE BY MOUTH TWICE DAILY 02/15/21 02/15/22  Idelle Crouch, MD  cetirizine (ZYRTEC) 10 MG tablet Take 10 mg by mouth at bedtime.    [provider]  docusate sodium (COLACE) 100 MG capsule Take 100 mg by mouth 2 (two) times daily.    [provider]  famotidine (PEPCID) 40 MG tablet TAKE 1 TABLET (40 MG TOTAL) BY MOUTH NIGHTLY 09/10/20 09/10/21  Idelle Crouch, MD  fluticasone (FLONASE) 50 MCG/ACT nasal spray Place 2 sprays into both nostrils daily. 05/22/20     fluticasone (FLONASE) 50 MCG/ACT nasal spray Place 2 sprays into both nostrils once daily 03/05/21     gabapentin  (NEURONTIN) 300 MG capsule TAKE 1 CAPSULE BY MOUTH NIGHTLY 01/14/21 01/14/22  Idelle Crouch, MD  hydrochlorothiazide (HYDRODIURIL) 25 MG tablet TAKE 1 TABLET BY MOUTH ONCE DAILY 03/31/21 03/31/22    isosorbide mononitrate (IMDUR) 30 MG 24 hr tablet TAKE 1 TABLET BY MOUTH DAILY 07/23/20 08/15/21  Callwood, Dwayne D, MD  levothyroxine (SYNTHROID) 150 MCG tablet TAKE 1 TABLET BY MOUTH ON AN EMPTY STOMACH WITH A GLASS OF WATER AT LEAST 30-60 MINUTES BEFORE BREAKFAST ONCE DAILY 01/14/21 01/14/22  Idelle Crouch, MD  Methylcobalamin 1 MG CHEW Chew 1 tablet by mouth daily.    [provider]  pantoprazole (PROTONIX) 40 MG tablet TAKE 1 TABLET BY MOUTH TWICE DAILY BEFORE A MEAL. 01/20/21 01/20/22  Lin Landsman, MD  phentermine (ADIPEX-P) 37.5 MG tablet Take 1 tablet (37.5 mg total) by mouth once daily 03/05/21     Potassium 99 MG TABS Take by mouth in the morning and at bedtime.    [provider]  traZODone (DESYREL) 50 MG tablet TAKE 1 TABLET (50 MG TOTAL) BY MOUTH NIGHTLY 09/10/20 09/10/21  Idelle Crouch, MD  vitamin B-12 (CYANOCOBALAMIN) 1000 MCG tablet Take 1,000 mcg by mouth daily.    [provider]    Allergies Azithromycin and Penicillins  Family History  Problem Relation Age of Onset   Cancer Maternal Grandmother    Colon cancer Maternal Grandmother    Breast cancer Maternal Grandmother 39   Diabetes Mother    Heart disease Mother    Diabetes Sister    Breast cancer Sister    Cancer Sister    Breast cancer Maternal Aunt 59   Lung cancer Maternal Aunt    Ovarian cancer Neg Hx     Social History Social History   Tobacco Use   Smoking status: Passive Smoke Exposure - Never Smoker   Smokeless tobacco: Never  Vaping Use   Vaping Use: Never used  Substance Use Topics   Alcohol use: No   Drug use: No     Review of Systems  Constitutional: No fever/chills Eyes:  No discharge ENT: No upper respiratory complaints. Respiratory: no cough. No SOB/ use  of accessory muscles to breath Gastrointestinal:   No nausea, no vomiting.  No diarrhea.  No constipation. Genitourinary: Patient has dysuria.  Musculoskeletal: Negative for musculoskeletal pain. Skin:    ____________________________________________   PHYSICAL EXAM:  VITAL SIGNS: ED Triage Vitals [04/30/21 1427]  Enc Vitals Group     BP 117/81     Pulse Rate 61     Resp 17     Temp 97.7 F (36.5 C)     Temp Source Oral     SpO2 100 %     Weight      Height  Head Circumference      Peak Flow      Pain Score      Pain Loc      Pain Edu?      Excl. in [place]?      Constitutional: Alert and oriented. Well appearing and in no acute distress. Eyes: Conjunctivae are normal. PERRL. EOMI. Head: Atraumatic. ENT:      Nose: No congestion/rhinnorhea.      Mouth/Throat: Mucous membranes are moist.  Neck: No stridor.  No cervical spine tenderness to palpation. Cardiovascular: Normal rate, regular rhythm. Normal S1 and S2.  Good peripheral circulation. Respiratory: Normal respiratory effort without tachypnea or retractions. Lungs CTAB. Good air entry to the bases with no decreased or absent breath sounds Gastrointestinal: Bowel sounds x 4 quadrants. Soft and nontender to palpation. No guarding or rigidity. No distention. Musculoskeletal: Full range of motion to all extremities. No obvious deformities noted Neurologic:  Normal for age. No gross focal neurologic deficits are appreciated.  Skin:  Skin is warm, dry and intact. No rash noted. Psychiatric: Mood and affect are normal for age. Speech and behavior are normal.   ____________________________________________   LABS (all labs ordered are listed, but only abnormal results are displayed)  Labs Reviewed  BASIC METABOLIC PANEL - Abnormal; Notable for the following components:      Result Value   Creatinine, Ser 1.10 (*)    GFR, Estimated 55 (*)    All other components within normal limits  URINALYSIS, COMPLETE (UACMP) WITH  MICROSCOPIC - Abnormal; Notable for the following components:   Color, Urine YELLOW (*)    APPearance TURBID (*)    Hgb urine dipstick MODERATE (*)    Protein, ur 30 (*)    Leukocytes,Ua LARGE (*)    RBC / HPF >50 (*)    WBC, UA >50 (*)    Bacteria, UA MANY (*)    Squamous Epithelial / LPF >50 (*)    All other components within normal limits  RESP PANEL BY RT-PCR (FLU A&B, COVID) ARPGX2  CBC   ____________________________________________  EKG   ____________________________________________  RADIOLOGY   CT Head Wo Contrast  Result Date: 04/30/2021 CLINICAL DATA:  Headaches EXAM: CT HEAD WITHOUT CONTRAST TECHNIQUE: Contiguous axial images were obtained from the base of the skull through the vertex without intravenous contrast. COMPARISON:  05/08/2017 FINDINGS: Brain: No evidence of acute infarction, hemorrhage, hydrocephalus, extra-axial collection or mass lesion/mass effect. Vascular: No hyperdense vessel or unexpected calcification. Skull: Normal. Negative for fracture or focal lesion. Sinuses/Orbits: No acute finding. Other: None. IMPRESSION: No acute intracranial abnormality noted. Electronically Signed   By: Inez Catalina M.D.   On: 04/30/2021 18:43   CT Renal Stone Study  Result Date: 04/30/2021 CLINICAL DATA:  Flank pain, kidney stone suspected Patient reports headache and nausea since having a cat scratch on right lower leg. EXAM: CT ABDOMEN AND PELVIS WITHOUT CONTRAST TECHNIQUE: Multidetector CT imaging of the abdomen and pelvis was performed following the standard protocol without IV contrast. COMPARISON:  CT 05/03/2016 FINDINGS: Lower chest: No confluent airspace disease or pleural effusion. Coronary artery calcifications are seen. Hepatobiliary: Liver is enlarged spanning 20 cm cranial caudal. No focal hepatic abnormality on this noncontrast exam. Clips in the gallbladder fossa postcholecystectomy. No biliary dilatation. Pancreas: Mild pancreatic atrophy. No ductal dilatation or  inflammation. Spleen: Mildly enlarged spanning 15.0 x 5.6 x 11.7 cm (volume = 510 cm^3). No focal abnormality. Adrenals/Urinary Tract: No adrenal nodule. No hydronephrosis or renal calculi. No ureteral stone.  4.2 cm water density lesion in the lower anterior left kidney is consistent with cyst. No evidence of solid lesion. The urinary bladder is partially distended. No bladder wall thickening for degree of distension. No bladder stone. Stomach/Bowel: Ingested material distends the stomach, no gastric inflammation. No small bowel obstruction or inflammatory change. Appendix tentatively visualized and normal. No appendicitis. Moderate colonic stool burden with mild colonic tortuosity. No colonic wall thickening or inflammation. Minimal distal colonic diverticulosis without diverticulitis. Vascular/Lymphatic: Aorto bi-iliac atherosclerosis. No aortic aneurysm. No portal venous or mesenteric gas. No abdominopelvic adenopathy. No inguinal adenopathy. Reproductive: Occasional uterine calcifications may represent calcified fibroids. There is no adnexal mass. Other: No ascites or free air. No abdominopelvic collection. Stable edema in the lower abdominal pannus. Musculoskeletal: Multilevel facet hypertrophy is well as mild degenerative disc disease in the lower lumbar spine. There are no acute or suspicious osseous abnormalities. Small L2 vertebral body hemangioma stable from prior exam. IMPRESSION: 1. No renal stones or obstructive uropathy. No acute abnormality in the abdomen/pelvis. 2. Mild hepatosplenomegaly. 3. Minimal distal colonic diverticulosis without diverticulitis. Aortic Atherosclerosis (ICD10-I70.0). Electronically Signed   By: Keith Rake M.D.   On: 04/30/2021 18:47    ____________________________________________    PROCEDURES  Procedure(s) performed:     Procedures     Medications  cefTRIAXone (ROCEPHIN) 1 g in sodium chloride 0.9 % 100 mL IVPB (1 g Intravenous New Bag/Given 04/30/21  1948)  sodium chloride 0.9 % bolus 1,000 mL (1,000 mLs Intravenous New Bag/Given 04/30/21 1948)     ____________________________________________   INITIAL IMPRESSION / ASSESSMENT AND PLAN / ED COURSE  Pertinent labs & imaging results that were available during my care of the patient were reviewed by me and considered in my medical decision making (see chart for details).      Assessment and Plan: UTI Cat scratch Leg cellulitis 65 year old female presents to the emergency department with a scratch of the right lower extremity from a cat and also headache and nausea.  Vital signs are reassuring at triage.  On physical exam, patient had scabbing of the right lower extremity with maybe 0.5 cm of circumferential cellulitis.  No palpable induration or fluctuance.  Patient also complained of headache and nausea.  CBC and BMP were reassuring.  CT renal stone study showed no evidence of kidney stone.  Urinalysis indicated a nitrate positive UTI.  Urine culture is pending.  Will treat patient with Keflex 4 times daily for the next 7 days which will also cover patient for small region of cellulitis surrounding cat scratch of right lower extremity.  Patient was also discharged with topical mupirocin.   ____________________________________________  FINAL CLINICAL IMPRESSION(S) / ED DIAGNOSES  Final diagnoses:  Acute cystitis with hematuria      NEW MEDICATIONS STARTED DURING THIS VISIT:  ED Discharge Orders          Ordered    cephALEXin (KEFLEX) 500 MG capsule  4 times daily        04/30/21 1946    mupirocin cream (BACTROBAN) 2 %  2 times daily        04/30/21 1947                This chart was dictated using voice recognition software/Dragon. Despite best efforts to proofread, errors can occur which can change the meaning. Any change was purely unintentional.     Lannie Fields, PA-C 04/30/21 1958    Merlyn Lot, MD 05/01/21 [phone]

## 2021-04-30 NOTE — ED Triage Notes (Signed)
Pt sent to ED from walk in clinic due to cat scratch to the right lower leg and since has had HA and nausea. Per pt, cat was not up to date on rabies vaccinations. Area to the right lower leg has scabs with redness around scabbed area, no streaking noted or warmth felt. Pt denies fever.

## 2021-04-30 NOTE — ED Notes (Signed)
Pt states coming in for a headache, nausea and possible kidney stones. Pt alert and oriented. Pt transported to CT with CT tech on stretcher.

## 2021-04-30 NOTE — Discharge Instructions (Addendum)
You can take Keflex 4 times daily for the next 7 days. Apply Mupirocin twice daily for the next seven days.

## 2021-05-03 ENCOUNTER — Other Ambulatory Visit: Payer: Self-pay

## 2021-05-04 ENCOUNTER — Encounter: Payer: Self-pay | Admitting: Gastroenterology

## 2021-05-05 ENCOUNTER — Ambulatory Visit: Payer: 59 | Admitting: Anesthesiology

## 2021-05-05 ENCOUNTER — Ambulatory Visit
Admission: RE | Admit: 2021-05-05 | Discharge: 2021-05-05 | Disposition: A | Discharge status: Home / Self Care | Payer: 59 | Attending: Gastroenterology | Admitting: Gastroenterology

## 2021-05-05 ENCOUNTER — Encounter
Admission: RE | Disposition: A | Discharge status: Home / Self Care | Payer: Self-pay | Source: Home / Self Care | Attending: Gastroenterology

## 2021-05-05 ENCOUNTER — Encounter: Payer: Self-pay | Admitting: Gastroenterology

## 2021-05-05 DIAGNOSIS — K635 Polyp of colon: Secondary | ICD-10-CM

## 2021-05-05 DIAGNOSIS — Z79899 Other long term (current) drug therapy: Secondary | ICD-10-CM | POA: Insufficient documentation

## 2021-05-05 DIAGNOSIS — Z881 Allergy status to other antibiotic agents status: Secondary | ICD-10-CM | POA: Insufficient documentation

## 2021-05-05 DIAGNOSIS — Z7982 Long term (current) use of aspirin: Secondary | ICD-10-CM | POA: Diagnosis not present

## 2021-05-05 DIAGNOSIS — Z1211 Encounter for screening for malignant neoplasm of colon: Secondary | ICD-10-CM | POA: Diagnosis not present

## 2021-05-05 DIAGNOSIS — K644 Residual hemorrhoidal skin tags: Secondary | ICD-10-CM | POA: Insufficient documentation

## 2021-05-05 DIAGNOSIS — D122 Benign neoplasm of ascending colon: Secondary | ICD-10-CM | POA: Insufficient documentation

## 2021-05-05 DIAGNOSIS — Z791 Long term (current) use of non-steroidal anti-inflammatories (NSAID): Secondary | ICD-10-CM | POA: Insufficient documentation

## 2021-05-05 DIAGNOSIS — Z79890 Hormone replacement therapy (postmenopausal): Secondary | ICD-10-CM | POA: Insufficient documentation

## 2021-05-05 DIAGNOSIS — K5730 Diverticulosis of large intestine without perforation or abscess without bleeding: Secondary | ICD-10-CM | POA: Insufficient documentation

## 2021-05-05 DIAGNOSIS — Z880 Allergy status to penicillin: Secondary | ICD-10-CM | POA: Diagnosis not present

## 2021-05-05 DIAGNOSIS — D124 Benign neoplasm of descending colon: Secondary | ICD-10-CM | POA: Diagnosis not present

## 2021-05-05 DIAGNOSIS — K219 Gastro-esophageal reflux disease without esophagitis: Secondary | ICD-10-CM | POA: Diagnosis not present

## 2021-05-05 HISTORY — PX: COLONOSCOPY WITH PROPOFOL: SHX5780

## 2021-05-05 SURGERY — COLONOSCOPY WITH PROPOFOL
Anesthesia: General

## 2021-05-05 MED ORDER — LIDOCAINE HCL (PF) 1 % IJ SOLN
INTRAMUSCULAR | Status: AC
  Filled 2021-05-05: qty 2

## 2021-05-05 MED ORDER — EPHEDRINE SULFATE 50 MG/ML IJ SOLN
INTRAMUSCULAR | Status: DC | PRN
  Administered 2021-05-05: 15 mg via INTRAVENOUS
  Administered 2021-05-05: 10 mg via INTRAVENOUS

## 2021-05-05 MED ORDER — GLYCOPYRROLATE PF 0.2 MG/ML IJ SOSY
PREFILLED_SYRINGE | INTRAMUSCULAR | Status: DC | PRN
  Administered 2021-05-05: .2 mg via INTRAVENOUS

## 2021-05-05 MED ORDER — PROPOFOL 500 MG/50ML IV EMUL
INTRAVENOUS | Status: DC | PRN
  Administered 2021-05-05: 150 ug/kg/min via INTRAVENOUS

## 2021-05-05 MED ORDER — SODIUM CHLORIDE 0.9 % IV SOLN: INTRAVENOUS | Status: DC

## 2021-05-05 MED ORDER — PROPOFOL 10 MG/ML IV BOLUS
INTRAVENOUS | Status: DC | PRN
  Administered 2021-05-05: 60 mg via INTRAVENOUS

## 2021-05-05 MED ORDER — ONDANSETRON HCL 4 MG/2ML IJ SOLN
INTRAMUSCULAR | Status: DC | PRN
  Administered 2021-05-05: 4 mg via INTRAVENOUS

## 2021-05-05 NOTE — H&P (Signed)
Cephas Darby, MD [address]  [place]  [place], [place] [address]  Main: [phone]  Fax: [phone] Pager: [phone]  Primary Care Physician:  Idelle Crouch, MD Primary Gastroenterologist:  Dr. Cephas Darby  Pre-Procedure History & Physical: HPI:  Rebekah Perkins is a 65 y.o. female is here for an colonoscopy.   Past Medical History:  Diagnosis Date   Anxiety    Apnea    Complication of anesthesia    slow to wake up afterwards   Diverticulitis    Environmental allergies    Hypertension    Menopausal state    Obesity    PMB (postmenopausal bleeding)    PVD (peripheral vascular disease) (HCC)    Rosacea    Simple endometrial hyperplasia    Sleep apnea    Thyroid disease    Urinary urgency     Past Surgical History:  Procedure Laterality Date   CHOLECYSTECTOMY     DILATION AND CURETTAGE OF UTERUS     ESOPHAGOGASTRODUODENOSCOPY (EGD) WITH PROPOFOL N/A 02/03/2021   Procedure: ESOPHAGOGASTRODUODENOSCOPY (EGD) WITH PROPOFOL;  Surgeon: Lin Landsman, MD;  Location: [facility];  Service: Gastroenterology;  Laterality: N/A;   HYSTEROSCOPY      Prior to Admission medications   Medication Sig Start Date End Date Taking? Authorizing Provider  aspirin EC 81 MG tablet Take 81 mg by mouth daily.   Yes [provider]  bisoprolol-hydrochlorothiazide (ZIAC) 5-6.25 MG tablet TAKE 1 TABLET BY MOUTH ONCE DAILY 01/14/21 01/14/22 Yes Idelle Crouch, MD  celecoxib (CELEBREX) 200 MG capsule TAKE 1 CAPSULE BY MOUTH TWICE DAILY 02/15/21 02/15/22 Yes Idelle Crouch, MD  cetirizine (ZYRTEC) 10 MG tablet Take 10 mg by mouth at bedtime.   Yes [provider]  mupirocin cream (BACTROBAN) 2 % Apply 1 application topically 2 (two) times daily for 7 days. Apply Mupirocin twice daily for seven days. 04/30/21 05/07/21 Yes Vallarie Mare M, PA-C  calcium carbonate (OS-CAL) 600 MG TABS tablet Take 600 mg by mouth at bedtime.    [provider]   cephALEXin (KEFLEX) 500 MG capsule Take 1 capsule (500 mg total) by mouth 4 (four) times daily for 7 days. 04/30/21 05/07/21  Lannie Fields, PA-C  docusate sodium (COLACE) 100 MG capsule Take 100 mg by mouth 2 (two) times daily.    [provider]  famotidine (PEPCID) 40 MG tablet TAKE 1 TABLET (40 MG TOTAL) BY MOUTH NIGHTLY 09/10/20 09/10/21  Idelle Crouch, MD  fluticasone (FLONASE) 50 MCG/ACT nasal spray Place 2 sprays into both nostrils daily. 05/22/20     fluticasone (FLONASE) 50 MCG/ACT nasal spray Place 2 sprays into both nostrils once daily 03/05/21     gabapentin (NEURONTIN) 300 MG capsule TAKE 1 CAPSULE BY MOUTH NIGHTLY 01/14/21 01/14/22  Idelle Crouch, MD  hydrochlorothiazide (HYDRODIURIL) 25 MG tablet TAKE 1 TABLET BY MOUTH ONCE DAILY 03/31/21 03/31/22    isosorbide mononitrate (IMDUR) 30 MG 24 hr tablet TAKE 1 TABLET BY MOUTH DAILY 07/23/20 08/15/21  Callwood, Dwayne D, MD  levothyroxine (SYNTHROID) 150 MCG tablet TAKE 1 TABLET BY MOUTH ON AN EMPTY STOMACH WITH A GLASS OF WATER AT LEAST 30-60 MINUTES BEFORE BREAKFAST ONCE DAILY 01/14/21 01/14/22  Idelle Crouch, MD  Methylcobalamin 1 MG CHEW Chew 1 tablet by mouth daily. Patient not taking: Reported on 05/05/2021    [provider]  pantoprazole (PROTONIX) 40 MG tablet TAKE 1 TABLET BY MOUTH TWICE DAILY BEFORE A MEAL. 01/20/21 01/20/22  Lin Landsman, MD  phentermine (ADIPEX-P) 37.5 MG tablet Take 1 tablet (37.5 mg total) by mouth once daily 03/05/21     Potassium 99 MG TABS Take by mouth in the morning and at bedtime.    [provider]  traZODone (DESYREL) 50 MG tablet TAKE 1 TABLET (50 MG TOTAL) BY MOUTH NIGHTLY 09/10/20 09/10/21  Idelle Crouch, MD  vitamin B-12 (CYANOCOBALAMIN) 1000 MCG tablet Take 1,000 mcg by mouth daily.    [provider]    Allergies as of 04/22/2021 - Review Complete 04/22/2021  Allergen Reaction Noted   Azithromycin Itching 07/17/2015   Penicillins Itching 07/17/2015     Family History  Problem Relation Age of Onset   Cancer Maternal Grandmother    Colon cancer Maternal Grandmother    Breast cancer Maternal Grandmother 69   Diabetes Mother    Heart disease Mother    Diabetes Sister    Breast cancer Sister    Cancer Sister    Breast cancer Maternal Aunt 33   Lung cancer Maternal Aunt    Ovarian cancer Neg Hx     Social History   Socioeconomic History   Marital status: Single    Spouse name: Not on file   Number of children: Not on file   Years of education: Not on file   Highest education level: Not on file  Occupational History   Not on file  Tobacco Use   Smoking status: Passive Smoke Exposure - Never Smoker   Smokeless tobacco: Never  Vaping Use   Vaping Use: Never used  Substance and Sexual Activity   Alcohol use: No   Drug use: No   Sexual activity: Never  Other Topics Concern   Not on file  Social History Narrative   Not on file   Social Determinants of Health   Financial Resource Strain: Not on file  Food Insecurity: Not on file  Transportation Needs: Not on file  Physical Activity: Not on file  Stress: Not on file  Social Connections: Not on file  Intimate Partner Violence: Not on file    Review of Systems: See HPI, otherwise negative ROS  Physical Exam: BP 133/84   Pulse 69   Temp (!) 96.3 F (35.7 C) (Temporal)   Resp 18   Ht 5\' 3"  (1.6 m)   Wt 120.2 kg   SpO2 97%   BMI 46.94 kg/m  General:   Alert,  pleasant and cooperative in NAD Head:  Normocephalic and atraumatic. Neck:  Supple; no masses or thyromegaly. Lungs:  Clear throughout to auscultation.    Heart:  Regular rate and rhythm. Abdomen:  Soft, nontender and nondistended. Normal bowel sounds, without guarding, and without rebound.   Neurologic:  Alert and  oriented x4;  grossly normal neurologically.  Impression/Plan: Rebekah Perkins is here for an colonoscopy to be performed for colon cancer screening  Risks, benefits, limitations, and  alternatives regarding  colonoscopy have been reviewed with the patient.  Questions have been answered.  All parties agreeable.   Sherri Sear, MD  05/05/2021, 8:59 AM

## 2021-05-05 NOTE — Anesthesia Procedure Notes (Signed)
Date/Time: 05/05/2021 9:15 AM Performed by: Allean Found, CRNA Pre-anesthesia Checklist: Patient identified, Emergency Drugs available, Suction available, Patient being monitored and Timeout performed Oxygen Delivery Method: Simple face mask Comments: Used POM

## 2021-05-05 NOTE — Transfer of Care (Signed)
Immediate Anesthesia Transfer of Care Note  Patient: Rebekah Perkins  Procedure(s) Performed: COLONOSCOPY WITH PROPOFOL  Patient Location: PACU  Anesthesia Type:General  Level of Consciousness: sedated  Airway & Oxygen Therapy: Patient Spontanous Breathing and Patient connected to nasal cannula oxygen  Post-op Assessment: Report given to RN and Post -op Vital signs reviewed and stable  Post vital signs: Reviewed and stable  Last Vitals:  Vitals Value Taken Time  BP    Temp    Pulse 77 05/05/21 0959  Resp 15 05/05/21 0959  SpO2 100 % 05/05/21 0959  Vitals shown include unvalidated device data.  Last Pain:  Vitals:   05/05/21 0823  TempSrc: Temporal  PainSc: 0-No pain         Complications: No notable events documented.

## 2021-05-05 NOTE — Op Note (Signed)
[facility] Gastroenterology Patient Name: Rebekah Perkins Procedure Date: 05/05/2021 8:57 AM MRN: [id number] Account #: [id number] Date of Birth: 1956-09-28 Admit Type: Outpatient Age: 65 Room: [facility] ENDO ROOM 4 Gender: Female Note Status: Finalized Procedure:             Colonoscopy Indications:           Screening for colorectal malignant neoplasm, Last                         colonoscopy: May 2013 Providers:             Lin Landsman MD, MD Referring MD:          Leonie Douglas. Doy Hutching, MD (Referring MD) Medicines:             General Anesthesia Complications:         No immediate complications. Estimated blood loss: None. Procedure:             Pre-Anesthesia Assessment:                        - Prior to the procedure, a History and Physical was                         performed, and patient medications and allergies were                         reviewed. The patient is competent. The risks and                         benefits of the procedure and the sedation options and                         risks were discussed with the patient. All questions                         were answered and informed consent was obtained.                         Patient identification and proposed procedure were                         verified by the physician, the nurse, the                         anesthesiologist, the anesthetist and the technician                         in the pre-procedure area in the procedure room in the                         endoscopy suite. Mental Status Examination: alert and                         oriented. Airway Examination: normal oropharyngeal                         airway and neck mobility. Respiratory Examination:  clear to auscultation. CV Examination: normal.                         Prophylactic Antibiotics: The patient does not require                         prophylactic antibiotics. Prior Anticoagulants: The                          patient has taken no previous anticoagulant or                         antiplatelet agents. ASA Grade Assessment: III - A                         patient with severe systemic disease. After reviewing                         the risks and benefits, the patient was deemed in                         satisfactory condition to undergo the procedure. The                         anesthesia plan was to use general anesthesia.                         Immediately prior to administration of medications,                         the patient was re-assessed for adequacy to receive                         sedatives. The heart rate, respiratory rate, oxygen                         saturations, blood pressure, adequacy of pulmonary                         ventilation, and response to care were monitored                         throughout the procedure. The physical status of the                         patient was re-assessed after the procedure.                        After obtaining informed consent, the colonoscope was                         passed under direct vision. Throughout the procedure,                         the patient's blood pressure, pulse, and oxygen                         saturations were monitored continuously. The  Colonoscope was introduced through the anus and                         advanced to the the terminal ileum, with                         identification of the appendiceal orifice and IC                         valve. The colonoscopy was performed without                         difficulty. The patient tolerated the procedure well.                         The quality of the bowel preparation was evaluated                         using the BBPS [facility] Bowel Preparation Scale) with                         scores of: Right Colon = 3, Transverse Colon = 3 and                         Left Colon = 3 (entire mucosa seen well with no                          residual staining, small fragments of stool or opaque                         liquid). The total BBPS score equals 9. Findings:      The perianal and digital rectal examinations were normal. Pertinent       negatives include normal sphincter tone and no palpable rectal lesions.      The terminal ileum appeared normal.      Two sessile polyps were found in the descending colon. The polyps were 3       to 4 mm in size. These polyps were removed with a cold snare. Resection       and retrieval were complete.      A few diverticula were found in the sigmoid colon.      Non-bleeding external hemorrhoids were found during retroflexion. The       hemorrhoids were large. Impression:            - The examined portion of the ileum was normal.                        - Two 3 to 4 mm polyps in the descending colon,                         removed with a cold snare. Resected and retrieved.                        - Diverticulosis in the sigmoid colon.                        - Non-bleeding external hemorrhoids. Recommendation:        -  Discharge patient to home (with escort).                        - Resume previous diet today.                        - Continue present medications.                        - Await pathology results.                        - Repeat colonoscopy in 7 years for surveillance. Procedure Code(s):     --- Professional ---                        [phone], Colonoscopy, flexible; with removal of                         tumor(s), polyp(s), or other lesion(s) by snare                         technique Diagnosis Code(s):     --- Professional ---                        Z12.11, Encounter for screening for malignant neoplasm                         of colon                        K64.4, Residual hemorrhoidal skin tags                        K63.5, Polyp of colon                        K57.30, Diverticulosis of large intestine without                         perforation or  abscess without bleeding CPT copyright 2019 American Medical Association. All rights reserved. The codes documented in this report are preliminary and upon coder review may  be revised to meet current compliance requirements. Dr. Ulyess Mort Lin Landsman MD, MD 05/05/2021 9:46:33 AM This report has been signed electronically. Number of Addenda: 0 Note Initiated On: 05/05/2021 8:57 AM Scope Withdrawal Time: 0 hours 12 minutes 42 seconds  Total Procedure Duration: 0 hours 17 minutes 37 seconds  Estimated Blood Loss:  Estimated blood loss: none.      [facility]

## 2021-05-05 NOTE — Anesthesia Procedure Notes (Signed)
Date/Time: 05/05/2021 9:15 AM Performed by: Allean Found, CRNA Pre-anesthesia Checklist: Patient identified, Emergency Drugs available, Suction available, Patient being monitored and Timeout performed Patient Re-evaluated:Patient Re-evaluated prior to induction Oxygen Delivery Method: Nasal cannula Induction Type: IV induction Placement Confirmation: positive ETCO2

## 2021-05-05 NOTE — Anesthesia Postprocedure Evaluation (Signed)
Anesthesia Post Note  Patient: Rebekah Perkins  Procedure(s) Performed: COLONOSCOPY WITH PROPOFOL  Patient location during evaluation: Endoscopy Anesthesia Type: General Level of consciousness: awake and alert and oriented Pain management: pain level controlled Vital Signs Assessment: post-procedure vital signs reviewed and stable Respiratory status: spontaneous breathing, nonlabored ventilation and respiratory function stable Cardiovascular status: blood pressure returned to baseline and stable Postop Assessment: no signs of nausea or vomiting Anesthetic complications: no   No notable events documented.   Last Vitals:  Vitals:   05/05/21 1010 05/05/21 1016  BP:  119/68  Pulse: 65 66  Resp: 18 13  Temp:    SpO2: 100% 99%    Last Pain:  Vitals:   05/05/21 1001  TempSrc: Temporal  PainSc:                  [name]

## 2021-05-05 NOTE — Anesthesia Preprocedure Evaluation (Signed)
Anesthesia Evaluation  Patient identified by MRN, date of birth, ID band Patient awake    Reviewed: Allergy & Precautions, NPO status , Patient's Chart, lab work & pertinent test results  History of Anesthesia Complications (+) PROLONGED EMERGENCE and history of anesthetic complications  Airway Mallampati: III  TM Distance: >3 FB Neck ROM: Full    Dental  (+) Loose, Poor Dentition   Pulmonary sleep apnea and Continuous Positive Airway Pressure Ventilation , neg COPD,    breath sounds clear to auscultation- rhonchi (-) wheezing      Cardiovascular hypertension, Pt. on medications (-) CAD, (-) Past MI, (-) Cardiac Stents and (-) CABG  Rhythm:Regular Rate:Normal - Systolic murmurs and - Diastolic murmurs    Neuro/Psych neg Seizures Anxiety negative neurological ROS     GI/Hepatic Neg liver ROS, GERD  ,  Endo/Other  neg diabetesHypothyroidism   Renal/GU negative Renal ROS     Musculoskeletal negative musculoskeletal ROS (+)   Abdominal (+) + obese,   Peds  Hematology  (+) anemia ,   Anesthesia Other Findings Past Medical History: No date: Anxiety No date: Apnea No date: Complication of anesthesia     Comment:  slow to wake up afterwards No date: Diverticulitis No date: Environmental allergies No date: Hypertension No date: Menopausal state No date: Obesity No date: PMB (postmenopausal bleeding) No date: PVD (peripheral vascular disease) (HCC) No date: Rosacea No date: Simple endometrial hyperplasia No date: Sleep apnea No date: Thyroid disease No date: Urinary urgency   Reproductive/Obstetrics                             Anesthesia Physical Anesthesia Plan  ASA: 3  Anesthesia Plan: General   Post-op Pain Management:    Induction: Intravenous  PONV Risk Score and Plan: 2 and Propofol infusion  Airway Management Planned: Natural Airway  Additional Equipment:   Intra-op  Plan:   Post-operative Plan:   Informed Consent: I have reviewed the patients History and Physical, chart, labs and discussed the procedure including the risks, benefits and alternatives for the proposed anesthesia with the patient or authorized representative who has indicated his/her understanding and acceptance.     Dental advisory given  Plan Discussed with: CRNA and Anesthesiologist  Anesthesia Plan Comments:         Anesthesia Quick Evaluation

## 2021-05-06 ENCOUNTER — Encounter: Payer: Self-pay | Admitting: Gastroenterology

## 2021-05-06 LAB — SURGICAL PATHOLOGY

## 2021-05-07 ENCOUNTER — Encounter: Payer: Self-pay | Admitting: Gastroenterology

## 2021-05-20 DIAGNOSIS — R399 Unspecified symptoms and signs involving the genitourinary system: Secondary | ICD-10-CM | POA: Diagnosis not present

## 2021-05-29 ENCOUNTER — Other Ambulatory Visit: Payer: Self-pay

## 2021-05-31 ENCOUNTER — Other Ambulatory Visit: Payer: Self-pay

## 2021-05-31 MED ORDER — CELECOXIB 200 MG PO CAPS
ORAL_CAPSULE | ORAL | 1 refills | Status: AC
  Filled 2021-05-31: qty 180, 90d supply, fill #0
  Filled 2021-08-24: qty 180, 90d supply, fill #1

## 2021-06-01 ENCOUNTER — Other Ambulatory Visit: Payer: Self-pay

## 2021-06-14 ENCOUNTER — Other Ambulatory Visit: Payer: Self-pay

## 2021-06-14 MED FILL — Trazodone HCl Tab 50 MG: ORAL | 90 days supply | Qty: 90 | Fill #1 | Status: AC

## 2021-06-15 ENCOUNTER — Other Ambulatory Visit: Payer: Self-pay

## 2021-06-15 MED ORDER — GABAPENTIN 300 MG PO CAPS
ORAL_CAPSULE | ORAL | 1 refills | Status: DC
  Filled 2021-06-15 – 2021-07-07 (×2): qty 90, 90d supply, fill #0

## 2021-06-15 MED ORDER — BISOPROLOL-HYDROCHLOROTHIAZIDE 5-6.25 MG PO TABS
1.0000 | ORAL_TABLET | Freq: Every day | ORAL | 1 refills | Status: AC
  Filled 2021-06-15 – 2021-06-23 (×2): qty 90, 90d supply, fill #0

## 2021-06-15 MED ORDER — LEVOTHYROXINE SODIUM 150 MCG PO TABS
ORAL_TABLET | ORAL | 1 refills | Status: DC
  Filled 2021-06-15: qty 90, 90d supply, fill #0

## 2021-06-16 ENCOUNTER — Other Ambulatory Visit: Payer: Self-pay

## 2021-06-16 DIAGNOSIS — L578 Other skin changes due to chronic exposure to nonionizing radiation: Secondary | ICD-10-CM | POA: Diagnosis not present

## 2021-06-16 DIAGNOSIS — Z86018 Personal history of other benign neoplasm: Secondary | ICD-10-CM | POA: Diagnosis not present

## 2021-06-16 DIAGNOSIS — L2389 Allergic contact dermatitis due to other agents: Secondary | ICD-10-CM | POA: Diagnosis not present

## 2021-06-16 DIAGNOSIS — L298 Other pruritus: Secondary | ICD-10-CM | POA: Diagnosis not present

## 2021-06-16 DIAGNOSIS — Z872 Personal history of diseases of the skin and subcutaneous tissue: Secondary | ICD-10-CM | POA: Diagnosis not present

## 2021-06-16 MED ORDER — GABAPENTIN 300 MG PO CAPS
ORAL_CAPSULE | ORAL | 1 refills | Status: AC
  Filled 2021-06-16: qty 90, 90d supply, fill #0

## 2021-06-16 MED ORDER — LEVOTHYROXINE SODIUM 150 MCG PO TABS
ORAL_TABLET | ORAL | 1 refills | Status: DC
  Filled 2021-06-16: qty 90, 90d supply, fill #0

## 2021-06-16 MED ORDER — BISOPROLOL-HYDROCHLOROTHIAZIDE 5-6.25 MG PO TABS
ORAL_TABLET | ORAL | 1 refills | Status: DC
  Filled 2021-06-16: qty 90, 90d supply, fill #0

## 2021-06-21 ENCOUNTER — Other Ambulatory Visit: Payer: Self-pay

## 2021-06-23 ENCOUNTER — Other Ambulatory Visit: Payer: Self-pay

## 2021-06-23 MED ORDER — FLUTICASONE PROPIONATE 50 MCG/ACT NA SUSP
2.0000 | Freq: Every day | NASAL | 9 refills | Status: DC
  Filled 2021-06-23: qty 16, 30d supply, fill #0

## 2021-06-24 ENCOUNTER — Other Ambulatory Visit: Payer: Self-pay

## 2021-06-24 DIAGNOSIS — I10 Essential (primary) hypertension: Secondary | ICD-10-CM | POA: Diagnosis not present

## 2021-06-24 DIAGNOSIS — R079 Chest pain, unspecified: Secondary | ICD-10-CM | POA: Diagnosis not present

## 2021-06-24 DIAGNOSIS — I208 Other forms of angina pectoris: Secondary | ICD-10-CM | POA: Diagnosis not present

## 2021-06-24 DIAGNOSIS — R0602 Shortness of breath: Secondary | ICD-10-CM | POA: Diagnosis not present

## 2021-06-24 MED ORDER — LEVOTHYROXINE SODIUM 150 MCG PO TABS
ORAL_TABLET | ORAL | 1 refills | Status: AC
  Filled 2021-06-24: qty 90, 90d supply, fill #0

## 2021-06-25 ENCOUNTER — Other Ambulatory Visit: Payer: Self-pay

## 2021-06-28 ENCOUNTER — Other Ambulatory Visit: Payer: Self-pay

## 2021-07-06 ENCOUNTER — Other Ambulatory Visit: Payer: Self-pay

## 2021-07-07 ENCOUNTER — Other Ambulatory Visit: Payer: Self-pay

## 2021-07-07 MED ORDER — GABAPENTIN 300 MG PO CAPS
300.0000 mg | ORAL_CAPSULE | Freq: Every day | ORAL | 1 refills | Status: DC
  Filled 2021-07-07: qty 90, 90d supply, fill #0

## 2021-07-08 ENCOUNTER — Other Ambulatory Visit: Payer: Self-pay

## 2021-07-08 DIAGNOSIS — J0100 Acute maxillary sinusitis, unspecified: Secondary | ICD-10-CM | POA: Diagnosis not present

## 2021-07-08 DIAGNOSIS — K0889 Other specified disorders of teeth and supporting structures: Secondary | ICD-10-CM | POA: Diagnosis not present

## 2021-07-08 DIAGNOSIS — J029 Acute pharyngitis, unspecified: Secondary | ICD-10-CM | POA: Diagnosis not present

## 2021-07-08 DIAGNOSIS — H9201 Otalgia, right ear: Secondary | ICD-10-CM | POA: Diagnosis not present

## 2021-07-08 MED ORDER — CEFDINIR 300 MG PO CAPS
ORAL_CAPSULE | ORAL | 0 refills | Status: DC
  Filled 2021-07-08: qty 14, 7d supply, fill #0

## 2021-07-08 MED ORDER — NAPROXEN 500 MG PO TABS
ORAL_TABLET | ORAL | 0 refills | Status: DC
  Filled 2021-07-08: qty 14, 7d supply, fill #0

## 2021-07-08 MED ORDER — TIZANIDINE HCL 2 MG PO TABS
ORAL_TABLET | ORAL | 0 refills | Status: DC
  Filled 2021-07-08: qty 15, 5d supply, fill #0

## 2021-07-09 ENCOUNTER — Other Ambulatory Visit: Payer: Self-pay

## 2021-07-12 DIAGNOSIS — G4733 Obstructive sleep apnea (adult) (pediatric): Secondary | ICD-10-CM | POA: Diagnosis not present

## 2021-07-12 DIAGNOSIS — Z9989 Dependence on other enabling machines and devices: Secondary | ICD-10-CM | POA: Diagnosis not present

## 2021-07-14 ENCOUNTER — Other Ambulatory Visit: Payer: Self-pay

## 2021-07-14 DIAGNOSIS — Z9989 Dependence on other enabling machines and devices: Secondary | ICD-10-CM | POA: Diagnosis not present

## 2021-07-14 DIAGNOSIS — G4733 Obstructive sleep apnea (adult) (pediatric): Secondary | ICD-10-CM | POA: Diagnosis not present

## 2021-07-14 MED FILL — Pantoprazole Sodium EC Tab 40 MG (Base Equiv): ORAL | 90 days supply | Qty: 180 | Fill #0 | Status: AC

## 2021-08-02 ENCOUNTER — Other Ambulatory Visit: Payer: Self-pay

## 2021-08-02 MED ORDER — TOPIRAMATE 50 MG PO TABS
50.0000 mg | ORAL_TABLET | Freq: Two times a day (BID) | ORAL | 3 refills | Status: DC
  Filled 2021-08-02: qty 60, 30d supply, fill #0

## 2021-08-09 ENCOUNTER — Other Ambulatory Visit: Payer: Self-pay

## 2021-08-24 ENCOUNTER — Other Ambulatory Visit: Payer: Self-pay | Admitting: Internal Medicine

## 2021-08-24 ENCOUNTER — Other Ambulatory Visit: Payer: Self-pay

## 2021-08-24 DIAGNOSIS — Z1231 Encounter for screening mammogram for malignant neoplasm of breast: Secondary | ICD-10-CM

## 2021-08-25 ENCOUNTER — Other Ambulatory Visit: Payer: Self-pay

## 2021-08-26 ENCOUNTER — Other Ambulatory Visit: Payer: Self-pay

## 2021-08-26 MED ORDER — ISOSORBIDE MONONITRATE ER 30 MG PO TB24
ORAL_TABLET | Freq: Every day | ORAL | 3 refills | Status: AC
  Filled 2021-08-26: qty 90, 90d supply, fill #0

## 2021-08-31 ENCOUNTER — Other Ambulatory Visit: Payer: Self-pay

## 2021-09-10 ENCOUNTER — Other Ambulatory Visit: Payer: Self-pay

## 2021-09-13 ENCOUNTER — Telehealth: Payer: Self-pay | Admitting: Obstetrics and Gynecology

## 2021-09-13 NOTE — Telephone Encounter (Signed)
Rebekah Perkins called in and states she has been spotting at the end of the month for the last 3 months.  She says it is spotting but it keeps coming at the end of the month like a period and she states this isn't supposed to happen.  Please advise.

## 2021-09-16 ENCOUNTER — Other Ambulatory Visit: Payer: Self-pay

## 2021-09-16 ENCOUNTER — Ambulatory Visit: Payer: Medicare HMO | Admitting: Obstetrics and Gynecology

## 2021-09-16 ENCOUNTER — Encounter: Payer: Self-pay | Admitting: Obstetrics and Gynecology

## 2021-09-16 VITALS — BP 130/75 | HR 62 | Ht 63.0 in | Wt 267.6 lb

## 2021-09-16 DIAGNOSIS — N8189 Other female genital prolapse: Secondary | ICD-10-CM

## 2021-09-16 DIAGNOSIS — N950 Postmenopausal bleeding: Secondary | ICD-10-CM | POA: Diagnosis not present

## 2021-09-16 DIAGNOSIS — Z6841 Body Mass Index (BMI) 40.0 and over, adult: Secondary | ICD-10-CM | POA: Diagnosis not present

## 2021-09-16 NOTE — Progress Notes (Signed)
HPI:      [name] is a 65 y.o. [phone] who LMP was No LMP recorded. Patient is postmenopausal.  Subjective:   She presents today stating that over the last 3 months she has experienced some spotting at the end of each month.  She reports it is not heavy. Several years ago patient underwent endometrial biopsy showing no hyperplasia or malignancy.  She was then on cyclic progesterone for a few years.  She stopped progesterone approximately a year and a half ago.  At the time of discontinuation she had no withdrawal bleed from the progesterone. A significant note patient has a history of obesity, hypertension, and sleep apnea. Her sister was recently diagnosed with breast cancer.     Hx: The following portions of the patient's history were reviewed and updated as appropriate:             She  has a past medical history of Anxiety, Apnea, Complication of anesthesia, Diverticulitis, Environmental allergies, Hypertension, Menopausal state, Obesity, PMB (postmenopausal bleeding), PVD (peripheral vascular disease) ([place]), Rosacea, Simple endometrial hyperplasia, Sleep apnea, Thyroid disease, and Urinary urgency. She does not have any pertinent problems on file. She  has a past surgical history that includes Dilation and curettage of uterus; Hysteroscopy; Cholecystectomy; Esophagogastroduodenoscopy (egd) with propofol (N/A, 02/03/2021); and Colonoscopy with propofol (N/A, 05/05/2021). Her family history includes Breast cancer in her sister; Breast cancer (age of onset: 33) in her maternal aunt; Breast cancer (age of onset: 30) in her maternal grandmother; Cancer in her maternal grandmother and sister; Colon cancer in her maternal grandmother; Diabetes in her mother and sister; Heart disease in her mother; Lung cancer in her maternal aunt. She  reports that she is a non-smoker but has been exposed to tobacco smoke. She has never used smokeless tobacco. She reports that she does not drink alcohol and  does not use drugs. She has a current medication list which includes the following prescription(s): aspirin ec, bisoprolol-hydrochlorothiazide, calcium carbonate, celecoxib, cetirizine, docusate sodium, fluticasone, gabapentin, hydrochlorothiazide, isosorbide mononitrate, levothyroxine, methylcobalamin, pantoprazole, potassium, trazodone, vitamin b-12, bisoprolol-hydrochlorothiazide, cefdinir, celecoxib, famotidine, fluticasone, gabapentin, gabapentin, gabapentin, levothyroxine, levothyroxine, levothyroxine, naproxen, tizanidine, topiramate, and [DISCONTINUED] phentermine. She is allergic to azithromycin and penicillins.       Review of Systems:  Review of Systems  Constitutional: Denied constitutional symptoms, night sweats, recent illness, fatigue, fever, insomnia and weight loss.  Eyes: Denied eye symptoms, eye pain, photophobia, vision change and visual disturbance.  Ears/Nose/Throat/Neck: Denied ear, nose, throat or neck symptoms, hearing loss, nasal discharge, sinus congestion and sore throat.  Cardiovascular: Denied cardiovascular symptoms, arrhythmia, chest pain/pressure, edema, exercise intolerance, orthopnea and palpitations.  Respiratory: Denied pulmonary symptoms, asthma, pleuritic pain, productive sputum, cough, dyspnea and wheezing.  Gastrointestinal: Denied, gastro-esophageal reflux, melena, nausea and vomiting.  Genitourinary: See HPI for additional information.  Musculoskeletal: Denied musculoskeletal symptoms, stiffness, swelling, muscle weakness and myalgia.  Dermatologic: Denied dermatology symptoms, rash and scar.  Neurologic: Denied neurology symptoms, dizziness, headache, neck pain and syncope.  Psychiatric: Denied psychiatric symptoms, anxiety and depression.  Endocrine: Denied endocrine symptoms including hot flashes and night sweats.   Meds:   Current Outpatient Medications on File Prior to Visit  Medication Sig Dispense Refill   aspirin EC 81 MG tablet Take 81 mg by  mouth daily.     bisoprolol-hydrochlorothiazide (ZIAC) 5-6.25 MG tablet Take 1 tablet by mouth once daily 90 tablet 1   calcium carbonate (OS-CAL) 600 MG TABS tablet Take 600 mg by mouth at bedtime.  celecoxib (CELEBREX) 200 MG capsule TAKE 1 CAPSULE BY MOUTH TWICE DAILY 60 capsule 2   cetirizine (ZYRTEC) 10 MG tablet Take 10 mg by mouth at bedtime.     docusate sodium (COLACE) 100 MG capsule Take 100 mg by mouth 2 (two) times daily.     fluticasone (FLONASE) 50 MCG/ACT nasal spray Place 2 sprays into both nostrils once daily 16 g 11   gabapentin (NEURONTIN) 300 MG capsule TAKE 1 CAPSULE BY MOUTH NIGHTLY 90 capsule 1   hydrochlorothiazide (HYDRODIURIL) 25 MG tablet TAKE 1 TABLET BY MOUTH ONCE DAILY 90 tablet 2   isosorbide mononitrate (IMDUR) 30 MG 24 hr tablet TAKE 1 TABLET BY MOUTH DAILY 90 tablet 3   levothyroxine (SYNTHROID) 150 MCG tablet TAKE 1 TABLET BY MOUTH ON AN EMPTY STOMACH WITH A GLASS OF WATER AT LEAST 30-60 MINUTES BEFORE BREAKFAST ONCE DAILY 90 tablet 1   Methylcobalamin 1 MG CHEW Chew 1 tablet by mouth daily.     pantoprazole (PROTONIX) 40 MG tablet TAKE 1 TABLET BY MOUTH TWICE DAILY BEFORE A MEAL. 60 tablet 2   Potassium 99 MG TABS Take by mouth in the morning and at bedtime.     traZODone (DESYREL) 50 MG tablet TAKE 1 TABLET (50 MG TOTAL) BY MOUTH NIGHTLY 90 tablet 3   vitamin B-12 (CYANOCOBALAMIN) 1000 MCG tablet Take 1,000 mcg by mouth daily.     bisoprolol-hydrochlorothiazide (ZIAC) 5-6.25 MG tablet Take 1 tablet by mouth once daily 90 tablet 1   cefdinir (OMNICEF) 300 MG capsule Take 1 capsule (300 mg total) by mouth 2 (two) times daily for 7 days 14 capsule 0   celecoxib (CELEBREX) 200 MG capsule Take 1 capsule (200 mg total) by mouth 2 (two) times daily (Patient not taking: Reported on 09/16/2021) 180 capsule 1   famotidine (PEPCID) 40 MG tablet TAKE 1 TABLET (40 MG TOTAL) BY MOUTH NIGHTLY 90 tablet 3   fluticasone (FLONASE) 50 MCG/ACT nasal spray Place 2 sprays into  both nostrils daily. (Patient not taking: Reported on 09/16/2021) 16 g 9   gabapentin (NEURONTIN) 300 MG capsule Take 1 capsule (300 mg total) by mouth nightly 90 capsule 1   gabapentin (NEURONTIN) 300 MG capsule Take 1 capsule by mouth nightly (Patient not taking: Reported on 09/16/2021) 90 capsule 1   gabapentin (NEURONTIN) 300 MG capsule Take 1 capsule (300 mg total) by mouth at bedtime 90 capsule 1   levothyroxine (SYNTHROID) 150 MCG tablet Take 1 tablet (150 mcg total) by mouth once daily for 30 days Take on an empty stomach with a glass of water at least 30-60 minutes before breakfast. 90 tablet 1   levothyroxine (SYNTHROID) 150 MCG tablet Take 1 tablet by mouth once daily on an empty stomach eith a glass of water 30-60 minutes before breakfast (Patient not taking: Reported on 09/16/2021) 90 tablet 1   levothyroxine (SYNTHROID) 150 MCG tablet Take 1 tablet (150 mcg total) by mouth once daily. Take on an empty stomach with a glass of water at least 30-60 minutes before breakfast. (Patient not taking: Reported on 09/16/2021) 90 tablet 1   naproxen (NAPROSYN) 500 MG tablet Take 1 tablet (500 mg total) by mouth 2 (two) times daily as needed for up to 7 days 14 tablet 0   tiZANidine (ZANAFLEX) 2 MG tablet Take 1 tablet (2 mg total) by mouth 3 (three) times daily as needed for up to 5 days 15 tablet 0   topiramate (TOPAMAX) 50 MG tablet Take 1 tablet (50  mg total) by mouth 2 (two) times daily 60 tablet 3   [DISCONTINUED] phentermine (ADIPEX-P) 37.5 MG tablet Take 1 tablet (37.5 mg total) by mouth once daily 90 tablet 1   No current facility-administered medications on file prior to visit.      Objective:     Vitals:   09/16/21 1240  BP: 130/75  Pulse: 62   Filed Weights   09/16/21 1240  Weight: 267 lb 9.6 oz (121.4 kg)                        Assessment:    [id number] Patient Active Problem List   Diagnosis Date Noted   Screening for colon cancer    Chronic GERD    BMI 45.0-49.9,  adult ([place]) 12/03/2019   Endometrial hyperplasia without atypia, simple 07/21/2015   Diverticulosis 07/21/2015   Neuritis or radiculitis due to rupture of lumbar intervertebral disc 11/12/2014   Lumbar canal stenosis 11/12/2014   Bursitis, trochanteric 11/12/2014   Back ache 04/10/2014   Chronic anemia 04/10/2014   BP (high blood pressure) 04/10/2014   Blood glucose elevated 04/10/2014   HLD (hyperlipidemia) 04/10/2014   Adult hypothyroidism 04/10/2014   Adiposity 04/10/2014   Obstructive apnea 04/10/2014     1. Postmenopausal bleeding   2. Morbid obesity with BMI of 45.0-49.9, adult (HCC)   3. Pelvic relaxation disorder     As patient has had a previous endometrial biopsy and been on progesterone for several years her risk for endometrial cancer is somewhat mitigated.  Hyperplasia remains a possibility, but the most likely diagnosis is atrophic vaginitis.   Plan:            1.  We have discussed postmenopausal bleeding and the work-up which could include ultrasound for endometrial thickness and endometrial biopsy.  Patient has chosen to go with endometrial thickness first.  Orders Orders Placed This Encounter  Procedures   US Transvaginal Non-OB   US Pelvis Complete    No orders of the defined types were placed in this encounter.     F/U  Return for We will contact her with any abnormal test results. I spent 24 minutes involved in the care of this patient preparing to see the patient by obtaining and reviewing her medical history (including labs, imaging tests and prior procedures), documenting clinical information in the electronic health record (EHR), counseling and coordinating care plans, writing and sending prescriptions, ordering tests or procedures and in direct communicating with the patient and medical staff discussing pertinent items from her history and physical exam.  Finis Bud, M.D. 09/16/2021 1:04 PM

## 2021-10-18 ENCOUNTER — Other Ambulatory Visit: Payer: Self-pay | Admitting: Obstetrics and Gynecology

## 2021-10-18 ENCOUNTER — Other Ambulatory Visit: Payer: Self-pay

## 2021-10-18 ENCOUNTER — Ambulatory Visit (INDEPENDENT_AMBULATORY_CARE_PROVIDER_SITE_OTHER): Payer: Medicare HMO

## 2021-10-18 DIAGNOSIS — N950 Postmenopausal bleeding: Secondary | ICD-10-CM

## 2021-10-20 ENCOUNTER — Ambulatory Visit
Admission: RE | Admit: 2021-10-20 | Discharge: 2021-10-20 | Disposition: A | Discharge status: Home / Self Care | Payer: Medicare HMO | Source: Ambulatory Visit | Attending: Internal Medicine | Admitting: Internal Medicine

## 2021-10-20 ENCOUNTER — Other Ambulatory Visit: Payer: Self-pay

## 2021-10-20 DIAGNOSIS — Z1231 Encounter for screening mammogram for malignant neoplasm of breast: Secondary | ICD-10-CM | POA: Diagnosis present

## 2021-10-25 NOTE — Progress Notes (Signed)
This is considered a normal ultrasound for a woman in menopause.  There is no evidence of overgrowth of the lining of the uterus or of cancer.  This is an excellent result and you need no further work-up at this time.

## 2021-11-22 ENCOUNTER — Telehealth: Payer: Self-pay | Admitting: Obstetrics and Gynecology

## 2021-11-22 NOTE — Telephone Encounter (Signed)
Pt is calling in stating that she is still bleeding and wanted to know what kind of cream that he wanted her to get for the continuous bleeding that she is having.  Pt is aware that the provider is not in the office today.  Pt would like to have a call back.

## 2021-11-24 ENCOUNTER — Other Ambulatory Visit: Payer: Self-pay | Admitting: Obstetrics and Gynecology

## 2021-11-24 DIAGNOSIS — N950 Postmenopausal bleeding: Secondary | ICD-10-CM

## 2021-11-24 MED ORDER — ESTRADIOL 0.1 MG/GM VA CREA: 0.2500 | TOPICAL_CREAM | VAGINAL | 0 refills | Status: AC

## 2021-11-26 NOTE — Telephone Encounter (Signed)
Patient did pick up medication, she started it yesterday. She said it burned a little when she put it in. She continues to have the vaginal bleeding.

## 2021-12-01 NOTE — Telephone Encounter (Signed)
Patient called.  Patient aware.  

## 2021-12-15 ENCOUNTER — Other Ambulatory Visit: Payer: Self-pay | Admitting: Orthopedic Surgery

## 2021-12-15 ENCOUNTER — Other Ambulatory Visit (HOSPITAL_COMMUNITY): Payer: Self-pay | Admitting: Orthopedic Surgery

## 2021-12-15 DIAGNOSIS — M1711 Unilateral primary osteoarthritis, right knee: Secondary | ICD-10-CM

## 2021-12-31 ENCOUNTER — Other Ambulatory Visit: Payer: Self-pay

## 2021-12-31 ENCOUNTER — Ambulatory Visit
Admission: RE | Admit: 2021-12-31 | Discharge: 2021-12-31 | Disposition: A | Discharge status: Home / Self Care | Payer: Medicare HMO | Source: Ambulatory Visit | Attending: Orthopedic Surgery | Admitting: Orthopedic Surgery

## 2021-12-31 DIAGNOSIS — M1711 Unilateral primary osteoarthritis, right knee: Secondary | ICD-10-CM | POA: Insufficient documentation

## 2022-01-28 ENCOUNTER — Encounter: Payer: 59 | Admitting: Obstetrics and Gynecology

## 2022-02-03 ENCOUNTER — Other Ambulatory Visit: Payer: Self-pay | Admitting: Orthopedic Surgery

## 2022-02-04 ENCOUNTER — Other Ambulatory Visit (HOSPITAL_COMMUNITY)
Admission: RE | Admit: 2022-02-04 | Discharge: 2022-02-04 | Disposition: A | Discharge status: Home / Self Care | Payer: Medicare HMO | Source: Ambulatory Visit | Attending: Obstetrics and Gynecology | Admitting: Obstetrics and Gynecology

## 2022-02-04 ENCOUNTER — Encounter: Payer: Self-pay | Admitting: Obstetrics and Gynecology

## 2022-02-04 ENCOUNTER — Other Ambulatory Visit: Payer: Self-pay

## 2022-02-04 ENCOUNTER — Ambulatory Visit (INDEPENDENT_AMBULATORY_CARE_PROVIDER_SITE_OTHER): Payer: Medicare HMO | Admitting: Obstetrics and Gynecology

## 2022-02-04 VITALS — BP 119/81 | HR 57 | Ht 63.0 in | Wt 280.3 lb

## 2022-02-04 DIAGNOSIS — N950 Postmenopausal bleeding: Secondary | ICD-10-CM | POA: Insufficient documentation

## 2022-02-04 DIAGNOSIS — Z01419 Encounter for gynecological examination (general) (routine) without abnormal findings: Secondary | ICD-10-CM | POA: Diagnosis not present

## 2022-02-04 DIAGNOSIS — N9089 Other specified noninflammatory disorders of vulva and perineum: Secondary | ICD-10-CM | POA: Diagnosis present

## 2022-02-04 NOTE — Progress Notes (Signed)
Patients presents for annual exam today. She  states she is having issues with vaginal dryness and vaginal bleeding as well. Patient is up to date on mammogram, colonoscopy and pap smear, labs declined. Patient states no other questions or concerns at this time.  ? ?

## 2022-02-04 NOTE — Addendum Note (Signed)
Addended by: Marykay Lex on: 02/04/2022 09:19 AM ? ? Modules accepted: Orders ? ?

## 2022-02-04 NOTE — Progress Notes (Signed)
HPI: ?     [name] is a 66 y.o. [phone] who LMP was No LMP recorded. Patient is postmenopausal. ? ?Subjective:  ? ?She presents today for her annual examination.  She states that she was doing well since last year but in February she had 3 to 4 days of vaginal bleeding "almost like a period".  Then again early in March she had 1 day of bleeding.  She says she feels like "something is tearing down there". ?She continues to use estrogen vaginal cream every other day. ? ?  Hx: ?The following portions of the patient's history were reviewed and updated as appropriate: ?            She  has a past medical history of Anxiety, Apnea, Complication of anesthesia, Diverticulitis, Environmental allergies, Hypertension, Menopausal state, Obesity, PMB (postmenopausal bleeding), PVD (peripheral vascular disease) ([place]), Rosacea, Simple endometrial hyperplasia, Sleep apnea, Thyroid disease, and Urinary urgency. ?She does not have any pertinent problems on file. ?She  has a past surgical history that includes Dilation and curettage of uterus; Hysteroscopy; Cholecystectomy; Esophagogastroduodenoscopy (egd) with propofol (N/A, 02/03/2021); and Colonoscopy with propofol (N/A, 05/05/2021). ?Her family history includes Breast cancer in her sister; Breast cancer (age of onset: 35) in her maternal aunt; Breast cancer (age of onset: 58) in her maternal grandmother; Cancer in her maternal grandmother and sister; Colon cancer in her maternal grandmother; Diabetes in her mother and sister; Heart disease in her mother; Lung cancer in her maternal aunt. ?She  reports that she has never smoked. She has been exposed to tobacco smoke. She has never used smokeless tobacco. She reports that she does not drink alcohol and does not use drugs. ?She has a current medication list which includes the following prescription(s): aspirin ec, bisoprolol-hydrochlorothiazide, calcium carbonate, celecoxib, cetirizine, docusate sodium, estradiol,  fluticasone, gabapentin, hydrochlorothiazide, isosorbide mononitrate, levothyroxine, potassium, vitamin b-12, gabapentin, gabapentin, pantoprazole, trazodone, and [DISCONTINUED] phentermine. ?She is allergic to azithromycin and penicillins. ?      ?Review of Systems:  ?Review of Systems ? ?Constitutional: Denied constitutional symptoms, night sweats, recent illness, fatigue, fever, insomnia and weight loss.  ?Eyes: Denied eye symptoms, eye pain, photophobia, vision change and visual disturbance.  ?Ears/Nose/Throat/Neck: Denied ear, nose, throat or neck symptoms, hearing loss, nasal discharge, sinus congestion and sore throat.  ?Cardiovascular: Denied cardiovascular symptoms, arrhythmia, chest pain/pressure, edema, exercise intolerance, orthopnea and palpitations.  ?Respiratory: Denied pulmonary symptoms, asthma, pleuritic pain, productive sputum, cough, dyspnea and wheezing.  ?Gastrointestinal: Denied, gastro-esophageal reflux, melena, nausea and vomiting.  ?Genitourinary: See HPI for additional information.  ?Musculoskeletal: Denied musculoskeletal symptoms, stiffness, swelling, muscle weakness and myalgia.  ?Dermatologic: Denied dermatology symptoms, rash and scar.  ?Neurologic: Denied neurology symptoms, dizziness, headache, neck pain and syncope.  ?Psychiatric: Denied psychiatric symptoms, anxiety and depression.  ?Endocrine: Denied endocrine symptoms including hot flashes and night sweats.  ? ?Meds: ?  ?Current Outpatient Medications on File Prior to Visit  ?Medication Sig Dispense Refill  ? aspirin EC 81 MG tablet Take 81 mg by mouth daily.    ? bisoprolol-hydrochlorothiazide (ZIAC) 5-6.25 MG tablet Take 1 tablet by mouth once daily 90 tablet 1  ? calcium carbonate (OS-CAL) 600 MG TABS tablet Take 600 mg by mouth at bedtime.    ? celecoxib (CELEBREX) 200 MG capsule Take 1 capsule (200 mg total) by mouth 2 (two) times daily 180 capsule 1  ? cetirizine (ZYRTEC) 10 MG tablet Take 10 mg by mouth at bedtime.    ?  docusate  sodium (COLACE) 100 MG capsule Take 100 mg by mouth 2 (two) times daily.    ? estradiol (ESTRACE) 0.1 MG/GM vaginal cream Place 1.61 Applicatorfuls vaginally 3 (three) times a week. 90 g 0  ? fluticasone (FLONASE) 50 MCG/ACT nasal spray Place 2 sprays into both nostrils daily. 16 g 9  ? gabapentin (NEURONTIN) 300 MG capsule Take 1 capsule by mouth nightly 90 capsule 1  ? hydrochlorothiazide (HYDRODIURIL) 25 MG tablet TAKE 1 TABLET BY MOUTH ONCE DAILY 90 tablet 2  ? isosorbide mononitrate (IMDUR) 30 MG 24 hr tablet TAKE 1 TABLET BY MOUTH DAILY 90 tablet 3  ? levothyroxine (SYNTHROID) 150 MCG tablet Take 1 tablet (150 mcg total) by mouth once daily. Take on an empty stomach with a glass of water at least 30-60 minutes before breakfast. 90 tablet 1  ? Potassium 99 MG TABS Take by mouth in the morning and at bedtime.    ? vitamin B-12 (CYANOCOBALAMIN) 1000 MCG tablet Take 1,000 mcg by mouth daily.    ? gabapentin (NEURONTIN) 300 MG capsule Take 1 capsule (300 mg total) by mouth nightly 90 capsule 1  ? gabapentin (NEURONTIN) 300 MG capsule Take 1 capsule (300 mg total) by mouth at bedtime 90 capsule 1  ? pantoprazole (PROTONIX) 40 MG tablet TAKE 1 TABLET BY MOUTH TWICE DAILY BEFORE A MEAL. 60 tablet 2  ? traZODone (DESYREL) 50 MG tablet TAKE 1 TABLET (50 MG TOTAL) BY MOUTH NIGHTLY 90 tablet 3  ? [DISCONTINUED] phentermine (ADIPEX-P) 37.5 MG tablet Take 1 tablet (37.5 mg total) by mouth once daily 90 tablet 1  ? ?No current facility-administered medications on file prior to visit.  ? ? ? ? ?Objective:  ?  ? ?Vitals:  ? 02/04/22 0808  ?BP: 119/81  ?Pulse: (!) 57  ?  ?Filed Weights  ? 02/04/22 0960  ?Weight: 280 lb 4.8 oz (127.1 kg)  ? ?  ?         Physical examination ?General NAD, Conversant  ?HEENT Atraumatic; Op clear with mmm.  Normo-cephalic. Pupils reactive. Anicteric sclerae  ?Thyroid/Neck Smooth without nodularity or enlargement. Normal ROM.  Neck Supple.  ?Skin No rashes, lesions or ulceration. Normal  palpated skin turgor. No nodularity.  ?Breasts: No masses or discharge.  Symmetric.  No axillary adenopathy.  ?Lungs: Clear to auscultation.No rales or wheezes. Normal Respiratory effort, no retractions.  ?Heart: NSR.  No murmurs or rubs appreciated. No periferal edema  ?Abdomen: Soft.  Non-tender.  No masses.  No HSM. No hernia  ?Extremities: Moves all appropriately.  Normal ROM for age. No lymphadenopathy.  ?Neuro: Oriented to PPT.  Normal mood. Normal affect.  ? ?  Pelvic:   ?Vulva: Posterior fourchette with large apparently kissing lesion.  Very friable.  Erythematous.  ?Vagina: No lesions or abnormalities noted.  ?Support: Normal pelvic support.  ?Urethra No masses tenderness or scarring.  ?Meatus Normal size without lesions or prolapse.  ?Cervix: Normal appearance.  No lesions.  ?Anus: Normal exam.  No lesions.  ?Perineum: Normal exam.  No lesions.  ?      Bimanual   ?Uterus: Normal size.  Non-tender.  Mobile.  AV.  ?Adnexae: No masses.  Non-tender to palpation.  ?Cul-de-sac: Negative for abnormality.  ? ?Vulvar biopsy ? Vulvar skin cleansed in the usual manner  ?1% lidocaine with epinephrine injected.  Biopsy of left lesion performed. ?Silver nitrate applied.  Entire lesion friable. ? ?Assessment:  ?  ?[id number] ?Patient Active Problem List  ? Diagnosis Date Noted  ?  Screening for colon cancer   ? Chronic GERD   ? BMI 45.0-49.9, adult ([place]) 12/03/2019  ? Endometrial hyperplasia without atypia, simple 07/21/2015  ? Diverticulosis 07/21/2015  ? Neuritis or radiculitis due to rupture of lumbar intervertebral disc 11/12/2014  ? Lumbar canal stenosis 11/12/2014  ? Bursitis, trochanteric 11/12/2014  ? Back ache 04/10/2014  ? Chronic anemia 04/10/2014  ? BP (high blood pressure) 04/10/2014  ? Blood glucose elevated 04/10/2014  ? HLD (hyperlipidemia) 04/10/2014  ? Adult hypothyroidism 04/10/2014  ? Adiposity 04/10/2014  ? Obstructive apnea 04/10/2014  ? ?  ?1. Well woman exam with routine gynecological exam   ?2.  Postmenopausal bleeding   ?3. Vulvar lesion   ? ?  ? ? ?Plan:  ?  ?       ? 1.  Basic Screening Recommendations ?The basic screening recommendations for asymptomatic women were discussed with the patient during her vis

## 2022-02-07 LAB — SURGICAL PATHOLOGY

## 2022-02-14 ENCOUNTER — Encounter (HOSPITAL_COMMUNITY): Payer: Self-pay | Admitting: Urgent Care

## 2022-02-14 ENCOUNTER — Other Ambulatory Visit
Admission: RE | Admit: 2022-02-14 | Discharge: 2022-02-14 | Disposition: A | Discharge status: Home / Self Care | Payer: Medicare HMO | Source: Ambulatory Visit | Attending: Orthopedic Surgery | Admitting: Orthopedic Surgery

## 2022-02-14 ENCOUNTER — Encounter: Payer: Self-pay | Admitting: Obstetrics and Gynecology

## 2022-02-14 ENCOUNTER — Other Ambulatory Visit: Payer: Self-pay | Admitting: Obstetrics and Gynecology

## 2022-02-14 ENCOUNTER — Other Ambulatory Visit: Payer: Self-pay

## 2022-02-14 DIAGNOSIS — R8290 Unspecified abnormal findings in urine: Secondary | ICD-10-CM | POA: Diagnosis not present

## 2022-02-14 DIAGNOSIS — Z01818 Encounter for other preprocedural examination: Secondary | ICD-10-CM | POA: Insufficient documentation

## 2022-02-14 DIAGNOSIS — C519 Malignant neoplasm of vulva, unspecified: Secondary | ICD-10-CM

## 2022-02-14 LAB — TYPE AND SCREEN
ABO/RH(D): B POS
Antibody Screen: NEGATIVE

## 2022-02-14 LAB — URINALYSIS, ROUTINE W REFLEX MICROSCOPIC
Bacteria, UA: NONE SEEN
Bilirubin Urine: NEGATIVE
Glucose, UA: NEGATIVE mg/dL
Hgb urine dipstick: NEGATIVE
Ketones, ur: NEGATIVE mg/dL
Nitrite: NEGATIVE
Protein, ur: NEGATIVE mg/dL
Specific Gravity, Urine: 1.016 (ref 1.005–1.030)
pH: 5 (ref 5.0–8.0)

## 2022-02-14 LAB — CBC WITH DIFFERENTIAL/PLATELET
Abs Immature Granulocytes: 0.04 10*3/uL (ref 0.00–0.07)
Basophils Absolute: 0.1 10*3/uL (ref 0.0–0.1)
Basophils Relative: 1 %
Eosinophils Absolute: 0.3 10*3/uL (ref 0.0–0.5)
Eosinophils Relative: 3 %
HCT: 36.8 % (ref 36.0–46.0)
Hemoglobin: 11.7 g/dL — ABNORMAL LOW (ref 12.0–15.0)
Immature Granulocytes: 1 %
Lymphocytes Relative: 20 %
Lymphs Abs: 1.7 10*3/uL (ref 0.7–4.0)
MCH: 25.9 pg — ABNORMAL LOW (ref 26.0–34.0)
MCHC: 31.8 g/dL (ref 30.0–36.0)
MCV: 81.6 fL (ref 80.0–100.0)
Monocytes Absolute: 0.6 10*3/uL (ref 0.1–1.0)
Monocytes Relative: 8 %
Neutro Abs: 5.7 10*3/uL (ref 1.7–7.7)
Neutrophils Relative %: 67 %
Platelets: 172 10*3/uL (ref 150–400)
RBC: 4.51 MIL/uL (ref 3.87–5.11)
RDW: 14.2 % (ref 11.5–15.5)
WBC: 8.4 10*3/uL (ref 4.0–10.5)
nRBC: 0 % (ref 0.0–0.2)

## 2022-02-14 LAB — COMPREHENSIVE METABOLIC PANEL
ALT: 20 U/L (ref 0–44)
AST: 20 U/L (ref 15–41)
Albumin: 3.8 g/dL (ref 3.5–5.0)
Alkaline Phosphatase: 58 U/L (ref 38–126)
Anion gap: 6 (ref 5–15)
BUN: 22 mg/dL (ref 8–23)
CO2: 31 mmol/L (ref 22–32)
Calcium: 9.2 mg/dL (ref 8.9–10.3)
Chloride: 101 mmol/L (ref 98–111)
Creatinine, Ser: 1.09 mg/dL — ABNORMAL HIGH (ref 0.44–1.00)
GFR, Estimated: 56 mL/min — ABNORMAL LOW (ref >60)
Glucose, Bld: 108 mg/dL — ABNORMAL HIGH (ref 70–99)
Potassium: 3.5 mmol/L (ref 3.5–5.1)
Sodium: 138 mmol/L (ref 135–145)
Total Bilirubin: 0.6 mg/dL (ref 0.3–1.2)
Total Protein: 8.1 g/dL (ref 6.5–8.1)

## 2022-02-14 LAB — SURGICAL PCR SCREEN
MRSA, PCR: NEGATIVE
Staphylococcus aureus: POSITIVE — AB

## 2022-02-14 NOTE — Patient Instructions (Addendum)
Your procedure is scheduled on: 02/22/2022 ?Report to the Registration Desk on the 1st floor of the [facility]. ?To find out your arrival time, please call [phone] between 1PM - 3PM on: 02/21/2022  ? ?REMEMBER: ?Instructions that are not followed completely may result in serious medical risk, up to and including death; or upon the discretion of your surgeon and anesthesiologist your surgery may need to be rescheduled. ? ?Do not eat food after midnight the night before surgery.  ?No gum chewing, lozengers or hard candies. ? ?You may however, drink CLEAR liquids up to 2 hours before you are scheduled to arrive for your surgery. Do not drink anything within 2 hours of your scheduled arrival time. ? ?Clear liquids include: ?- water  ?- apple juice without pulp ?- gatorade (not RED colors) ?- black coffee or tea (Do NOT add milk or creamers to the coffee or tea) ?Do NOT drink anything that is not on this list. ? ? ? ?In addition, your doctor has ordered for you to drink the provided  ?Ensure Pre-Surgery Clear Carbohydrate Drink  ? ?Drinking this carbohydrate drink up to two hours before surgery helps to reduce insulin resistance and improve patient outcomes. Please complete drinking 2 hours prior to scheduled arrival time. ? ?TAKE THESE MEDICATIONS THE MORNING OF SURGERY WITH A SIP OF WATER: ?Celebrex ?Imdur ?SYNTHROID ?Protonix- (take one the night before and one on the morning of surgery - helps to prevent nausea after surgery.) ?5. zyrtec ? ?Follow recommendations from Cardiologist, Pulmonologist or PCP regarding stopping Aspirin.  Remember the date on stopping aspirin's instruction from your Physician. Your pre- op RN will ask you this. ? ?One week prior to surgery: ?Stop Anti-inflammatories (NSAIDS) such as Advil, Aleve, Ibuprofen, Motrin, Naproxen, Naprosyn and Aspirin based products such as Excedrin, Goodys Powder, BC Powder. ? ?Stop ANY OVER THE COUNTER supplements until after surgery like calcium,  multivitamin and B-12 ?You may however, continue to take Tylenol if needed for pain up until the day of surgery. ? ?No Alcohol for 24 hours before or after surgery. ? ?No Smoking including e-cigarettes for 24 hours prior to surgery.  ?No chewable tobacco products for at least 6 hours prior to surgery.  ?No nicotine patches on the day of surgery. ? ?Do not use any "recreational" drugs for at least a week prior to your surgery.  ?Please be advised that the combination of cocaine and anesthesia may have negative outcomes, up to and including death. ?If you test positive for cocaine, your surgery will be cancelled. ? ?On the morning of surgery brush your teeth with toothpaste and water, you may rinse your mouth with mouthwash if you wish. ?Do not swallow any toothpaste or mouthwash. ? ?Use CHG Soap or wipes as directed on instruction sheet. ? ?Do not wear jewelry, make-up, hairpins, clips or nail polish. ? ?Do not wear lotions, powders, or perfumes.  ? ?Do not shave body from the neck down 48 hours prior to surgery just in case you cut yourself which could leave a site for infection.  ?Also, freshly shaved skin may become irritated if using the CHG soap. ? ?Contact lenses, hearing aids and dentures may not be worn into surgery. ? ?Do not bring valuables to the hospital. [facility] is not responsible for any missing/lost belongings or valuables.  ? ? ?Bring your C-PAP to the hospital with you in case you may have to spend the night.  ? ?Notify your doctor if there is any change in  your medical condition (cold, fever, infection). ? ?Wear comfortable clothing (specific to your surgery type) to the hospital. ? ?After surgery, you can help prevent lung complications by doing breathing exercises.  ?Take deep breaths and cough every 1-2 hours. Your doctor may order a device called an Incentive Spirometer to help you take deep breaths. ? ?If you are being admitted to the hospital overnight, leave your suitcase in the  car. ?After surgery it may be brought to your room. ? ?If you are being discharged the day of surgery, you will not be allowed to drive home. ?You will need a responsible adult (18 years or older) to drive you home and stay with you that night.  ? ?If you are taking public transportation, you will need to have a responsible adult (18 years or older) with you. ?Please confirm with your physician that it is acceptable to use public transportation.  ? ?Please call the [facility] at [phone] if you have any questions about these instructions. ? ?Surgery Visitation Policy: ? ?Patients undergoing a surgery or procedure may have two family members or support persons with them as long as the person is not COVID-19 positive or experiencing its symptoms.  ? ?Inpatient Visitation:   ? ?Visiting hours are 7 a.m. to 8 p.m. ?Up to four visitors are allowed at one time in a patient room, including children. The visitors may rotate out with other people during the day. One designated support person (adult) may remain overnight.  ?

## 2022-02-15 DIAGNOSIS — C519 Malignant neoplasm of vulva, unspecified: Secondary | ICD-10-CM | POA: Insufficient documentation

## 2022-02-15 NOTE — Progress Notes (Signed)
Gynecologic Oncology Consult Visit  ? ?Referring Provider: Dr Amalia Hailey ? ?Chief Concern: concern for vulvar cancer ? ?Subjective:  ?Rebekah Perkins is a 66 y.o. female who is seen in consultation from Dr. 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  ? ?She presented to Dr Amalia Hailey for her annual examination 02/04/22.  She states that she was doing well since last year but in February she had 3 to 4 days of vaginal bleeding "almost like a period".  Then again early in March she had 1 day of bleeding.  She says she feels like "something is tearing down there". She continues to use estrogen vaginal cream every other day.  Vulvar exam shows posterior fourchette with large apparently kissing lesion.  Very friable.  Erythematous. ?  ?Biopsy of vulva: At least intraepithelial squamous cell carcinoma (Carcinoma in-situ / VIN III). Invasive carcinoma cannot be excluded in the submitted biopsy due to superficial nature of the biopsy.  ? ?Problem List: ?Patient Active Problem List  ? Diagnosis Date Noted  ? Screening for colon cancer   ? Chronic GERD   ? BMI 45.0-49.9, adult ([place]) 12/03/2019  ? Endometrial hyperplasia without atypia, simple 07/21/2015  ? Diverticulosis 07/21/2015  ? Neuritis or radiculitis due to rupture of lumbar intervertebral disc 11/12/2014  ? Lumbar canal stenosis 11/12/2014  ? Bursitis, trochanteric 11/12/2014  ? Back ache 04/10/2014  ? Chronic anemia 04/10/2014  ? BP (high blood pressure) 04/10/2014  ? Blood glucose elevated 04/10/2014  ? HLD (hyperlipidemia) 04/10/2014  ? Adult hypothyroidism 04/10/2014  ? Adiposity 04/10/2014  ? Obstructive apnea 04/10/2014  ? ? ?Past Medical History: ?Past Medical History:  ?Diagnosis Date  ? Anxiety   ? Apnea   ? Complication of anesthesia   ? slow to wake up afterwards  ? Diverticulitis   ? Environmental allergies   ? Hypertension   ? Menopausal state   ? Obesity   ? PMB (postmenopausal bleeding)   ? PVD (peripheral vascular disease) ([place])   ? Rosacea   ? Simple endometrial  hyperplasia   ? Sleep apnea   ? Thyroid disease   ? Urinary urgency   ? ? ?Past Surgical History: ?Past Surgical History:  ?Procedure Laterality Date  ? CHOLECYSTECTOMY    ? COLONOSCOPY WITH PROPOFOL N/A 05/05/2021  ? Procedure: COLONOSCOPY WITH PROPOFOL;  Surgeon: Lin Landsman, MD;  Location[facility] ENDOSCOPY;  Service: Gastroenterology;  Laterality: N/A;  ? DILATION AND CURETTAGE OF UTERUS    ? ESOPHAGOGASTRODUODENOSCOPY (EGD) WITH PROPOFOL N/A 02/03/2021  ? Procedure: ESOPHAGOGASTRODUODENOSCOPY (EGD) WITH PROPOFOL;  Surgeon: Lin Landsman, MD;  Location: [facility] ENDOSCOPY;  Service: Gastroenterology;  Laterality: N/A;  ? HYSTEROSCOPY    ? ? ?OB History:  ?OB History  ?Gravida Para Term Preterm AB Living  ?'3 3 3     3  '$ ?SAB IAB Ectopic Multiple Live Births  ?        3  ?  ?# Outcome Date GA Lbr Len/2nd Weight Sex Delivery Anes PTL Lv  ?3 Term 1978   7 lb 1.6 oz (3.221 kg) M Vag-Spont   LIV  ?2 Term 1977   7 lb (3.175 kg) M Vag-Spont   LIV  ?1 Term 1975   7 lb 1.9 oz (3.23 kg) M Vag-Spont   LIV  ? ? ?Family History: ?Family History  ?Problem Relation Age of Onset  ? Diabetes Mother   ? Heart disease Mother   ? Diabetes Sister   ? Breast cancer Sister   ?  Cancer Sister   ? Breast cancer Maternal Aunt 60  ? Lung cancer Maternal Aunt   ? Cancer Maternal Grandmother   ? Colon cancer Maternal Grandmother   ? Breast cancer Maternal Grandmother 73  ? Ovarian cancer Neg Hx   ? ? ?Social History: ?Social History  ? ?Socioeconomic History  ? Marital status: Divorced  ?  Spouse name: Not on file  ? Number of children: Not on file  ? Years of education: Not on file  ? Highest education level: Not on file  ?Occupational History  ? Not on file  ?Tobacco Use  ? Smoking status: Never  ?  Passive exposure: Yes  ? Smokeless tobacco: Never  ?Vaping Use  ? Vaping Use: Never used  ?Substance and Sexual Activity  ? Alcohol use: No  ? Drug use: No  ? Sexual activity: Not Currently  ?Other Topics Concern  ? Not on file  ?Social  History Narrative  ? Lives alone at home.   ? ?Social Determinants of Health  ? ?Financial Resource Strain: Not on file  ?Food Insecurity: Not on file  ?Transportation Needs: Not on file  ?Physical Activity: Not on file  ?Stress: Not on file  ?Social Connections: Not on file  ?Intimate Partner Violence: Not on file  ? ? ?Allergies: ?Allergies  ?Allergen Reactions  ? Azithromycin Itching  ? Penicillins Itching  ? ? ?Current Medications: ?Current Outpatient Medications  ?Medication Sig Dispense Refill  ? aspirin EC 81 MG tablet Take 81 mg by mouth daily.    ? bisoprolol-hydrochlorothiazide (ZIAC) 5-6.25 MG tablet Take 1 tablet by mouth once daily 90 tablet 1  ? calcium carbonate (OS-CAL) 600 MG TABS tablet Take 600 mg by mouth at bedtime.    ? celecoxib (CELEBREX) 200 MG capsule Take 1 capsule (200 mg total) by mouth 2 (two) times daily 180 capsule 1  ? cetirizine (ZYRTEC) 10 MG tablet Take 10 mg by mouth at bedtime.    ? docusate sodium (COLACE) 100 MG capsule Take 100 mg by mouth 2 (two) times daily.    ? estradiol (ESTRACE) 0.1 MG/GM vaginal cream Place 6.43 Applicatorfuls vaginally 3 (three) times a week. 90 g 0  ? fluticasone (FLONASE) 50 MCG/ACT nasal spray Place 2 sprays into both nostrils daily. 16 g 9  ? gabapentin (NEURONTIN) 300 MG capsule Take 1 capsule by mouth nightly 90 capsule 1  ? hydrochlorothiazide (HYDRODIURIL) 25 MG tablet TAKE 1 TABLET BY MOUTH ONCE DAILY 90 tablet 2  ? isosorbide mononitrate (IMDUR) 30 MG 24 hr tablet TAKE 1 TABLET BY MOUTH DAILY 90 tablet 3  ? levothyroxine (SYNTHROID) 150 MCG tablet Take 1 tablet (150 mcg total) by mouth once daily. Take on an empty stomach with a glass of water at least 30-60 minutes before breakfast. 90 tablet 1  ? Multiple Vitamins-Minerals (PRESERVISION AREDS 2+MULTI VIT PO) Take 1 capsule by mouth in the morning and at bedtime.    ? pantoprazole (PROTONIX) 40 MG tablet Take 40 mg by mouth 2 (two) times daily.    ? Potassium 99 MG TABS Take 99 mg by mouth  in the morning and at bedtime.    ? traZODone (DESYREL) 50 MG tablet Take 50 mg by mouth at bedtime.    ? vitamin B-12 (CYANOCOBALAMIN) 1000 MCG tablet Take 1,000 mcg by mouth daily.    ? ?No current facility-administered medications for this visit.  ? ? ?Review of Systems ?General: negative for, fevers, chills, fatigue, changes in sleep, changes in  weight or appetite ?Skin: negative for ?changes in color, texture, moles or lesions ?Eyes: negative for, changes in vision, pain, diplopia ?HEENT: negative for, change in hearing, pain, discharge, tinnitus, vertigo, voice changes, sore throat, neck masses ?Breasts: negative for breast lumps ?Pulmonary: negative for, dyspnea, orthopnea, productive cough ?Cardiac: negative for, palpitations, syncope, pain, discomfort, pressure ?Gastrointestinal: negative for, dysphagia, nausea, vomiting, jaundice, pain, constipation, diarrhea, hematemesis, hematochezia ?Genitourinary/Sexual: negative for, dysuria, discharge, hesitancy, nocturia, retention, stones, infections, STD's, incontinence ?Musculoskeletal: negative for, pain, stiffness, swelling, range of motion limitation ?Hematology: No coagulation disorder, negative for, easy bruising, bleeding ?Neurologic/Psych: negative for, headaches, seizures, paralysis, weakness, tremor, change in gait, change in sensation, mood swings, depression, anxiety, change in memory ? ?Objective:  ?Physical Examination:  ?BP 120/62   Pulse 66   Temp 98.7 ?F (37.1 ?C)   Resp 20   Wt 280 lb 4.8 oz (127.1 kg)   SpO2 100%   BMI 49.65 kg/m?  ?  ?ECOG Performance Status: 1 - Symptomatic but completely ambulatory ? ?General appearance: alert, cooperative, and appears stated age ?HEENT:PERRLA and thyroid without masses ?Lymph node survey: non-palpable, axillary, inguinal, supraclavicular ?Cardiovascular: regular rate and rhythm ?Respiratory: normal air entry, lungs clear to auscultation ?Abdomen: soft, non-tender, without masses or organomegaly, no  hernias, and well healed incision ?Back: inspection of back is normal ?Extremities: extremities normal, atraumatic, no cyanosis or edema ?Skin exam - normal coloration and turgor, no rashes, no suspicious skin les

## 2022-02-16 ENCOUNTER — Inpatient Hospital Stay: Payer: Medicare HMO | Attending: Obstetrics and Gynecology | Admitting: Obstetrics and Gynecology

## 2022-02-16 DIAGNOSIS — Z79899 Other long term (current) drug therapy: Secondary | ICD-10-CM | POA: Insufficient documentation

## 2022-02-16 DIAGNOSIS — Z803 Family history of malignant neoplasm of breast: Secondary | ICD-10-CM | POA: Insufficient documentation

## 2022-02-16 DIAGNOSIS — C519 Malignant neoplasm of vulva, unspecified: Secondary | ICD-10-CM

## 2022-02-16 DIAGNOSIS — Z800 Family history of malignant neoplasm of digestive organs: Secondary | ICD-10-CM | POA: Diagnosis not present

## 2022-02-16 DIAGNOSIS — D071 Carcinoma in situ of vulva: Secondary | ICD-10-CM | POA: Diagnosis not present

## 2022-02-16 DIAGNOSIS — Z801 Family history of malignant neoplasm of trachea, bronchus and lung: Secondary | ICD-10-CM | POA: Diagnosis not present

## 2022-02-16 DIAGNOSIS — Z809 Family history of malignant neoplasm, unspecified: Secondary | ICD-10-CM | POA: Insufficient documentation

## 2022-02-16 LAB — URINE CULTURE: Culture: NO GROWTH

## 2022-02-21 ENCOUNTER — Other Ambulatory Visit: Payer: Self-pay | Admitting: Obstetrics and Gynecology

## 2022-02-21 ENCOUNTER — Telehealth: Payer: Self-pay

## 2022-02-21 DIAGNOSIS — C519 Malignant neoplasm of vulva, unspecified: Secondary | ICD-10-CM

## 2022-02-21 LAB — SURGICAL PATHOLOGY

## 2022-02-21 NOTE — Telephone Encounter (Signed)
Pathology report for vulvar biopsy sent to Dr. Fransisca Connors for review. ?

## 2022-02-21 NOTE — Telephone Encounter (Signed)
Dr. Fransisca Connors has reviewed pathology from vulvar biopsy. Rebekah Perkins has been made aware of the diagnosis of invasive squamous cell carcinoma of the vulva. It has been recommended that she cancel her knee surgery. I have called and notified Rebekah Perkins regarding this. Dr. Rudene Christians was sent a message with this recommendation. Dr. Melvyn Novas will follow up with treatment plan. ?

## 2022-02-22 ENCOUNTER — Telehealth: Payer: Self-pay

## 2022-02-22 ENCOUNTER — Ambulatory Visit: Admission: RE | Admit: 2022-02-22 | Payer: Medicare HMO | Source: Home / Self Care | Admitting: Orthopedic Surgery

## 2022-02-22 DIAGNOSIS — Z419 Encounter for procedure for purposes other than remedying health state, unspecified: Secondary | ICD-10-CM

## 2022-02-22 SURGERY — ARTHROPLASTY, KNEE, TOTAL
Anesthesia: Choice | Site: Knee | Laterality: Right

## 2022-02-22 NOTE — Telephone Encounter (Signed)
Per Dr. Fransisca Connors, he would like for Ms. Osei to see Dr. Christel Mormon at [facility]. Notified Ms. Haff and referral sent. ?

## 2022-03-09 DIAGNOSIS — C519 Malignant neoplasm of vulva, unspecified: Secondary | ICD-10-CM | POA: Insufficient documentation

## 2022-07-06 ENCOUNTER — Inpatient Hospital Stay: Payer: Medicare HMO | Attending: Obstetrics and Gynecology | Admitting: Obstetrics and Gynecology

## 2022-07-06 VITALS — BP 116/76 | HR 63 | Temp 98.7°F | Resp 19 | Wt 267.1 lb

## 2022-07-06 DIAGNOSIS — Z923 Personal history of irradiation: Secondary | ICD-10-CM | POA: Insufficient documentation

## 2022-07-06 DIAGNOSIS — C519 Malignant neoplasm of vulva, unspecified: Secondary | ICD-10-CM | POA: Diagnosis present

## 2022-07-06 NOTE — Progress Notes (Signed)
Gynecologic Oncology Consult Visit   Referring Provider: Dr Amalia Hailey  Chief Concern: vulvar cancer  Subjective:  Rebekah Perkins is a 66 y.o. female who is seen in consultation from Dr. 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   She finished radiation a month ago.  Still tired, but vulva no longer sore.   Gyn Oncology history She presented to Dr Amalia Hailey for her annual examination 02/04/22.  She states that she was doing well since last year but in February she had 3 to 4 days of vaginal bleeding "almost like a period".  Then again early in March she had 1 day of bleeding.  She says she feels like "something is tearing down there". She continues to use estrogen vaginal cream every other day.  Vulvar exam shows posterior fourchette with large apparently kissing lesion.  Very friable.  Erythematous.   Biopsy of vulva: At least intraepithelial squamous cell carcinoma (Carcinoma in-situ / VIN III). Invasive carcinoma cannot be excluded in the submitted biopsy due to superficial nature of the biopsy.   Seen by Gyn Oncology  02/16/22 VULVA, LEFT; BIOPSY: INVASIVE SQUAMOUS CELL CARCINOMA, P16 POSITIVE.  Sections demonstrate an invasive poorly differentiated malignant neoplasm that focally undermines intact squamous mucosa. A limited panel of immunohistochemical stains was performed. The malignant neoplasm is positive for pancytokeratin, p63, and p16 (diffuse, block-like). This pattern of immunoreactivity supports the above diagnosis.   PET/CT 03/18/22 1. Focal uptake at the left posterior peritoneum/multiple compatible with known primary malignancy.  2. No evidence of metastatic disease.  3. Mildly enlarged thyroid gland with diffusely increased uptake suggestive of Graves' disease or thyroiditis. Correlate clinically.   In view of diffuse involvement of vulva with VIN and invasive cancer decision made to treat with radiation.   T[facility] Dr Christel Mormon at  [facility] --------------------------------------------------------------------------------------------------  Vulva, IMRT 10X 5040cGy 49 180cGy 03/30/22-05/16/22  --------------------------------------------------------------------------------------------------  Vulva boost, IMRT 10X 1600cGy 10 200cGy 05/18/22-05/27/22 --------------------------------------------------------------------------------------------------  Total dose 6640cGy 59   Problem List: Patient Active Problem List   Diagnosis Date Noted   Vulvar cancer, carcinoma ([place]) 02/15/2022   Screening for colon cancer    Chronic GERD    BMI 45.0-49.9, adult ([place]) 12/03/2019   Endometrial hyperplasia without atypia, simple 07/21/2015   Diverticulosis 07/21/2015   Neuritis or radiculitis due to rupture of lumbar intervertebral disc 11/12/2014   Lumbar canal stenosis 11/12/2014   Bursitis, trochanteric 11/12/2014   Back ache 04/10/2014   Chronic anemia 04/10/2014   BP (high blood pressure) 04/10/2014   Blood glucose elevated 04/10/2014   HLD (hyperlipidemia) 04/10/2014   Adult hypothyroidism 04/10/2014   Adiposity 04/10/2014   Obstructive apnea 04/10/2014    Past Medical History: Past Medical History:  Diagnosis Date   Anxiety    Apnea    Complication of anesthesia    slow to wake up afterwards   Diverticulitis    Environmental allergies    Hypertension    Menopausal state    Obesity    PMB (postmenopausal bleeding)    PVD (peripheral vascular disease) (HCC)    Rosacea    Simple endometrial hyperplasia    Sleep apnea    Thyroid disease    Urinary urgency     Past Surgical History: Past Surgical History:  Procedure Laterality Date   CHOLECYSTECTOMY     COLONOSCOPY WITH PROPOFOL N/A 05/05/2021   Procedure: COLONOSCOPY WITH PROPOFOL;  Surgeon: Lin Landsman, MD;  Location: [facility] ENDOSCOPY;  Service: Gastroenterology;  Laterality: N/A;   DILATION AND CURETTAGE OF UTERUS  ESOPHAGOGASTRODUODENOSCOPY (EGD) WITH  PROPOFOL N/A 02/03/2021   Procedure: ESOPHAGOGASTRODUODENOSCOPY (EGD) WITH PROPOFOL;  Surgeon: Lin Landsman, MD;  Location: [place];  Service: Gastroenterology;  Laterality: N/A;   HYSTEROSCOPY      OB History:  OB History  Gravida Para Term Preterm AB Living  '3 3 3     3  '$ SAB IAB Ectopic Multiple Live Births          3    # Outcome Date GA Lbr Len/2nd Weight Sex Delivery Anes PTL Lv  3 Term 1978   7 lb 1.6 oz (3.221 kg) M Vag-Spont   LIV  2 Term 1977   7 lb (3.175 kg) M Vag-Spont   LIV  1 Term 1975   7 lb 1.9 oz (3.23 kg) M Vag-Spont   LIV    Family History: Family History  Problem Relation Age of Onset   Diabetes Mother    Heart disease Mother    Diabetes Sister    Breast cancer Sister    Cancer Sister    Breast cancer Maternal Aunt 21   Lung cancer Maternal Aunt    Cancer Maternal Grandmother    Colon cancer Maternal Grandmother    Breast cancer Maternal Grandmother 80   Ovarian cancer Neg Hx     Social History: Social History   Socioeconomic History   Marital status: Divorced    Spouse name: Not on file   Number of children: Not on file   Years of education: Not on file   Highest education level: Not on file  Occupational History   Not on file  Tobacco Use   Smoking status: Never    Passive exposure: Yes   Smokeless tobacco: Never  Vaping Use   Vaping Use: Never used  Substance and Sexual Activity   Alcohol use: No   Drug use: No   Sexual activity: Not Currently  Other Topics Concern   Not on file  Social History Narrative   Lives alone at home.    Social Determinants of Health   Financial Resource Strain: Not on file  Food Insecurity: Not on file  Transportation Needs: Not on file  Physical Activity: Inactive (01/11/2018)   Exercise Vital Sign    Days of Exercise per Week: 0 days    Minutes of Exercise per Session: 0 min  Stress: Not on file  Social Connections: Not on file  Intimate Partner Violence: Not on file     Allergies: Allergies  Allergen Reactions   Azithromycin Itching   Penicillins Itching    Current Medications: Current Outpatient Medications  Medication Sig Dispense Refill   aspirin EC 81 MG tablet Take 81 mg by mouth daily.     bisoprolol-hydrochlorothiazide (ZIAC) 5-6.25 MG tablet Take 1 tablet by mouth once daily 90 tablet 1   calcium carbonate (OS-CAL) 600 MG TABS tablet Take 600 mg by mouth at bedtime.     celecoxib (CELEBREX) 200 MG capsule Take 1 capsule (200 mg total) by mouth 2 (two) times daily 180 capsule 1   cetirizine (ZYRTEC) 10 MG tablet Take 10 mg by mouth at bedtime.     docusate sodium (COLACE) 100 MG capsule Take 100 mg by mouth 2 (two) times daily.     fluticasone (FLONASE) 50 MCG/ACT nasal spray Place 2 sprays into both nostrils daily. 16 g 9   gabapentin (NEURONTIN) 300 MG capsule Take 1 capsule by mouth nightly 90 capsule 1   hydrochlorothiazide (HYDRODIURIL) 25 MG tablet TAKE 1  TABLET BY MOUTH ONCE DAILY 90 tablet 2   isosorbide mononitrate (IMDUR) 30 MG 24 hr tablet TAKE 1 TABLET BY MOUTH DAILY 90 tablet 3   levothyroxine (SYNTHROID) 150 MCG tablet Take 1 tablet (150 mcg total) by mouth once daily. Take on an empty stomach with a glass of water at least 30-60 minutes before breakfast. 90 tablet 1   Multiple Vitamins-Minerals (PRESERVISION AREDS 2+MULTI VIT PO) Take 1 capsule by mouth in the morning and at bedtime.     pantoprazole (PROTONIX) 40 MG tablet Take 40 mg by mouth 2 (two) times daily.     Potassium 99 MG TABS Take 99 mg by mouth in the morning and at bedtime.     traZODone (DESYREL) 50 MG tablet Take 50 mg by mouth at bedtime.     vitamin B-12 (CYANOCOBALAMIN) 1000 MCG tablet Take 1,000 mcg by mouth daily.     estradiol (ESTRACE) 0.1 MG/GM vaginal cream Place 4.01 Applicatorfuls vaginally 3 (three) times a week. 90 g 0   ondansetron (ZOFRAN) 8 MG tablet Take 8 mg by mouth every 8 (eight) hours as needed.     No current facility-administered  medications for this visit.    Review of Systems General: negative for, fevers, chills, fatigue, changes in sleep, changes in weight or appetite Skin: negative for changes in color, texture, moles or lesions Eyes: negative for, changes in vision, pain, diplopia HEENT: negative for, change in hearing, pain, discharge, tinnitus, vertigo, voice changes, sore throat, neck masses Breasts: negative for breast lumps Pulmonary: negative for, dyspnea, orthopnea, productive cough Cardiac: negative for, palpitations, syncope, pain, discomfort, pressure Gastrointestinal: negative for, dysphagia, nausea, vomiting, jaundice, pain, constipation, diarrhea, hematemesis, hematochezia Genitourinary/Sexual: negative for, dysuria, discharge, hesitancy, nocturia, retention, stones, infections, STD's, incontinence Musculoskeletal: negative for, pain, stiffness, swelling, range of motion limitation Hematology: No coagulation disorder, negative for, easy bruising, bleeding Neurologic/Psych: negative for, headaches, seizures, paralysis, weakness, tremor, change in gait, change in sensation, mood swings, depression, anxiety, change in memory  Objective:  Physical Examination:  BP 116/76   Pulse 63   Temp 98.7 F (37.1 C)   Resp 19   Wt 267 lb 1.6 oz (121.2 kg)   SpO2 95%   BMI 47.31 kg/m    ECOG Performance Status: 1 - Symptomatic but completely ambulatory  General appearance: alert, cooperative, and appears stated age HEENT:PERRLA and thyroid without masses Lymph node survey: non-palpable, axillary, inguinal, supraclavicular Cardiovascular: regular rate and rhythm Respiratory: normal air entry, lungs clear to auscultation Abdomen: soft, non-tender, without masses or organomegaly, no hernias, and well healed incision Back: inspection of back is normal Extremities: extremities normal, atraumatic, no cyanosis or edema Skin exam - normal coloration and turgor, no rashes, no suspicious skin lesions  noted. Neurological exam reveals alert, oriented, normal speech, no focal findings or movement disorder noted.  Pelvic: exam chaperoned by nurse;  Vulva: atrophic and mucosa intact.  No lesions seen.  Vagina: normal; Adnexa: normal adnexa in size, nontender and no masses; Uterus: uterus is normal size, shape, consistency and nontender; Cervix: anteverted; Rectal: not indicated  Lab Review Labs on site today:   Chemistry      Component Value Date/Time   NA 138 02/14/2022 1038   NA 138 02/16/2012 0837   K 3.5 02/14/2022 1038   K 3.4 (L) 02/16/2012 0837   CL 101 02/14/2022 1038   CL 100 02/16/2012 0837   CO2 31 02/14/2022 1038   CO2 30 02/16/2012 0837   BUN 22 02/14/2022 1038  BUN 12 02/16/2012 0837   CREATININE 1.09 (H) 02/14/2022 1038   CREATININE 1.01 02/16/2012 0837      Component Value Date/Time   CALCIUM 9.2 02/14/2022 1038   CALCIUM 9.0 02/16/2012 0837   ALKPHOS 58 02/14/2022 1038   AST 20 02/14/2022 1038   ALT 20 02/14/2022 1038   BILITOT 0.6 02/14/2022 1038       Lab Results  Component Value Date   WBC 8.4 02/14/2022   HGB 11.7 (L) 02/14/2022   HCT 36.8 02/14/2022   MCV 81.6 02/14/2022   PLT 172 02/14/2022     Assessment:  Rebekah Perkins is a 66 y.o. female diagnosed with large area of VIN3 and squamous cell cancer of the vulva 4/23.  PET/CT negative for metastatic disease.  Completed vulvar radiation 7/23.  No evidence of disease on exam today.    Medical co-morbidities complicating care: Morbid obesity, Sleep apnea, HTN.  Plan:   Problem List Items Addressed This Visit       Genitourinary   Vulvar cancer, carcinoma ([place]) - Primary   Relevant Medications   ondansetron (ZOFRAN) 8 MG tablet    She will follow up with Dr Christel Mormon and have PET/CT to assess response.  She can RTC to see [place] in 6 months or sooner if any concerning symptoms arise.   The patient's diagnosis, an outline of the further diagnostic and laboratory studies which will be required,  the recommendation, and alternatives were discussed.  All questions were answered to the patient's satisfaction.  Mellody Drown, MD  CC:  Doy Hutching Leonie Douglas, MD [facility] [place],   [address] [phone]

## 2022-09-06 ENCOUNTER — Other Ambulatory Visit: Payer: Self-pay | Admitting: Internal Medicine

## 2022-09-06 DIAGNOSIS — Z1231 Encounter for screening mammogram for malignant neoplasm of breast: Secondary | ICD-10-CM

## 2022-10-21 ENCOUNTER — Ambulatory Visit
Admission: RE | Admit: 2022-10-21 | Discharge: 2022-10-21 | Disposition: A | Discharge status: Home / Self Care | Payer: Medicare HMO | Source: Ambulatory Visit | Attending: Internal Medicine | Admitting: Internal Medicine

## 2022-10-21 DIAGNOSIS — Z1231 Encounter for screening mammogram for malignant neoplasm of breast: Secondary | ICD-10-CM | POA: Diagnosis present

## 2022-12-07 DIAGNOSIS — R7303 Prediabetes: Secondary | ICD-10-CM | POA: Insufficient documentation

## 2023-01-11 ENCOUNTER — Inpatient Hospital Stay: Payer: Medicare HMO | Attending: Obstetrics and Gynecology | Admitting: Obstetrics and Gynecology

## 2023-01-11 VITALS — BP 108/71 | HR 71 | Temp 97.6°F | Resp 19 | Wt 260.2 lb

## 2023-01-11 DIAGNOSIS — Z8544 Personal history of malignant neoplasm of other female genital organs: Secondary | ICD-10-CM | POA: Insufficient documentation

## 2023-01-11 DIAGNOSIS — Z923 Personal history of irradiation: Secondary | ICD-10-CM | POA: Diagnosis not present

## 2023-01-11 DIAGNOSIS — C519 Malignant neoplasm of vulva, unspecified: Secondary | ICD-10-CM

## 2023-01-11 NOTE — Progress Notes (Signed)
Gynecologic Oncology Consult Visit   Referring Provider: Dr Amalia Hailey  Chief Concern: vulvar cancer, s/p radiation  Subjective:  Rebekah Perkins is a 67 y.o. female who is seen in consultation from Dr. Amalia Hailey for [address] and invasive vulvar cancer.   Still tired, but vulva no longer sore. No discharge, bleeding or new lesions she is aware of.   Gyn Oncology history She presented to Dr Amalia Hailey for her annual examination 02/04/22.  She states that she was doing well since last year but in February she had 3 to 4 days of vaginal bleeding "almost like a period".  Then again early in March she had 1 day of bleeding.  She says she feels like "something is tearing down there". She continues to use estrogen vaginal cream every other day.  Vulvar exam shows posterior fourchette with large apparently kissing lesion.  Very friable.  Erythematous.   Biopsy of vulva: At least intraepithelial squamous cell carcinoma (Carcinoma in-situ / VIN III). Invasive carcinoma cannot be excluded in the submitted biopsy due to superficial nature of the biopsy.   Seen by Gyn Oncology  02/16/22 VULVA, LEFT; BIOPSY: INVASIVE SQUAMOUS CELL CARCINOMA, P16 POSITIVE.  Sections demonstrate an invasive poorly differentiated malignant neoplasm that focally undermines intact squamous mucosa. A limited panel of immunohistochemical stains was performed. The malignant neoplasm is positive for pancytokeratin, p63, and p16 (diffuse, block-like). This pattern of immunoreactivity supports the above diagnosis.   PET/CT 03/18/22 1. Focal uptake at the left posterior peritoneum/multiple compatible with known primary malignancy.  2. No evidence of metastatic disease.  3. Mildly enlarged thyroid gland with diffusely increased uptake suggestive of Graves' disease or thyroiditis. Correlate clinically.   In view of diffuse involvement of vulva with VIN and invasive cancer decision made to treat with radiation.   Treated by Dr Christel Mormon at  [facility] --------------------------------------------------------------------------------------------------  Vulva, IMRT 10X 5040cGy 40 180cGy 03/30/22-05/16/22  --------------------------------------------------------------------------------------------------  Vulva boost, IMRT 10X 1600cGy 10 200cGy 05/18/22-05/27/22 --------------------------------------------------------------------------------------------------  Total dose 6640cGy 59   Problem List: Patient Active Problem List   Diagnosis Date Noted   Vulvar cancer, carcinoma ([place]) 02/15/2022   Screening for colon cancer    Chronic GERD    BMI 45.0-49.9, adult ([place]) 12/03/2019   Endometrial hyperplasia without atypia, simple 07/21/2015   Diverticulosis 07/21/2015   Neuritis or radiculitis due to rupture of lumbar intervertebral disc 11/12/2014   Lumbar canal stenosis 11/12/2014   Bursitis, trochanteric 11/12/2014   Back ache 04/10/2014   Chronic anemia 04/10/2014   BP (high blood pressure) 04/10/2014   Blood glucose elevated 04/10/2014   HLD (hyperlipidemia) 04/10/2014   Adult hypothyroidism 04/10/2014   Adiposity 04/10/2014   Obstructive apnea 04/10/2014    Past Medical History: Past Medical History:  Diagnosis Date   Anxiety    Apnea    Complication of anesthesia    slow to wake up afterwards   Diverticulitis    Environmental allergies    Hypertension    Menopausal state    Obesity    PMB (postmenopausal bleeding)    PVD (peripheral vascular disease) (HCC)    Rosacea    Simple endometrial hyperplasia    Sleep apnea    Thyroid disease    Urinary urgency     Past Surgical History: Past Surgical History:  Procedure Laterality Date   CHOLECYSTECTOMY     COLONOSCOPY WITH PROPOFOL N/A 05/05/2021   Procedure: COLONOSCOPY WITH PROPOFOL;  Surgeon: Lin Landsman, MD;  Location: [facility] ENDOSCOPY;  Service: Gastroenterology;  Laterality: N/A;  DILATION AND CURETTAGE OF UTERUS     ESOPHAGOGASTRODUODENOSCOPY (EGD) WITH  PROPOFOL N/A 02/03/2021   Procedure: ESOPHAGOGASTRODUODENOSCOPY (EGD) WITH PROPOFOL;  Surgeon: Lin Landsman, MD;  Location: [facility] ENDOSCOPY;  Service: Gastroenterology;  Laterality: N/A;   HYSTEROSCOPY      OB History:  OB History  Gravida Para Term Preterm AB Living  '3 3 3     3  '$ SAB IAB Ectopic Multiple Live Births          3    # Outcome Date GA Lbr Len/2nd Weight Sex Delivery Anes PTL Lv  3 Term 1978   7 lb 1.6 oz (3.221 kg) M Vag-Spont   LIV  2 Term 1977   7 lb (3.175 kg) M Vag-Spont   LIV  1 Term 1975   7 lb 1.9 oz (3.23 kg) M Vag-Spont   LIV    Family History: Family History  Problem Relation Age of Onset   Diabetes Mother    Heart disease Mother    Diabetes Sister    Breast cancer Sister    Cancer Sister    Breast cancer Maternal Aunt 52   Lung cancer Maternal Aunt    Cancer Maternal Grandmother    Colon cancer Maternal Grandmother    Breast cancer Maternal Grandmother 80   Ovarian cancer Neg Hx     Social History: Social History   Socioeconomic History   Marital status: Divorced    Spouse name: Not on file   Number of children: Not on file   Years of education: Not on file   Highest education level: Not on file  Occupational History   Not on file  Tobacco Use   Smoking status: Never    Passive exposure: Yes   Smokeless tobacco: Never  Vaping Use   Vaping Use: Never used  Substance and Sexual Activity   Alcohol use: No   Drug use: No   Sexual activity: Not Currently  Other Topics Concern   Not on file  Social History Narrative   Lives alone at home.    Social Determinants of Health   Financial Resource Strain: Not on file  Food Insecurity: Not on file  Transportation Needs: Not on file  Physical Activity: Inactive (01/11/2018)   Exercise Vital Sign    Days of Exercise per Week: 0 days    Minutes of Exercise per Session: 0 min  Stress: Not on file  Social Connections: Not on file  Intimate Partner Violence: Not on file     Allergies: Allergies  Allergen Reactions   Azithromycin Itching   Penicillins Itching    Current Medications: Current Outpatient Medications  Medication Sig Dispense Refill   aspirin EC 81 MG tablet Take 81 mg by mouth daily.     bisoprolol-hydrochlorothiazide (ZIAC) 5-6.25 MG tablet Take 1 tablet by mouth once daily 90 tablet 1   calcium carbonate (OS-CAL) 600 MG TABS tablet Take 600 mg by mouth at bedtime.     celecoxib (CELEBREX) 200 MG capsule Take 1 capsule (200 mg total) by mouth 2 (two) times daily 180 capsule 1   cetirizine (ZYRTEC) 10 MG tablet Take 10 mg by mouth at bedtime.     docusate sodium (COLACE) 100 MG capsule Take 100 mg by mouth 2 (two) times daily.     fluticasone (FLONASE) 50 MCG/ACT nasal spray Place 2 sprays into both nostrils daily. 16 g 9   gabapentin (NEURONTIN) 300 MG capsule Take 1 capsule by mouth nightly 90 capsule 1  hydrochlorothiazide (HYDRODIURIL) 25 MG tablet TAKE 1 TABLET BY MOUTH ONCE DAILY 90 tablet 2   isosorbide mononitrate (IMDUR) 30 MG 24 hr tablet TAKE 1 TABLET BY MOUTH DAILY 90 tablet 3   levothyroxine (SYNTHROID) 150 MCG tablet Take 1 tablet (150 mcg total) by mouth once daily. Take on an empty stomach with a glass of water at least 30-60 minutes before breakfast. 90 tablet 1   Multiple Vitamins-Minerals (PRESERVISION AREDS 2+MULTI VIT PO) Take 1 capsule by mouth in the morning and at bedtime.     pantoprazole (PROTONIX) 40 MG tablet Take 40 mg by mouth 2 (two) times daily.     Potassium 99 MG TABS Take 99 mg by mouth in the morning and at bedtime.     vitamin B-12 (CYANOCOBALAMIN) 1000 MCG tablet Take 1,000 mcg by mouth daily.     ondansetron (ZOFRAN) 8 MG tablet Take 8 mg by mouth every 8 (eight) hours as needed. (Patient not taking: Reported on 01/11/2023)     traZODone (DESYREL) 50 MG tablet Take 50 mg by mouth at bedtime. (Patient not taking: Reported on 01/11/2023)     No current facility-administered medications for this visit.     Review of Systems General: negative for, fevers, chills, fatigue, changes in sleep, changes in weight or appetite Skin: negative for changes in color, texture, moles or lesions Eyes: negative for, changes in vision, pain, diplopia HEENT: negative for, change in hearing, pain, discharge, tinnitus, vertigo, voice changes, sore throat, neck masses Breasts: negative for breast lumps Pulmonary: negative for, dyspnea, orthopnea, productive cough Cardiac: negative for, palpitations, syncope, pain, discomfort, pressure Gastrointestinal: negative for, dysphagia, nausea, vomiting, jaundice, pain, constipation, diarrhea, hematemesis, hematochezia Genitourinary/Sexual: negative for, dysuria, discharge, hesitancy, nocturia, retention, stones, infections, STD's, incontinence Musculoskeletal: negative for, pain, stiffness, swelling, range of motion limitation Hematology: No coagulation disorder, negative for, easy bruising, bleeding Neurologic/Psych: negative for, headaches, seizures, paralysis, weakness, tremor, change in gait, change in sensation, mood swings, depression, anxiety, change in memory  Objective:  Physical Examination:  BP 108/71   Pulse 71   Temp 97.6 F (36.4 C)   Resp 19   Wt 260 lb 3.2 oz (118 kg)   SpO2 99%   BMI 46.09 kg/m    ECOG Performance Status: 1 - Symptomatic but completely ambulatory  General appearance: alert, cooperative, and appears stated age HEENT:PERRLA and thyroid without masses Lymph node survey: non-palpable, axillary, inguinal, supraclavicular Cardiovascular: regular rate and rhythm Respiratory: normal air entry, lungs clear to auscultation Abdomen: soft, non-tender, without masses or organomegaly, no hernias, and well healed incision Back: inspection of back is normal Extremities: extremities normal, atraumatic, no cyanosis or edema Skin exam - normal coloration and turgor, no rashes, no suspicious skin lesions noted. Neurological exam reveals  alert, oriented, normal speech, no focal findings or movement disorder noted.  Pelvic: exam chaperoned by nurse;  Vulva: atrophic and mucosa intact.  No lesions seen.  Vagina: normal; Adnexa: normal adnexa in size, nontender and no masses; Uterus: uterus is normal size, shape, consistency and nontender; Cervix: anteverted; Rectal: not indicated  Lab Review Labs on site today:   Chemistry      Component Value Date/Time   NA 138 02/14/2022 1038   NA 138 02/16/2012 0837   K 3.5 02/14/2022 1038   K 3.4 (L) 02/16/2012 0837   CL 101 02/14/2022 1038   CL 100 02/16/2012 0837   CO2 31 02/14/2022 1038   CO2 30 02/16/2012 0837   BUN 22 02/14/2022 1038  BUN 12 02/16/2012 0837   CREATININE 1.09 (H) 02/14/2022 1038   CREATININE 1.01 02/16/2012 0837      Component Value Date/Time   CALCIUM 9.2 02/14/2022 1038   CALCIUM 9.0 02/16/2012 0837   ALKPHOS 58 02/14/2022 1038   AST 20 02/14/2022 1038   ALT 20 02/14/2022 1038   BILITOT 0.6 02/14/2022 1038       Lab Results  Component Value Date   WBC 8.4 02/14/2022   HGB 11.7 (L) 02/14/2022   HCT 36.8 02/14/2022   MCV 81.6 02/14/2022   PLT 172 02/14/2022     Assessment:  Rebekah Perkins is a 67 y.o. female diagnosed with large area of VIN3 and squamous cell cancer of the vulva 4/23.  PET/CT negative for metastatic disease.  Completed vulvar radiation 7/23.  No evidence of disease on exam today.    Medical co-morbidities complicating care: Morbid obesity, Sleep apnea, HTN.  Plan:   Problem List Items Addressed This Visit       Genitourinary   Vulvar cancer, carcinoma ([place]) - Primary     She will follow up with Dr Christel Mormon in 3 months with PET/CT.  She can RTC to see [place] in 6 months or sooner if any concerning symptoms arise.   The patient's diagnosis, an outline of the further diagnostic and laboratory studies which will be required, the recommendation, and alternatives were discussed.  All questions were answered to the patient's  satisfaction.  Mellody Drown, MD  CC:  Doy Hutching Leonie Douglas, MD [facility] [place],  [place] [address] [phone]

## 2023-02-07 ENCOUNTER — Ambulatory Visit (INDEPENDENT_AMBULATORY_CARE_PROVIDER_SITE_OTHER): Payer: Medicare HMO | Admitting: Obstetrics and Gynecology

## 2023-02-07 ENCOUNTER — Encounter: Payer: Self-pay | Admitting: Obstetrics and Gynecology

## 2023-02-07 ENCOUNTER — Other Ambulatory Visit (HOSPITAL_COMMUNITY)
Admission: RE | Admit: 2023-02-07 | Discharge: 2023-02-07 | Disposition: A | Discharge status: Home / Self Care | Payer: Medicare HMO | Source: Ambulatory Visit | Attending: Obstetrics and Gynecology | Admitting: Obstetrics and Gynecology

## 2023-02-07 VITALS — BP 126/81 | HR 65 | Ht 63.0 in | Wt 259.6 lb

## 2023-02-07 DIAGNOSIS — Z124 Encounter for screening for malignant neoplasm of cervix: Secondary | ICD-10-CM | POA: Insufficient documentation

## 2023-02-07 DIAGNOSIS — Z01419 Encounter for gynecological examination (general) (routine) without abnormal findings: Secondary | ICD-10-CM | POA: Insufficient documentation

## 2023-02-07 DIAGNOSIS — C519 Malignant neoplasm of vulva, unspecified: Secondary | ICD-10-CM

## 2023-02-07 NOTE — Progress Notes (Signed)
HPI:      [name] is a 67 y.o. [phone] who LMP was No LMP recorded. Patient is postmenopausal.  Subjective:   She presents today for her annual examination.  She has no complaints or issues today.  She has been seeing Dr. Fransisca Connors for VIN 3 of the vulva.  She would like to get a Pap smear today.    Hx: The following portions of the patient's history were reviewed and updated as appropriate:             She  has a past medical history of Anxiety, Apnea, Complication of anesthesia, Diverticulitis, Environmental allergies, Hypertension, Menopausal state, Obesity, PMB (postmenopausal bleeding), PVD (peripheral vascular disease) ([place]), Rosacea, Simple endometrial hyperplasia, Sleep apnea, Thyroid disease, and Urinary urgency. She does not have any pertinent problems on file. She  has a past surgical history that includes Dilation and curettage of uterus; Hysteroscopy; Cholecystectomy; Esophagogastroduodenoscopy (egd) with propofol (N/A, 02/03/2021); and Colonoscopy with propofol (N/A, 05/05/2021). Her family history includes Breast cancer in her sister; Breast cancer (age of onset: 12) in her maternal aunt; Breast cancer (age of onset: 81) in her maternal grandmother; Cancer in her maternal grandmother and sister; Colon cancer in her maternal grandmother; Diabetes in her mother and sister; Heart disease in her mother; Lung cancer in her maternal aunt. She  reports that she has never smoked. She has been exposed to tobacco smoke. She has never used smokeless tobacco. She reports that she does not drink alcohol and does not use drugs. She has a current medication list which includes the following prescription(s): aspirin ec, bisoprolol-hydrochlorothiazide, calcium carbonate, celecoxib, cetirizine, docusate sodium, fluticasone, gabapentin, levothyroxine, multiple vitamins-minerals, ondansetron, pantoprazole, potassium, cyanocobalamin, hydrochlorothiazide, isosorbide mononitrate, and [DISCONTINUED]  phentermine. She is allergic to azithromycin and penicillins.       Review of Systems:  Review of Systems  Constitutional: Denied constitutional symptoms, night sweats, recent illness, fatigue, fever, insomnia and weight loss.  Eyes: Denied eye symptoms, eye pain, photophobia, vision change and visual disturbance.  Ears/Nose/Throat/Neck: Denied ear, nose, throat or neck symptoms, hearing loss, nasal discharge, sinus congestion and sore throat.  Cardiovascular: Denied cardiovascular symptoms, arrhythmia, chest pain/pressure, edema, exercise intolerance, orthopnea and palpitations.  Respiratory: Denied pulmonary symptoms, asthma, pleuritic pain, productive sputum, cough, dyspnea and wheezing.  Gastrointestinal: Denied, gastro-esophageal reflux, melena, nausea and vomiting.  Genitourinary: See HPI for additional information.  Musculoskeletal: Denied musculoskeletal symptoms, stiffness, swelling, muscle weakness and myalgia.  Dermatologic: Denied dermatology symptoms, rash and scar.  Neurologic: Denied neurology symptoms, dizziness, headache, neck pain and syncope.  Psychiatric: Denied psychiatric symptoms, anxiety and depression.  Endocrine: Denied endocrine symptoms including hot flashes and night sweats.   Meds:   Current Outpatient Medications on File Prior to Visit  Medication Sig Dispense Refill   aspirin EC 81 MG tablet Take 81 mg by mouth daily.     bisoprolol-hydrochlorothiazide (ZIAC) 5-6.25 MG tablet Take 1 tablet by mouth once daily 90 tablet 1   calcium carbonate (OS-CAL) 600 MG TABS tablet Take 600 mg by mouth at bedtime.     celecoxib (CELEBREX) 200 MG capsule Take 1 capsule (200 mg total) by mouth 2 (two) times daily 180 capsule 1   cetirizine (ZYRTEC) 10 MG tablet Take 10 mg by mouth at bedtime.     docusate sodium (COLACE) 100 MG capsule Take 100 mg by mouth 2 (two) times daily.     fluticasone (FLONASE) 50 MCG/ACT nasal spray Place 2 sprays into both nostrils daily. [place]  g 9    gabapentin (NEURONTIN) 300 MG capsule Take 1 capsule by mouth nightly 90 capsule 1   levothyroxine (SYNTHROID) 150 MCG tablet Take 1 tablet (150 mcg total) by mouth once daily. Take on an empty stomach with a glass of water at least 30-60 minutes before breakfast. 90 tablet 1   Multiple Vitamins-Minerals (PRESERVISION AREDS 2+MULTI VIT PO) Take 1 capsule by mouth in the morning and at bedtime.     ondansetron (ZOFRAN) 8 MG tablet Take 8 mg by mouth every 8 (eight) hours as needed.     pantoprazole (PROTONIX) 40 MG tablet Take 40 mg by mouth 2 (two) times daily.     Potassium 99 MG TABS Take 99 mg by mouth in the morning and at bedtime.     vitamin B-12 (CYANOCOBALAMIN) 1000 MCG tablet Take 1,000 mcg by mouth daily.     hydrochlorothiazide (HYDRODIURIL) 25 MG tablet TAKE 1 TABLET BY MOUTH ONCE DAILY 90 tablet 2   isosorbide mononitrate (IMDUR) 30 MG 24 hr tablet TAKE 1 TABLET BY MOUTH DAILY 90 tablet 3   [DISCONTINUED] phentermine (ADIPEX-P) 37.5 MG tablet Take 1 tablet (37.5 mg total) by mouth once daily 90 tablet 1   No current facility-administered medications on file prior to visit.     Objective:     Vitals:   02/07/23 0814  BP: 126/81  Pulse: 65    Filed Weights   02/07/23 0814  Weight: 259 lb 9.6 oz (117.8 kg)              Physical examination General NAD, Conversant  HEENT Atraumatic; Op clear with mmm.  Normo-cephalic. Pupils reactive. Anicteric sclerae  Thyroid/Neck Smooth without nodularity or enlargement. Normal ROM.  Neck Supple.  Skin No rashes, lesions or ulceration. Normal palpated skin turgor. No nodularity.  Breasts: Declined  Lungs: Clear to auscultation.No rales or wheezes. Normal Respiratory effort, no retractions.  Heart: NSR.  No murmurs or rubs appreciated. No peripheral edema  Abdomen: Soft.  Non-tender.  No masses.  No HSM. No hernia  Extremities: Moves all appropriately.  Normal ROM for age. No lymphadenopathy.  Neuro: Oriented to PPT.  Normal mood.  Normal affect.     Pelvic:   Vulva: Atrophic appearance  Vagina: No lesions or abnormalities noted.  Support: Normal pelvic support.  Urethra No masses tenderness or scarring.  Meatus Normal size without lesions or prolapse.  Cervix: Normal appearance.  No lesions.  Anus: Normal exam.  No lesions.  Perineum: Normal exam.  No lesions.        Bimanual   Uterus: Normal size.  Non-tender.  Mobile.  AV.  Adnexae: No masses.  Non-tender to palpation.  Cul-de-sac: Negative for abnormality.     Assessment:    [id number] Patient Active Problem List   Diagnosis Date Noted   Vulvar cancer, carcinoma ([place]) 02/15/2022   Screening for colon cancer    Chronic GERD    BMI 45.0-49.9, adult ([place]) 12/03/2019   Endometrial hyperplasia without atypia, simple 07/21/2015   Diverticulosis 07/21/2015   Neuritis or radiculitis due to rupture of lumbar intervertebral disc 11/12/2014   Lumbar canal stenosis 11/12/2014   Bursitis, trochanteric 11/12/2014   Back ache 04/10/2014   Chronic anemia 04/10/2014   BP (high blood pressure) 04/10/2014   Blood glucose elevated 04/10/2014   HLD (hyperlipidemia) 04/10/2014   Adult hypothyroidism 04/10/2014   Adiposity 04/10/2014   Obstructive apnea 04/10/2014     1. Well woman exam with routine gynecological exam  2. Vulvar cancer, carcinoma ([place])   3. Cervical cancer screening     Generally doing well. -Will do a Pap today because of the possibility of HPV from her vulva to her cervix.   Plan:            1.  Basic Screening Recommendations The basic screening recommendations for asymptomatic women were discussed with the patient during her visit.  The age-appropriate recommendations were discussed with her and the rational for the tests reviewed.  When I am informed by the patient that another primary care physician has previously obtained the age-appropriate tests and they are up-to-date, only outstanding tests are ordered and referrals given as  necessary.  Abnormal results of tests will be discussed with her when all of her results are completed.  Routine preventative health maintenance measures emphasized: Exercise/Diet/Weight control, Tobacco Warnings, Alcohol/Substance use risks and Stress Management Pap performed -if normal would suggest this as her last. Mammogram as scheduled Orders No orders of the defined types were placed in this encounter.   No orders of the defined types were placed in this encounter.       F/U  Return in about 1 year (around 02/07/2024) for Annual Physical.  Finis Bud, M.D. 02/07/2023 8:43 AM

## 2023-02-07 NOTE — Progress Notes (Signed)
Patients presents for annual exam today. She states she is continuing to see Dr. Fransisca Connors for VIN 3, has a follow-up in a few months. Patient is postmenopausal. Pap smear ordered. Patient is up to date with mammogram. Annual labs are declined. She states no other questions or concerns at this time.

## 2023-02-13 LAB — CYTOLOGY - PAP
Comment: NEGATIVE
Diagnosis: UNDETERMINED — AB
High risk HPV: NEGATIVE

## 2023-04-14 ENCOUNTER — Other Ambulatory Visit: Payer: Self-pay | Admitting: Family Medicine

## 2023-04-14 DIAGNOSIS — M48061 Spinal stenosis, lumbar region without neurogenic claudication: Secondary | ICD-10-CM

## 2023-04-28 ENCOUNTER — Ambulatory Visit
Admission: RE | Admit: 2023-04-28 | Discharge: 2023-04-28 | Disposition: A | Discharge status: Home / Self Care | Payer: Medicare HMO | Source: Ambulatory Visit | Attending: Family Medicine | Admitting: Family Medicine

## 2023-04-28 DIAGNOSIS — M48061 Spinal stenosis, lumbar region without neurogenic claudication: Secondary | ICD-10-CM

## 2023-07-12 ENCOUNTER — Encounter: Payer: Self-pay | Admitting: Obstetrics and Gynecology

## 2023-07-12 ENCOUNTER — Inpatient Hospital Stay: Payer: Medicare HMO | Attending: Obstetrics and Gynecology | Admitting: Obstetrics and Gynecology

## 2023-07-12 VITALS — BP 124/71 | HR 69 | Resp 18 | Wt 275.6 lb

## 2023-07-12 DIAGNOSIS — Z923 Personal history of irradiation: Secondary | ICD-10-CM | POA: Diagnosis not present

## 2023-07-12 DIAGNOSIS — Z8544 Personal history of malignant neoplasm of other female genital organs: Secondary | ICD-10-CM

## 2023-07-12 DIAGNOSIS — R229 Localized swelling, mass and lump, unspecified: Secondary | ICD-10-CM | POA: Diagnosis not present

## 2023-07-12 DIAGNOSIS — Z9221 Personal history of antineoplastic chemotherapy: Secondary | ICD-10-CM | POA: Insufficient documentation

## 2023-07-12 DIAGNOSIS — Z08 Encounter for follow-up examination after completed treatment for malignant neoplasm: Secondary | ICD-10-CM | POA: Diagnosis not present

## 2023-07-12 NOTE — Progress Notes (Signed)
Gynecologic Oncology Interval Visit   Referring Provider: Dr Logan Bores  Chief Concern: vulvar cancer, s/p radiation  Subjective:  Rebekah Perkins is a 67 y.o. female who is seen in consultation from Dr. Logan Bores for VIN3 and invasive vulvar cancer, medically inoperable (cT1bN0), s/p radiation and boost with concurrent cisplatin, completed 05/27/22, who returns to clinic for surveillance.   In interim, she saw Dr Logan Bores on 02/07/23 with negative exam. Pap was obtained.   02/07/23- Pap- ASCUS, HR HPV Negative.   She has not seen rad-onc for surveillance. In interim, she has been followed by physical medicine for lumbar radiculitis. She denies vaginal complaints. She felt a small lump in her abdomen this morning and is concerned about it.   Gyn Oncology History She presented to Dr Logan Bores for her annual examination 02/04/22.  She states that she was doing well since last year but in February she had 3 to 4 days of vaginal bleeding "almost like a period".  Then again early in March she had 1 day of bleeding.  She says she feels like "something is tearing down there". She continues to use estrogen vaginal cream every other day.  Vulvar exam shows posterior fourchette with large apparently kissing lesion.  Very friable.  Erythematous.   Biopsy of vulva: At least intraepithelial squamous cell carcinoma (Carcinoma in-situ / VIN III). Invasive carcinoma cannot be excluded in the submitted biopsy due to superficial nature of the biopsy.   Seen by Gyn Oncology  02/16/22 VULVA, LEFT; BIOPSY: INVASIVE SQUAMOUS CELL CARCINOMA, P16 POSITIVE.  Sections demonstrate an invasive poorly differentiated malignant neoplasm that focally undermines intact squamous mucosa. A limited panel of immunohistochemical stains was performed. The malignant neoplasm is positive for pancytokeratin, p63, and p16 (diffuse, block-like). This pattern of immunoreactivity supports the above diagnosis.   PET/CT 03/18/22 1. Focal uptake at the left  posterior peritoneum/multiple compatible with known primary malignancy.  2. No evidence of metastatic disease.  3. Mildly enlarged thyroid gland with diffusely increased uptake suggestive of Graves' disease or thyroiditis. Correlate clinically.   In view of diffuse involvement of vulva with VIN and invasive cancer decision made to treat with radiation.   Treated by Dr Marinell Blight at [facility], IMRT 10X 5040cGy 48 180cGy 03/30/22-05/16/22  Vulva boost, IMRT 10X 1600cGy 10 200cGy 05/18/22-05/27/22 Total dose 6640cGy 59   08/31/22- PET  IMPRESSION:  1. Resolved hypermetabolic activity in the left vulva.  2.  No evidence of FDG avid metastatic disease.  3.  Findings suggestive of thyroiditis     Problem List: Patient Active Problem List   Diagnosis Date Noted   Vulvar cancer, carcinoma ([facility]) 02/15/2022   Screening for colon cancer    Chronic GERD    BMI 45.0-49.9, adult ([facility]) 12/03/2019   Endometrial hyperplasia without atypia, simple 07/21/2015   Diverticulosis 07/21/2015   Neuritis or radiculitis due to rupture of lumbar intervertebral disc 11/12/2014   Lumbar canal stenosis 11/12/2014   Bursitis, trochanteric 11/12/2014   Back ache 04/10/2014   Chronic anemia 04/10/2014   BP (high blood pressure) 04/10/2014   Blood glucose elevated 04/10/2014   HLD (hyperlipidemia) 04/10/2014   Adult hypothyroidism 04/10/2014   Adiposity 04/10/2014   Obstructive apnea 04/10/2014    Past Medical History: Past Medical History:  Diagnosis Date   Anxiety    Apnea    Complication of anesthesia    slow to wake up afterwards   Diverticulitis    Environmental allergies    Hypertension    Menopausal state  Obesity    PMB (postmenopausal bleeding)    PVD (peripheral vascular disease) ([facility])    Rosacea    Simple endometrial hyperplasia    Sleep apnea    Thyroid disease    Urinary urgency     Past Surgical History: Past Surgical History:  Procedure Laterality Date   CHOLECYSTECTOMY      COLONOSCOPY WITH PROPOFOL N/A 05/05/2021   Procedure: COLONOSCOPY WITH PROPOFOL;  Surgeon: Toney Reil, MD;  Location[facility] ENDOSCOPY;  Service: Gastroenterology;  Laterality: N/A;   DILATION AND CURETTAGE OF UTERUS     ESOPHAGOGASTRODUODENOSCOPY (EGD) WITH PROPOFOL N/A 02/03/2021   Procedure: ESOPHAGOGASTRODUODENOSCOPY (EGD) WITH PROPOFOL;  Surgeon: Toney Reil, MD;  Location: [facility] ENDOSCOPY;  Service: Gastroenterology;  Laterality: N/A;   HYSTEROSCOPY      OB History:  OB History  Gravida Para Term Preterm AB Living  3 3 3     3   SAB IAB Ectopic Multiple Live Births          3    # Outcome Date GA Lbr Len/2nd Weight Sex Type Anes PTL Lv  3 Term 1978   7 lb 1.6 oz (3.221 kg) M Vag-Spont   LIV  2 Term 1977   7 lb (3.175 kg) M Vag-Spont   LIV  1 Term 1975   7 lb 1.9 oz (3.23 kg) M Vag-Spont   LIV    Family History: Family History  Problem Relation Age of Onset   Diabetes Mother    Heart disease Mother    Diabetes Sister    Breast cancer Sister    Cancer Sister    Breast cancer Maternal Aunt 60   Lung cancer Maternal Aunt    Cancer Maternal Grandmother    Colon cancer Maternal Grandmother    Breast cancer Maternal Grandmother 42   Ovarian cancer Neg Hx     Social History: Social History   Socioeconomic History   Marital status: Divorced    Spouse name: Not on file   Number of children: Not on file   Years of education: Not on file   Highest education level: Not on file  Occupational History   Not on file  Tobacco Use   Smoking status: Never    Passive exposure: Yes   Smokeless tobacco: Never  Vaping Use   Vaping status: Never Used  Substance and Sexual Activity   Alcohol use: No   Drug use: No   Sexual activity: Not Currently  Other Topics Concern   Not on file  Social History Narrative   Lives alone at home.    Social Determinants of Health   Financial Resource Strain: Not on file  Food Insecurity: Not on file  Transportation Needs: Not  on file  Physical Activity: Inactive (01/11/2018)   Exercise Vital Sign    Days of Exercise per Week: 0 days    Minutes of Exercise per Session: 0 min  Stress: Not on file  Social Connections: Not on file  Intimate Partner Violence: Not on file    Allergies: Allergies  Allergen Reactions   Azithromycin Itching   Penicillins Itching    Current Medications: Current Outpatient Medications  Medication Sig Dispense Refill   aspirin EC 81 MG tablet Take 81 mg by mouth daily.     bisoprolol-hydrochlorothiazide (ZIAC) 5-6.25 MG tablet Take 1 tablet by mouth once daily 90 tablet 1   calcium carbonate (OS-CAL) 600 MG TABS tablet Take 600 mg by mouth at bedtime.  celecoxib (CELEBREX) 200 MG capsule Take 1 capsule (200 mg total) by mouth 2 (two) times daily 180 capsule 1   cetirizine (ZYRTEC) 10 MG tablet Take 10 mg by mouth at bedtime.     docusate sodium (COLACE) 100 MG capsule Take 100 mg by mouth 2 (two) times daily.     fluticasone (FLONASE) 50 MCG/ACT nasal spray Place 2 sprays into both nostrils daily. 16 g 9   gabapentin (NEURONTIN) 300 MG capsule Take 1 capsule by mouth nightly 90 capsule 1   hydrochlorothiazide (HYDRODIURIL) 25 MG tablet TAKE 1 TABLET BY MOUTH ONCE DAILY 90 tablet 2   isosorbide mononitrate (IMDUR) 30 MG 24 hr tablet TAKE 1 TABLET BY MOUTH DAILY 90 tablet 3   levothyroxine (SYNTHROID) 150 MCG tablet Take 1 tablet (150 mcg total) by mouth once daily. Take on an empty stomach with a glass of water at least 30-60 minutes before breakfast. 90 tablet 1   magnesium oxide (MAG-OX) 400 MG tablet Take 1 tablet by mouth 2 (two) times daily.     Multiple Vitamins-Minerals (PRESERVISION AREDS 2+MULTI VIT PO) Take 1 capsule by mouth in the morning and at bedtime.     pantoprazole (PROTONIX) 40 MG tablet Take 40 mg by mouth 2 (two) times daily.     Potassium 99 MG TABS Take 99 mg by mouth in the morning and at bedtime.     tiZANidine (ZANAFLEX) 4 MG tablet Take by mouth.      topiramate (TOPAMAX) 50 MG tablet Take 1 tablet by mouth at bedtime.     vitamin B-12 (CYANOCOBALAMIN) 1000 MCG tablet Take 1,000 mcg by mouth daily.     ondansetron (ZOFRAN) 8 MG tablet Take 8 mg by mouth every 8 (eight) hours as needed. (Patient not taking: Reported on 07/12/2023)     No current facility-administered medications for this visit.    Review of Systems General:  no complaints Skin: no complaints Eyes: no complaints HEENT: no complaints Breasts: no complaints Pulmonary: no complaints Cardiac: no complaints Gastrointestinal: no complaints Genitourinary/Sexual: no complaints Ob/Gyn: no complaints Musculoskeletal: no complaints Hematology: no complaints Neurologic/Psych: no complaints   Objective:  Physical Examination:  Wt 275 lb 9.6 oz (125 kg)   BMI 48.82 kg/m    ECOG Performance Status: 0 - Asymptomatic  GENERAL: Patient is a well appearing female in no acute distress HEENT:  Sclera clear. Anicteric NODES:  Negative axillary, supraclavicular, inguinal lymph node survery LUNGS:  Clear to auscultation bilaterally.   HEART:  Regular rate and rhythm.  ABDOMEN:  Soft, nontender.  No masses or ascites approximately 1 cm well circumscribed, oval shaped firm lump in dermis of right lower abdomen. Approximately 2-3 cm bruise  adjacent. Nontender. Mobile.  EXTREMITIES:  No peripheral edema. Atraumatic. No cyanosis SKIN:  Clear with no obvious rashes or skin changes.  NEURO:  Nonfocal. Well oriented.  Appropriate affect.  Pelvic: exam chaperoned by nurse;  Vulva: atrophic and mucosa intact.  No lesions seen. Vagina: normal; Adnexa: normal adnexa in size, nontender and no masses; Uterus: uterus is normal size, shape, consistency and nontender; Cervix: anteverted; Rectal: not indicated  Lab Review:  No labs on site today  Imaging Review: Per hpi   Assessment:  Rebekah Perkins is a 67 y.o. female diagnosed with large area of VIN3 and squamous cell cancer of the vulva  4/23.  PET/CT negative for metastatic disease. Completed vulvar radiation 7/23. Follow up PET was negative in October 2023. NED since. Clinically asymptomatic. No evidence  of disease on exam   Abdominal mass- benign appearing  Medical co-morbidities complicating care: Morbid obesity, Sleep apnea, HTN.  Plan:   Problem List Items Addressed This Visit   None Visit Diagnoses     Encounter for follow-up surveillance of vulvar cancer    -  Primary      Abdominal mass- benign appearing. Possible lipoma? Monitor. If enlarging or changing, notify clinic for reevaluation, possible ultrasound.   She has not see Dr Marinell Blight for surveillance and prefers to stay local. We will plan to see her back in 6 months for surveillance and sooner if any concerning symptoms arise. She will continue to see Dr Logan Bores for her well woman care.   The patient's diagnosis, an outline of the further diagnostic and laboratory studies which will be required, the recommendation, and alternatives were discussed.  All questions were answered to the patient's satisfaction.  Consuello Masse, DNP, AGNP-C, AOCNP [facility] : [facility] [phone] (clinic)  I personally interviewed and examined the patient. Agreed with the above/below plan of care. I have directly contributed to assessment and plan of care of this patient and educated and discussed with patient and family.  Leida Lauth, MD   CC:  Judithann Sheen Duane Lope, MD [address] [facility] [place],  [place] [address] [phone]  Dr Logan Bores

## 2023-09-27 ENCOUNTER — Other Ambulatory Visit: Payer: Self-pay | Admitting: Internal Medicine

## 2023-09-27 DIAGNOSIS — Z1231 Encounter for screening mammogram for malignant neoplasm of breast: Secondary | ICD-10-CM

## 2023-10-24 ENCOUNTER — Ambulatory Visit
Admission: RE | Admit: 2023-10-24 | Discharge: 2023-10-24 | Disposition: A | Discharge status: Home / Self Care | Payer: Medicare HMO | Source: Ambulatory Visit | Attending: Internal Medicine | Admitting: Internal Medicine

## 2023-10-24 DIAGNOSIS — Z1231 Encounter for screening mammogram for malignant neoplasm of breast: Secondary | ICD-10-CM | POA: Diagnosis present

## 2024-01-17 ENCOUNTER — Inpatient Hospital Stay: Payer: Medicare HMO | Attending: Obstetrics and Gynecology | Admitting: Obstetrics and Gynecology

## 2024-01-17 ENCOUNTER — Encounter: Payer: Self-pay | Admitting: Obstetrics and Gynecology

## 2024-01-17 VITALS — BP 119/73 | HR 72 | Temp 98.6°F | Resp 20 | Wt 273.3 lb

## 2024-01-17 DIAGNOSIS — N9089 Other specified noninflammatory disorders of vulva and perineum: Secondary | ICD-10-CM | POA: Insufficient documentation

## 2024-01-17 DIAGNOSIS — Z9221 Personal history of antineoplastic chemotherapy: Secondary | ICD-10-CM | POA: Diagnosis not present

## 2024-01-17 DIAGNOSIS — N900 Mild vulvar dysplasia: Secondary | ICD-10-CM | POA: Insufficient documentation

## 2024-01-17 DIAGNOSIS — Z8544 Personal history of malignant neoplasm of other female genital organs: Secondary | ICD-10-CM | POA: Insufficient documentation

## 2024-01-17 DIAGNOSIS — Z08 Encounter for follow-up examination after completed treatment for malignant neoplasm: Secondary | ICD-10-CM | POA: Diagnosis not present

## 2024-01-17 DIAGNOSIS — R3911 Hesitancy of micturition: Secondary | ICD-10-CM | POA: Diagnosis not present

## 2024-01-17 DIAGNOSIS — Z923 Personal history of irradiation: Secondary | ICD-10-CM | POA: Insufficient documentation

## 2024-01-17 DIAGNOSIS — N898 Other specified noninflammatory disorders of vagina: Secondary | ICD-10-CM | POA: Insufficient documentation

## 2024-01-17 MED ORDER — ESTRADIOL 0.1 MG/GM VA CREA: TOPICAL_CREAM | VAGINAL | 0 refills | Status: DC

## 2024-01-17 NOTE — Patient Instructions (Signed)
 I've sent a prescription for estrogen cream to your pharmacy. If this medication is > $50, please let me know before filling it as a different form (a tablet) might be better covered by your insurance. Please let me know if you have any concerns or questions. [phone].  Consuello Masse, NP

## 2024-01-17 NOTE — Addendum Note (Signed)
 Addended by: Ladora Daniel D on: 01/17/2024 03:50 PM   Modules accepted: Orders

## 2024-01-17 NOTE — Progress Notes (Signed)
 Gynecologic Oncology Interval Visit   Referring Provider: Dr Logan Bores  Chief Concern: vulvar cancer, s/p radiation  Subjective:  Rebekah Perkins is a 68 y.o. female who is seen in consultation from Dr. Logan Bores for VIN3 and invasive vulvar cancer, medically inoperable (cT1bN0), stage IB poorly differentiated invasive squamous cell carcinoma s/p radiation and boost with concurrent cisplatin, at [facility] with Dr. Marinell Blight and Dr. Johnnette Litter, completed 05/27/22, with negative PET post completion, who returns to clinic for surveillance.   She has not seen gynecology or rad-onc in interim.   Last pap 02/07/23- ASCUS HR HPV Negative. No history of abnormal pap previously.   She complains of urinary hesitancy since radiation. No burning or incontinence. Not using dilators.     Gyn Oncology History She presented to Dr Logan Bores for her annual examination 02/04/22.  She states that she was doing well since last year but in February she had 3 to 4 days of vaginal bleeding "almost like a period".  Then again early in March she had 1 day of bleeding.  She says she feels like "something is tearing down there". She continues to use estrogen vaginal cream every other day.  Vulvar exam shows posterior fourchette with large apparently kissing lesion.  Very friable.  Erythematous.   Biopsy of vulva: At least intraepithelial squamous cell carcinoma (Carcinoma in-situ / VIN III). Invasive carcinoma cannot be excluded in the submitted biopsy due to superficial nature of the biopsy.   Seen by Gyn Oncology  02/16/22 VULVA, LEFT; BIOPSY: INVASIVE SQUAMOUS CELL CARCINOMA, P16 POSITIVE.  Sections demonstrate an invasive poorly differentiated malignant neoplasm that focally undermines intact squamous mucosa. A limited panel of immunohistochemical stains was performed. The malignant neoplasm is positive for pancytokeratin, p63, and p16 (diffuse, block-like). This pattern of immunoreactivity supports the above diagnosis.   PET/CT 03/18/22 1.  Focal uptake at the left posterior peritoneum/multiple compatible with known primary malignancy.  2. No evidence of metastatic disease.  3. Mildly enlarged thyroid gland with diffusely increased uptake suggestive of Graves' disease or thyroiditis. Correlate clinically.   In view of diffuse involvement of vulva with VIN and invasive cancer decision made to treat with radiation.   Treated by Dr Marinell Blight at [facility], IMRT 10X 5040cGy 48 180cGy 03/30/22-05/16/22  Vulva boost, IMRT 10X 1600cGy 10 200cGy 05/18/22-05/27/22 Total dose 6640cGy 59   08/31/22- PET  IMPRESSION:  1. Resolved hypermetabolic activity in the left vulva.  2.  No evidence of FDG avid metastatic disease.  3.  Findings suggestive of thyroiditis     Problem List: Patient Active Problem List   Diagnosis Date Noted   Encounter for follow-up surveillance of vulvar cancer 01/17/2024   Vulvar lesion 01/17/2024   Vaginal dryness 01/17/2024   Vulvar cancer, carcinoma (HCC) 02/15/2022   Screening for colon cancer    Chronic GERD    BMI 45.0-49.9, adult (HCC) 12/03/2019   Endometrial hyperplasia without atypia, simple 07/21/2015   Diverticulosis 07/21/2015   Neuritis or radiculitis due to rupture of lumbar intervertebral disc 11/12/2014   Lumbar canal stenosis 11/12/2014   Bursitis, trochanteric 11/12/2014   Back ache 04/10/2014   Chronic anemia 04/10/2014   BP (high blood pressure) 04/10/2014   Blood glucose elevated 04/10/2014   HLD (hyperlipidemia) 04/10/2014   Adult hypothyroidism 04/10/2014   Adiposity 04/10/2014   Obstructive apnea 04/10/2014    Past Medical History: Past Medical History:  Diagnosis Date   Anxiety    Apnea    Complication of anesthesia    slow  to wake up afterwards   Diverticulitis    Environmental allergies    Hypertension    Menopausal state    Obesity    PMB (postmenopausal bleeding)    PVD (peripheral vascular disease) (HCC)    Rosacea    Simple endometrial hyperplasia    Sleep  apnea    Thyroid disease    Urinary urgency     Past Surgical History: Past Surgical History:  Procedure Laterality Date   CHOLECYSTECTOMY     COLONOSCOPY WITH PROPOFOL N/A 05/05/2021   Procedure: COLONOSCOPY WITH PROPOFOL;  Surgeon: Toney Reil, MD;  Location: [facility] ENDOSCOPY;  Service: Gastroenterology;  Laterality: N/A;   DILATION AND CURETTAGE OF UTERUS     ESOPHAGOGASTRODUODENOSCOPY (EGD) WITH PROPOFOL N/A 02/03/2021   Procedure: ESOPHAGOGASTRODUODENOSCOPY (EGD) WITH PROPOFOL;  Surgeon: Toney Reil, MD;  Location: [facility] ENDOSCOPY;  Service: Gastroenterology;  Laterality: N/A;   HYSTEROSCOPY      OB History:  OB History  Gravida Para Term Preterm AB Living  3 3 3   3   SAB IAB Ectopic Multiple Live Births      3    # Outcome Date GA Lbr Len/2nd Weight Sex Type Anes PTL Lv  3 Term 1978   7 lb 1.6 oz (3.221 kg) M Vag-Spont   LIV  2 Term 1977   7 lb (3.175 kg) M Vag-Spont   LIV  1 Term 1975   7 lb 1.9 oz (3.23 kg) M Vag-Spont   LIV    Family History: Family History  Problem Relation Age of Onset   Diabetes Mother    Heart disease Mother    Diabetes Sister    Breast cancer Sister    Cancer Sister    Breast cancer Maternal Aunt 60   Lung cancer Maternal Aunt    Cancer Maternal Grandmother    Colon cancer Maternal Grandmother    Breast cancer Maternal Grandmother 11   Ovarian cancer Neg Hx     Social History: Social History   Socioeconomic History   Marital status: Divorced    Spouse name: Not on file   Number of children: Not on file   Years of education: Not on file   Highest education level: Not on file  Occupational History   Not on file  Tobacco Use   Smoking status: Never    Passive exposure: Yes   Smokeless tobacco: Never  Vaping Use   Vaping status: Never Used  Substance and Sexual Activity   Alcohol use: No   Drug use: No   Sexual activity: Not Currently  Other Topics Concern   Not on file  Social History Narrative   Lives alone  at home.    Social Drivers of Corporate investment banker Strain: Not on file  Food Insecurity: Not on file  Transportation Needs: Not on file  Physical Activity: Inactive (01/11/2018)   Exercise Vital Sign    Days of Exercise per Week: 0 days    Minutes of Exercise per Session: 0 min  Stress: Not on file  Social Connections: Not on file  Intimate Partner Violence: Not on file    Allergies: Allergies  Allergen Reactions   Azithromycin Itching   Penicillins Itching    Current Medications: Current Outpatient Medications  Medication Sig Dispense Refill   aspirin EC 81 MG tablet Take 81 mg by mouth daily.     bisoprolol-hydrochlorothiazide (ZIAC) 5-6.25 MG tablet Take 1 tablet by mouth once daily 90 tablet 1  calcium carbonate (OS-CAL) 600 MG TABS tablet Take 600 mg by mouth at bedtime.     celecoxib (CELEBREX) 200 MG capsule Take 1 capsule (200 mg total) by mouth 2 (two) times daily 180 capsule 1   cetirizine (ZYRTEC) 10 MG tablet Take 10 mg by mouth at bedtime.     docusate sodium (COLACE) 100 MG capsule Take 100 mg by mouth 2 (two) times daily.     estradiol (ESTRACE VAGINAL) 0.1 MG/GM vaginal cream Place 1 Applicatorful vaginally at bedtime for 14 days, THEN 1 Applicatorful 3 (three) times a week for 16 days. 42.5 g 0   fluticasone (FLONASE) 50 MCG/ACT nasal spray Place 2 sprays into both nostrils daily. 16 g 9   gabapentin (NEURONTIN) 300 MG capsule Take 1 capsule by mouth nightly 90 capsule 1   hydrochlorothiazide (HYDRODIURIL) 25 MG tablet TAKE 1 TABLET BY MOUTH ONCE DAILY 90 tablet 2   isosorbide mononitrate (IMDUR) 30 MG 24 hr tablet TAKE 1 TABLET BY MOUTH DAILY 90 tablet 3   levothyroxine (SYNTHROID) 150 MCG tablet Take 1 tablet (150 mcg total) by mouth once daily. Take on an empty stomach with a glass of water at least 30-60 minutes before breakfast. 90 tablet 1   magnesium oxide (MAG-OX) 400 MG tablet Take 1 tablet by mouth 2 (two) times daily.     Multiple  Vitamins-Minerals (PRESERVISION AREDS 2+MULTI VIT PO) Take 1 capsule by mouth in the morning and at bedtime.     pantoprazole (PROTONIX) 40 MG tablet Take 40 mg by mouth 2 (two) times daily.     tiZANidine (ZANAFLEX) 4 MG tablet Take by mouth.     topiramate (TOPAMAX) 50 MG tablet Take 1 tablet by mouth at bedtime.     vitamin B-12 (CYANOCOBALAMIN) 1000 MCG tablet Take 1,000 mcg by mouth daily.     ondansetron (ZOFRAN) 8 MG tablet Take 8 mg by mouth every 8 (eight) hours as needed. (Patient not taking: Reported on 01/17/2024)     Potassium 99 MG TABS Take 99 mg by mouth in the morning and at bedtime. (Patient not taking: Reported on 01/17/2024)     No current facility-administered medications for this visit.    Review of Systems General:  no complaints Skin: no complaints Eyes: no complaints HEENT: no complaints Breasts: no complaints Pulmonary: no complaints Cardiac: no complaints Gastrointestinal: no complaints Genitourinary/Sexual: no complaints Ob/Gyn: per hpi Musculoskeletal: no complaints Hematology: no complaints Neurologic/Psych: no complaints    Objective:  Physical Examination:  BP 119/73   Pulse 72   Temp 98.6 F (37 C)   Resp 20   Wt 273 lb 4.8 oz (124 kg)   SpO2 100%   BMI 48.41 kg/m    ECOG Performance Status: 0 - Asymptomatic  GENERAL: Patient is a well appearing female in no acute distress HEENT:  Sclera clear. Anicteric NODES:  Negative axillary, supraclavicular, inguinal lymph node survery LUNGS:  Clear to auscultation bilaterally.   HEART:  Regular rate and rhythm.  ABDOMEN: Soft, nontender.  No masses or ascites. No hernias.  EXTREMITIES:  No peripheral edema. Atraumatic. No cyanosis SKIN:  Clear with no obvious rashes or skin changes.  NEURO:  Nonfocal. Well oriented.  Appropriate affect.  Pelvic: exam chaperoned by CMA.  Vulva: atrophic and mucosa intact.  Erythematous lesions right medial aspect with two punctate WE areas; on the left small 1 mm  or less WE area upper medial aspect.  Vagina: agglutinated midway. Digitally opened but unable to reach cervix.  Cervix: Unable to visualize Uterus: Not grossly enlarged but limited exam due to vaginal agglutination and habitus BME: limited exam due to vaginal agglutination and habitus Rectal: not indicated    Vaginal dilation performed with a small dilator.  The patient was taught how to use the vaginal dilator.  Vulvar biopsy The risks and benefits of the procedure were reviewed and informed consent obtained. Time out was performed. The patient received pre-procedure teaching and expressed understanding. The post-procedure instructions were reviewed with the patient and she expressed understanding. The patient does not have any barriers to learning. A right upper lesion was visible as noted above. The area was numbed with hurricane jelly and injected with 2 cc 2% lidocaine. A biopsy forceps was used to obtained a biopsy.  Hemostasis was obtained with silver nitrate. She tolerated the procedure well.    Lab Review:  No labs on site today  Imaging Review: Per hpi   Assessment:  Rebekah Perkins is a 68 y.o. female diagnosed with large area of VIN3 and squamous cell cancer of the vulva 4/23.  PET/CT negative for metastatic disease. Completed vulvar radiation 7/23. Follow up PET was negative in October 2023. NED since. Clinically asymptomatic. No definitive evidence of disease on exam.  Vulvar erythema of uncertain etiology with the white epithelial areas were biopsied.  Vaginal dryness and agglutination  Abdominal mass- benign appearing  ASCUS HPV negative  Medical co-morbidities complicating care: Morbid obesity, Sleep apnea, HTN.  Plan:   Problem List Items Addressed This Visit       Genitourinary   Vaginal dryness   Vulvar lesion     Other   Encounter for follow-up surveillance of vulvar cancer - Primary    Abdominal mass- benign appearing. Possible lipoma? Monitor. If  enlarging or changing, notify clinic for reevaluation, possible ultrasound.   Follow-up biopsies. Discussed vulvar care clean BID  Continue vaginal dilator therapy daily every 10 minutes as directed on the package insert.  Initiate vaginal estrogen therapy 2-3 times per week.  Teaching performed.  Repeat Pap smear in March 2026  Follow-up in 6 months for continued close surveillance.  The patient's diagnosis, an outline of the further diagnostic and laboratory studies which will be required, the recommendation, and alternatives were discussed.  All questions were answered to the patient's satisfaction.  Consuello Masse, DNP, AGNP-C, AOCNP [facility] [phone] (clinic)   I personally had a face to face interaction and evaluated the patient jointly with the NP, Ms. Consuello Masse.  I have reviewed her history and available records and have performed the key portions of the physical exam including lymph node survey, pelvic exam and vulvar biopsy with my findings confirming those documented above by the APP.  I have discussed the case with the APP and the patient.  I agree with the above documentation, assessment and plan which was fully formulated by me.  Counseling was completed by me.   I personally saw the patient and performed a substantive portion of this encounter in conjunction with the listed APP as documented above.  Hasan Douse Leta Jungling, MD

## 2024-01-19 LAB — SURGICAL PATHOLOGY

## 2024-01-25 ENCOUNTER — Inpatient Hospital Stay: Admitting: Nurse Practitioner

## 2024-01-25 ENCOUNTER — Encounter: Payer: Self-pay | Admitting: Nurse Practitioner

## 2024-01-25 VITALS — BP 110/71 | HR 72 | Temp 98.6°F | Resp 18 | Wt 271.2 lb

## 2024-01-25 DIAGNOSIS — R8769 Abnormal cytological findings in specimens from other female genital organs: Secondary | ICD-10-CM | POA: Diagnosis not present

## 2024-01-25 DIAGNOSIS — Z08 Encounter for follow-up examination after completed treatment for malignant neoplasm: Secondary | ICD-10-CM | POA: Diagnosis not present

## 2024-01-25 DIAGNOSIS — J301 Allergic rhinitis due to pollen: Secondary | ICD-10-CM | POA: Diagnosis not present

## 2024-01-25 MED ORDER — AZELASTINE HCL 0.1 % NA SOLN: 2.0000 | Freq: Two times a day (BID) | NASAL | 3 refills | Status: AC

## 2024-01-25 NOTE — Progress Notes (Signed)
 Gynecologic Oncology Interval Visit   Referring Provider: Dr Logan Bores  Chief Concern: vulvar cancer, s/p radiation  Subjective:  Rebekah Perkins is a 68 y.o. female who is seen in consultation from Dr. Logan Bores for VIN3 and invasive vulvar cancer, medically inoperable (cT1bN0), stage IB poorly differentiated invasive squamous cell carcinoma s/p radiation and boost with concurrent cisplatin, at [facility] with Dr. Marinell Blight and Dr. Johnnette Litter, completed 05/27/22, with negative PET post completion, who returns to clinic for biopsy check and discussion of pathology.   She complains of difficulty keeping dilator in vagina. Has some irritation and tenderness at biopsy site. Has headache, congestion, and rhinitis today thought due to allergies.   Last pap 02/07/23- ASCUS HR HPV Negative. No history of abnormal pap previously.       Gyn Oncology History She presented to Dr Logan Bores for her annual examination 02/04/22.  She states that she was doing well since last year but in February she had 3 to 4 days of vaginal bleeding "almost like a period".  Then again early in March she had 1 day of bleeding.  She says she feels like "something is tearing down there". She continues to use estrogen vaginal cream every other day.  Vulvar exam shows posterior fourchette with large apparently kissing lesion.  Very friable.  Erythematous.   Biopsy of vulva: At least intraepithelial squamous cell carcinoma (Carcinoma in-situ / VIN III). Invasive carcinoma cannot be excluded in the submitted biopsy due to superficial nature of the biopsy.   Seen by Gyn Oncology  02/16/22 VULVA, LEFT; BIOPSY: INVASIVE SQUAMOUS CELL CARCINOMA, P16 POSITIVE.  Sections demonstrate an invasive poorly differentiated malignant neoplasm that focally undermines intact squamous mucosa. A limited panel of immunohistochemical stains was performed. The malignant neoplasm is positive for pancytokeratin, p63, and p16 (diffuse, block-like). This pattern of immunoreactivity  supports the above diagnosis.   PET/CT 03/18/22 1. Focal uptake at the left posterior peritoneum/multiple compatible with known primary malignancy.  2. No evidence of metastatic disease.  3. Mildly enlarged thyroid gland with diffusely increased uptake suggestive of Graves' disease or thyroiditis. Correlate clinically.   In view of diffuse involvement of vulva with VIN and invasive cancer decision made to treat with radiation.   Treated by Dr Marinell Blight at [facility], IMRT 10X 5040cGy 48 180cGy 03/30/22-05/16/22  Vulva boost, IMRT 10X 1600cGy 10 200cGy 05/18/22-05/27/22 Total dose 6640cGy 59   08/31/22- PET  IMPRESSION:  1. Resolved hypermetabolic activity in the left vulva.  2.  No evidence of FDG avid metastatic disease.  3.  Findings suggestive of thyroiditis    Problem List: Patient Active Problem List   Diagnosis Date Noted   Vulvar low-grade squamous intraepithelial lesion (LGSIL) 01/25/2024   Encounter for follow-up surveillance of vulvar cancer 01/17/2024   Vulvar lesion 01/17/2024   Vaginal dryness 01/17/2024   Vulvar cancer, carcinoma (HCC) 02/15/2022   Screening for colon cancer    Chronic GERD    BMI 45.0-49.9, adult (HCC) 12/03/2019   Endometrial hyperplasia without atypia, simple 07/21/2015   Diverticulosis 07/21/2015   Neuritis or radiculitis due to rupture of lumbar intervertebral disc 11/12/2014   Lumbar canal stenosis 11/12/2014   Bursitis, trochanteric 11/12/2014   Back ache 04/10/2014   Chronic anemia 04/10/2014   BP (high blood pressure) 04/10/2014   Blood glucose elevated 04/10/2014   HLD (hyperlipidemia) 04/10/2014   Adult hypothyroidism 04/10/2014   Adiposity 04/10/2014   Obstructive apnea 04/10/2014    Past Medical History: Past Medical History:  Diagnosis Date  Anxiety    Apnea    Complication of anesthesia    slow to wake up afterwards   Diverticulitis    Environmental allergies    Hypertension    Menopausal state    Obesity    PMB  (postmenopausal bleeding)    PVD (peripheral vascular disease) (HCC)    Rosacea    Simple endometrial hyperplasia    Sleep apnea    Thyroid disease    Urinary urgency     Past Surgical History: Past Surgical History:  Procedure Laterality Date   CHOLECYSTECTOMY     COLONOSCOPY WITH PROPOFOL N/A 05/05/2021   Procedure: COLONOSCOPY WITH PROPOFOL;  Surgeon: Toney Reil, MD;  Location: [facility] ENDOSCOPY;  Service: Gastroenterology;  Laterality: N/A;   DILATION AND CURETTAGE OF UTERUS     ESOPHAGOGASTRODUODENOSCOPY (EGD) WITH PROPOFOL N/A 02/03/2021   Procedure: ESOPHAGOGASTRODUODENOSCOPY (EGD) WITH PROPOFOL;  Surgeon: Toney Reil, MD;  Location: [facility] ENDOSCOPY;  Service: Gastroenterology;  Laterality: N/A;   HYSTEROSCOPY      OB History:  OB History  Gravida Para Term Preterm AB Living  3 3 3   3   SAB IAB Ectopic Multiple Live Births      3    # Outcome Date GA Lbr Len/2nd Weight Sex Type Anes PTL Lv  3 Term 1978   7 lb 1.6 oz (3.221 kg) M Vag-Spont   LIV  2 Term 1977   7 lb (3.175 kg) M Vag-Spont   LIV  1 Term 1975   7 lb 1.9 oz (3.23 kg) M Vag-Spont   LIV    Family History: Family History  Problem Relation Age of Onset   Diabetes Mother    Heart disease Mother    Diabetes Sister    Breast cancer Sister    Cancer Sister    Breast cancer Maternal Aunt 60   Lung cancer Maternal Aunt    Cancer Maternal Grandmother    Colon cancer Maternal Grandmother    Breast cancer Maternal Grandmother 8   Ovarian cancer Neg Hx     Social History: Social History   Socioeconomic History   Marital status: Divorced    Spouse name: Not on file   Number of children: Not on file   Years of education: Not on file   Highest education level: Not on file  Occupational History   Not on file  Tobacco Use   Smoking status: Never    Passive exposure: Yes   Smokeless tobacco: Never  Vaping Use   Vaping status: Never Used  Substance and Sexual Activity   Alcohol use: No    Drug use: No   Sexual activity: Not Currently  Other Topics Concern   Not on file  Social History Narrative   Lives alone at home.    Social Drivers of Corporate investment banker Strain: Not on file  Food Insecurity: Not on file  Transportation Needs: Not on file  Physical Activity: Inactive (01/11/2018)   Exercise Vital Sign    Days of Exercise per Week: 0 days    Minutes of Exercise per Session: 0 min  Stress: Not on file  Social Connections: Not on file  Intimate Partner Violence: Not on file    Allergies: Allergies  Allergen Reactions   Azithromycin Itching   Penicillins Itching    Current Medications: Current Outpatient Medications  Medication Sig Dispense Refill   aspirin EC 81 MG tablet Take 81 mg by mouth daily.     azelastine (  ASTELIN) 0.1 % nasal spray Place 2 sprays into both nostrils 2 (two) times daily. For seasonal allergies 30 mL 3   bisoprolol-hydrochlorothiazide (ZIAC) 5-6.25 MG tablet Take 1 tablet by mouth once daily 90 tablet 1   calcium carbonate (OS-CAL) 600 MG TABS tablet Take 600 mg by mouth at bedtime.     celecoxib (CELEBREX) 200 MG capsule Take 1 capsule (200 mg total) by mouth 2 (two) times daily 180 capsule 1   cetirizine (ZYRTEC) 10 MG tablet Take 10 mg by mouth at bedtime.     docusate sodium (COLACE) 100 MG capsule Take 100 mg by mouth 2 (two) times daily.     estradiol (ESTRACE VAGINAL) 0.1 MG/GM vaginal cream Place 1 Applicatorful vaginally at bedtime for 14 days, THEN 1 Applicatorful 3 (three) times a week for 16 days. 42.5 g 0   fluticasone (FLONASE) 50 MCG/ACT nasal spray Place 2 sprays into both nostrils daily. 16 g 9   gabapentin (NEURONTIN) 300 MG capsule Take 1 capsule by mouth nightly 90 capsule 1   hydrochlorothiazide (HYDRODIURIL) 25 MG tablet TAKE 1 TABLET BY MOUTH ONCE DAILY 90 tablet 2   isosorbide mononitrate (IMDUR) 30 MG 24 hr tablet TAKE 1 TABLET BY MOUTH DAILY 90 tablet 3   levothyroxine (SYNTHROID) 150 MCG tablet Take 1  tablet (150 mcg total) by mouth once daily. Take on an empty stomach with a glass of water at least 30-60 minutes before breakfast. 90 tablet 1   magnesium oxide (MAG-OX) 400 MG tablet Take 1 tablet by mouth 2 (two) times daily.     Multiple Vitamins-Minerals (PRESERVISION AREDS 2+MULTI VIT PO) Take 1 capsule by mouth in the morning and at bedtime.     pantoprazole (PROTONIX) 40 MG tablet Take 40 mg by mouth 2 (two) times daily.     tiZANidine (ZANAFLEX) 4 MG tablet Take by mouth.     topiramate (TOPAMAX) 50 MG tablet Take 1 tablet by mouth at bedtime.     vitamin B-12 (CYANOCOBALAMIN) 1000 MCG tablet Take 1,000 mcg by mouth daily.     ondansetron (ZOFRAN) 8 MG tablet Take 8 mg by mouth every 8 (eight) hours as needed. (Patient not taking: Reported on 07/12/2023)     Potassium 99 MG TABS Take 99 mg by mouth in the morning and at bedtime. (Patient not taking: Reported on 01/17/2024)     No current facility-administered medications for this visit.    Review of Systems General:  rhinitis and congestion Skin: no complaints Eyes: no complaints HEENT: no complaints Breasts: no complaints Pulmonary: no complaints Cardiac: no complaints Gastrointestinal: no complaints Genitourinary/Sexual: per hpi Ob/Gyn: per hpi Musculoskeletal: no complaints Hematology: no complaints Neurologic/Psych: no complaints    Objective:  Physical Examination:  BP 110/71   Pulse 72   Temp 98.6 F (37 C)   Resp 18   Wt 271 lb 3.2 oz (123 kg)   SpO2 96%   BMI 48.04 kg/m    ECOG Performance Status: 0 - Asymptomatic  GENERAL: Patient is a well appearing female in no acute distress HEENT: rhinitis LUNGS:  Clear to auscultation bilaterally.   SKIN:  Clear with no obvious rashes or skin changes.  NEURO:  Nonfocal. Well oriented.  Appropriate affect.  Pelvic:  Vulva: atrophic. Dry. Erythematous lesions scattered across labia majora. Not bothersome. Labia minor right biopsy site healing in well w/o evidence  of infection.  Speculum and BME deferred  Lab Review:  No labs on site today  Imaging Review: No  imaging on site   Assessment:  Rebekah Perkins is a 68 y.o. female diagnosed with large area of VIN3 and squamous cell cancer of the vulva 4/23.  PET/CT negative for metastatic disease. Completed vulvar radiation 7/23. Follow up PET was negative in October 2023. NED since. Clinically asymptomatic. No definitive evidence of disease on exam.  Vulvar erythema of uncertain etiology with the white epithelial areas were biopsied. Pathology consistent with LSIL/VIN1. Biopsy site healing well. No evidence of infection.   Vaginal dryness and agglutination- estradiol and dilator therapy  Abdominal mass- benign appearing  ASCUS HPV negative  Medical co-morbidities complicating care: Morbid obesity, Sleep apnea, HTN.  Plan:   Problem List Items Addressed This Visit       Genitourinary   Vulvar low-grade squamous intraepithelial lesion (LGSIL) - Primary   Other Visit Diagnoses       Seasonal allergic rhinitis due to pollen          Continue vulvar care. Biopsy site is healing well. Pathology consistent with LSIL/VIN1. We will hold off on management for now and allow the lesion to continue to heal. Plan to see her back in 4-6 weeks to recheck site and discuss management.   Continue vaginal dilator therapy daily x 10 minutes. Continue vaginal estrogen therapy 2-3 nights a week. Vulvar irritation- unclear if related to local reaction to estrogen therapy vs other etiologies. Monitor fo rnow. If worsening, recommend reassessment. Could hold therapy then consider rotating to premarin.   Repeat pap March 2026.   Abdominal mass- benign appearing. Possible lipoma? Monitor. If enlarging or changing, notify clinic for reevaluation, possible ultrasound.   Seasonal allergies- rotate from zyrtec to allegra. Prescription fo razelastine nasal spray sent to pharmacy but also available otc. Continue flonase.    Disposition:  4-6 weeks- see Dr Sonia Side for reevaluation and management options- la  The patient's diagnosis, an outline of the further diagnostic and laboratory studies which will be required, the recommendation, and alternatives were discussed.  All questions were answered to the patient's satisfaction.  Consuello Masse, DNP, AGNP-C, AOCNP [facility] [phone] (clinic)

## 2024-01-25 NOTE — Patient Instructions (Addendum)
 Sorry your allergies are bothering you. Try rotating from zyrtec to allegra to see if your symptoms improve. Continue flonase. I've also sent a antihistamine nasal spray for you called Azelastine. It's also available over the counter as Astepro or Astepro Allergy.  Monitor your vulva for worsening irritation. Continue vulva hygiene as the biopsy site heals. I'll see you back in a few weeks to recheck the site and talk about management of the LSIL. Please let me know if you have questions or concerns in the meantime. Great to see you.  Leotis Shames, NP

## 2024-02-28 ENCOUNTER — Encounter: Payer: Self-pay | Admitting: Obstetrics and Gynecology

## 2024-02-28 ENCOUNTER — Inpatient Hospital Stay: Attending: Obstetrics and Gynecology | Admitting: Obstetrics and Gynecology

## 2024-02-28 VITALS — BP 126/74 | HR 70 | Temp 98.6°F | Resp 20 | Wt 274.4 lb

## 2024-02-28 DIAGNOSIS — Z7189 Other specified counseling: Secondary | ICD-10-CM

## 2024-02-28 DIAGNOSIS — Z9221 Personal history of antineoplastic chemotherapy: Secondary | ICD-10-CM | POA: Insufficient documentation

## 2024-02-28 DIAGNOSIS — Z923 Personal history of irradiation: Secondary | ICD-10-CM | POA: Insufficient documentation

## 2024-02-28 DIAGNOSIS — N952 Postmenopausal atrophic vaginitis: Secondary | ICD-10-CM | POA: Diagnosis not present

## 2024-02-28 DIAGNOSIS — Z08 Encounter for follow-up examination after completed treatment for malignant neoplasm: Secondary | ICD-10-CM | POA: Insufficient documentation

## 2024-02-28 DIAGNOSIS — Z8544 Personal history of malignant neoplasm of other female genital organs: Secondary | ICD-10-CM | POA: Insufficient documentation

## 2024-02-28 DIAGNOSIS — Z79890 Hormone replacement therapy (postmenopausal): Secondary | ICD-10-CM | POA: Insufficient documentation

## 2024-02-28 DIAGNOSIS — N900 Mild vulvar dysplasia: Secondary | ICD-10-CM | POA: Insufficient documentation

## 2024-02-28 DIAGNOSIS — R8769 Abnormal cytological findings in specimens from other female genital organs: Secondary | ICD-10-CM

## 2024-02-28 MED ORDER — IMIQUIMOD 5 % EX CREA: TOPICAL_CREAM | CUTANEOUS | 0 refills | Status: DC

## 2024-02-28 NOTE — Patient Instructions (Signed)
 Vulvar Aldara Cream Application  INSTRUCTIONS Apply the Aldara cream by applying thin layer topically to vulva at bedtime every other night at bedtime.  Leave on skin overnight then removed with soap and water in the morning.  Leave on skin for 6-10 hours.   To avoid the cream from getting on nontreatment sites, you can apply Desitin or Vaseline to nontreated skin surfaces to avoid contact. We recommend handwashing before and after cream application.  Avoid using excessive amounts of cream.  Aldara 5% cream was packaged and single-use packets which contain sufficient cream to cover the area.  Apply the cream externally only.  Apply a thin layer of cream and rub into the affected area until it is no longer visible.  The application site should not be covered. Local skin reactions including redness, swelling, discomfort, at the treatment site are common.  A rest period of several days may be taken if needed to allow the skin to rest.  Treatment may resume once the reaction subsides.     Imiquimod Cream What is this medication? IMIQUIMOD (i mi KWI mod) treats rough, scaly spots on the skin caused by sun exposure. It may also treat warts on and around the genital and rectal areas caused by a virus. It does not kill the virus and it may still be possible to spread the virus to others. It will not treat infections caused by bacteria. This medicine may be used for other purposes; ask your health care provider or pharmacist if you have questions. COMMON BRAND NAME(S): Aldara, Zyclara What should I tell my care team before I take this medication? They need to know if you have any of these conditions: Immune system problems Large areas of burned or damaged skin An unusual or allergic reaction to imiquimod, other medications, foods, dyes, or preservatives Pregnant or trying to get pregnant Breast-feeding How should I use this medication? This medication is for external use only. Do not take by mouth. Wash  hands before and after use. Do not get it in your eyes. If you do, rinse your eyes with plenty of cool tap water. Use it as directed on the prescription label. Do not use it more often than directed. Use the medication for the full course as directed by your care team, even if you think you are better. Do not stop using it unless your care team tells you to stop it early. Apply a thin film of medication to the affected area. Talk to your care team about the use of this medication in children. While it may be prescribed for children as young as 12 years for selected conditions, precautions do apply. Overdosage: If you think you have taken too much of this medicine contact a poison control center or emergency room at once. NOTE: This medicine is only for you. Do not share this medicine with others. What if I miss a dose? If you miss a dose, use it as soon as you can. If it is almost time for your next dose, use only that dose. Do not use double or extra doses. What may interact with this medication? Interactions are not expected. Do not use any other skin products on the same area of skin without talking to your care team. This list may not describe all possible interactions. Give your health care provider a list of all the medicines, herbs, non-prescription drugs, or dietary supplements you use. Also tell them if you smoke, drink alcohol, or use illegal drugs. Some items may  interact with your medicine. What should I watch for while using this medication? Visit your care team for regular checks on your progress. Tell your care team if your symptoms do not start to get better or if they get worse. If you are being treated for a sexually transmitted infection (STI), avoid sexual contact until you have finished your treatment. Your partner may also need treatment. This medication can damage and reduce the effect of latex-containing products, such as condoms and diaphragms. Avoid contact of this medication  with latex-containing products; throw away any products that are exposed to this medication. This medication can make you more sensitive to the sun. Keep out of the sun. If you cannot avoid being in the sun, wear protective clothing and sunscreen. Do not use sun lamps or tanning beds/booths. What side effects may I notice from receiving this medication? Side effects that you should report to your care team as soon as possible: Allergic reactions--skin rash, itching, hives, swelling of the face, lips, tongue, or throat Burning, itching, crusting, or peeling of treated skin Flu-like symptoms--fever, chills, muscle pain, cough, headache, fatigue Vaginal irritation at application site Painful swelling, warmth, or redness of the skin, blisters or sores Side effects that usually do not require medical attention (report to your care team if they continue or are bothersome): Change in skin color Mild skin irritation, redness, or dryness This list may not describe all possible side effects. Call your doctor for medical advice about side effects. You may report side effects to FDA at 1-800-FDA-1088. Where should I keep my medication? Keep out of the reach of children and pets. See product for storage information. Each product may have different instructions. Get rid of any unused medication after the expiration date. To get rid of medications that are no longer needed or have expired: Take the medication to a medication take-back program. Check with your pharmacy or law enforcement to find a location. If you cannot return the medication, check the label or package insert to see if the medication should be thrown out in the garbage or flushed down the toilet. If you are not sure, ask your care team. If it is safe to put it in the trash, empty the medication out of the container. Mix the medication with cat litter, dirt, coffee grounds, or other unwanted substance. Seal the mixture in a bag or container. Put  it in the trash. NOTE: This sheet is a summary. It may not cover all possible information. If you have questions about this medicine, talk to your doctor, pharmacist, or health care provider.  2024 Elsevier/Gold Standard (2022-02-22 00:00:00)

## 2024-02-28 NOTE — Progress Notes (Signed)
 Gynecologic Oncology Interval Visit   Referring Provider: Dr Luster Salters  Chief Concern: vulvar cancer, s/p radiation  Subjective:  Rebekah Perkins is a 68 y.o. female who is seen in consultation from Dr. Luster Salters for VIN3 and invasive vulvar cancer, medically inoperable (cT1bN0), stage IB poorly differentiated invasive squamous cell carcinoma s/p radiation and boost with concurrent cisplatin, at [facility] with Dr. Americo Baker and Dr. Marella Shams, completed 05/27/22, with negative PET post completion, who returns to clinic for follow up of vulvar biopsy.    Last pap 02/07/23- ASCUS HR HPV Negative. No history of abnormal pap previously.   She had a Biopsy on 01/17/2024 to assess vulvar irritation and area on exam notable for erythema and leukoplakia on the right vulva.    1. Vulva, biopsy, right, lesion :       - LOW-GRADE SQUAMOUS INTRAEPITHELIAL LESION (LSIL/VIN-1).  She had a biopsy check on 01/25/2024 and noted that biopsy site is healing well.    She was also started on vaginal dilator therapy daily x 10 minutes, but is having difficulty with that, and on 01/25/2024 she started vaginal estrogen therapy 2-3 nights a week.       Gyn Oncology History She presented to Dr Luster Salters for her annual examination 02/04/22.  She states that she was doing well since last year but in February she had 3 to 4 days of vaginal bleeding "almost like a period".  Then again early in March she had 1 day of bleeding.  She says she feels like "something is tearing down there". She continues to use estrogen vaginal cream every other day.  Vulvar exam shows posterior fourchette with large apparently kissing lesion.  Very friable.  Erythematous.   Biopsy of vulva: At least intraepithelial squamous cell carcinoma (Carcinoma in-situ / VIN III). Invasive carcinoma cannot be excluded in the submitted biopsy due to superficial nature of the biopsy.   Seen by Gyn Oncology  02/16/22 VULVA, LEFT; BIOPSY: INVASIVE SQUAMOUS CELL CARCINOMA, P16 POSITIVE.   Sections demonstrate an invasive poorly differentiated malignant neoplasm that focally undermines intact squamous mucosa. A limited panel of immunohistochemical stains was performed. The malignant neoplasm is positive for pancytokeratin, p63, and p16 (diffuse, block-like). This pattern of immunoreactivity supports the above diagnosis.   PET/CT 03/18/22 1. Focal uptake at the left posterior peritoneum/multiple compatible with known primary malignancy.  2. No evidence of metastatic disease.  3. Mildly enlarged thyroid gland with diffusely increased uptake suggestive of Graves' disease or thyroiditis. Correlate clinically.   In view of diffuse involvement of vulva with VIN and invasive cancer decision made to treat with radiation.   Treated by Dr Americo Baker at [facility], IMRT 10X 5040cGy 48 180cGy 03/30/22-05/16/22  Vulva boost, IMRT 10X 1600cGy 10 200cGy 05/18/22-05/27/22 Total dose 6640cGy 59   08/31/22- PET  IMPRESSION:  1. Resolved hypermetabolic activity in the left vulva.  2.  No evidence of FDG avid metastatic disease.  3.  Findings suggestive of thyroiditis     Problem List: Patient Active Problem List   Diagnosis Date Noted   Vulvar low-grade squamous intraepithelial lesion (LGSIL) 01/25/2024   Encounter for follow-up surveillance of vulvar cancer 01/17/2024   Vulvar lesion 01/17/2024   Vaginal dryness 01/17/2024   Vulvar cancer, carcinoma (HCC) 02/15/2022   Screening for colon cancer    Chronic GERD    BMI 45.0-49.9, adult (HCC) 12/03/2019   Endometrial hyperplasia without atypia, simple 07/21/2015   Diverticulosis 07/21/2015   Neuritis or radiculitis due to rupture of lumbar intervertebral  disc 11/12/2014   Lumbar canal stenosis 11/12/2014   Bursitis, trochanteric 11/12/2014   Back ache 04/10/2014   Chronic anemia 04/10/2014   BP (high blood pressure) 04/10/2014   Blood glucose elevated 04/10/2014   HLD (hyperlipidemia) 04/10/2014   Adult hypothyroidism 04/10/2014    Adiposity 04/10/2014   Obstructive apnea 04/10/2014    Past Medical History: Past Medical History:  Diagnosis Date   Anxiety    Apnea    Complication of anesthesia    slow to wake up afterwards   Diverticulitis    Environmental allergies    Hypertension    Menopausal state    Obesity    PMB (postmenopausal bleeding)    PVD (peripheral vascular disease) (HCC)    Rosacea    Simple endometrial hyperplasia    Sleep apnea    Thyroid disease    Urinary urgency     Past Surgical History: Past Surgical History:  Procedure Laterality Date   CHOLECYSTECTOMY     COLONOSCOPY WITH PROPOFOL N/A 05/05/2021   Procedure: COLONOSCOPY WITH PROPOFOL;  Surgeon: Toney Reil, MD;  Location: [facility] ENDOSCOPY;  Service: Gastroenterology;  Laterality: N/A;   DILATION AND CURETTAGE OF UTERUS     ESOPHAGOGASTRODUODENOSCOPY (EGD) WITH PROPOFOL N/A 02/03/2021   Procedure: ESOPHAGOGASTRODUODENOSCOPY (EGD) WITH PROPOFOL;  Surgeon: Toney Reil, MD;  Location: [facility] ENDOSCOPY;  Service: Gastroenterology;  Laterality: N/A;   HYSTEROSCOPY      OB History:  OB History  Gravida Para Term Preterm AB Living  3 3 3   3   SAB IAB Ectopic Multiple Live Births      3    # Outcome Date GA Lbr Len/2nd Weight Sex Type Anes PTL Lv  3 Term 1978   7 lb 1.6 oz (3.221 kg) M Vag-Spont   LIV  2 Term 1977   7 lb (3.175 kg) M Vag-Spont   LIV  1 Term 1975   7 lb 1.9 oz (3.23 kg) M Vag-Spont   LIV    Family History: Family History  Problem Relation Age of Onset   Diabetes Mother    Heart disease Mother    Diabetes Sister    Breast cancer Sister    Cancer Sister    Breast cancer Maternal Aunt 60   Lung cancer Maternal Aunt    Cancer Maternal Grandmother    Colon cancer Maternal Grandmother    Breast cancer Maternal Grandmother 52   Ovarian cancer Neg Hx     Social History: Social History   Socioeconomic History   Marital status: Divorced    Spouse name: Not on file   Number of children: Not on  file   Years of education: Not on file   Highest education level: Not on file  Occupational History   Not on file  Tobacco Use   Smoking status: Never    Passive exposure: Yes   Smokeless tobacco: Never  Vaping Use   Vaping status: Never Used  Substance and Sexual Activity   Alcohol use: No   Drug use: No   Sexual activity: Not Currently  Other Topics Concern   Not on file  Social History Narrative   Lives alone at home.    Social Drivers of Corporate investment banker Strain: Not on file  Food Insecurity: Not on file  Transportation Needs: Not on file  Physical Activity: Inactive (01/11/2018)   Exercise Vital Sign    Days of Exercise per Week: 0 days    Minutes of Exercise  per Session: 0 min  Stress: Not on file  Social Connections: Not on file  Intimate Partner Violence: Not on file    Allergies: Allergies  Allergen Reactions   Azithromycin Itching   Penicillins Itching    Current Medications: Current Outpatient Medications  Medication Sig Dispense Refill   aspirin EC 81 MG tablet Take 81 mg by mouth daily.     azelastine (ASTELIN) 0.1 % nasal spray Place 2 sprays into both nostrils 2 (two) times daily. For seasonal allergies 30 mL 3   bisoprolol-hydrochlorothiazide (ZIAC) 5-6.25 MG tablet Take 1 tablet by mouth once daily 90 tablet 1   calcium carbonate (OS-CAL) 600 MG TABS tablet Take 600 mg by mouth at bedtime.     celecoxib (CELEBREX) 200 MG capsule Take 1 capsule (200 mg total) by mouth 2 (two) times daily 180 capsule 1   cetirizine (ZYRTEC) 10 MG tablet Take 10 mg by mouth at bedtime.     docusate sodium (COLACE) 100 MG capsule Take 100 mg by mouth 2 (two) times daily.     fluticasone (FLONASE) 50 MCG/ACT nasal spray Place 2 sprays into both nostrils daily. 16 g 9   gabapentin (NEURONTIN) 300 MG capsule Take 1 capsule by mouth nightly 90 capsule 1   hydrochlorothiazide (HYDRODIURIL) 25 MG tablet TAKE 1 TABLET BY MOUTH ONCE DAILY 90 tablet 2   imiquimod  (ALDARA) 5 % cream Apply topically 3 (three) times a week. 12 each 0   isosorbide mononitrate (IMDUR) 30 MG 24 hr tablet TAKE 1 TABLET BY MOUTH DAILY 90 tablet 3   levothyroxine (SYNTHROID) 150 MCG tablet Take 1 tablet (150 mcg total) by mouth once daily. Take on an empty stomach with a glass of water at least 30-60 minutes before breakfast. 90 tablet 1   magnesium oxide (MAG-OX) 400 MG tablet Take 1 tablet by mouth 2 (two) times daily.     Multiple Vitamins-Minerals (PRESERVISION AREDS 2+MULTI VIT PO) Take 1 capsule by mouth in the morning and at bedtime.     pantoprazole (PROTONIX) 40 MG tablet Take 40 mg by mouth 2 (two) times daily.     tiZANidine (ZANAFLEX) 4 MG tablet Take by mouth.     topiramate (TOPAMAX) 50 MG tablet Take 1 tablet by mouth at bedtime.     vitamin B-12 (CYANOCOBALAMIN) 1000 MCG tablet Take 1,000 mcg by mouth daily.     ondansetron (ZOFRAN) 8 MG tablet Take 8 mg by mouth every 8 (eight) hours as needed. (Patient not taking: Reported on 02/28/2024)     Potassium 99 MG TABS Take 99 mg by mouth in the morning and at bedtime. (Patient not taking: Reported on 01/17/2024)     No current facility-administered medications for this visit.     Objective:  Physical Examination:  BP 126/74   Pulse 70   Temp 98.6 F (37 C)   Resp 20   Wt 274 lb 6.4 oz (124.5 kg)   SpO2 100%   BMI 48.61 kg/m    ECOG Performance Status: 0 - Asymptomatic   Pelvic: exam chaperoned by CMA.  Vulva: atrophic and mucosa intact.  Erythematous lesions right medial aspect is noted. The site of the biopsy has completely healead. The WE areas are more prominent.  Remainder of exam deferred   01/25/2024 exam Vagina: agglutinated midway. Digitally opened but unable to reach cervix.  Cervix: Unable to visualize Uterus: Not grossly enlarged but limited exam due to vaginal agglutination and habitus BME: limited exam due to vaginal  agglutination and habitus Rectal: not indicated    Lab Review:  No  labs on site today  Imaging Review: Per hpi   Assessment:  Rebekah Perkins is a 68 y.o. female diagnosed with large area of VIN3 and squamous cell cancer of the vulva 4/23.  PET/CT negative for metastatic disease. Completed vulvar radiation 7/23. Follow up PET was negative in October 2023. NED since. No definitive evidence of cancer on exam.    Symptomatic vulvar erythema white epithelial areas with biopsy confirmed VIN1  Vaginal dryness and agglutination  Abdominal mass- benign appearing  ASCUS HPV negative  Medical co-morbidities complicating care: Morbid obesity, Sleep apnea, HTN.  Plan:   Problem List Items Addressed This Visit       Genitourinary   Vulvar low-grade squamous intraepithelial lesion (LGSIL) - Primary   Relevant Medications   imiquimod (ALDARA) 5 % cream   Other Visit Diagnoses       Counseling and coordination of care           Recommendations for VIN1 discussed. Given that she is symptomatic I offered topical therapy with Aldara and surgical management with ablation. She declines surgery at this time and would like to proceed with Aldara therapy M, W, F for 16 weeks. Side effects discussed. Treatment duration and application instructions provided.   Follow up in a few weeks to check the site and ensure she is tolerating therapy.   Abdominal mass- benign appearing. Possible lipoma? Monitor. If enlarging or changing, notify clinic for reevaluation, possible ultrasound.   Continue vaginal dilator therapy daily every 10 minutes as directed on the package insert and vaginal estrogen therapy 2-3 times per week.    Repeat Pap smear in March 2026  Follow-up in a few weeks for treatment check and again in 18-20 weeks for repeat evaluation and biopsy.   The patient's diagnosis, an outline of the further diagnostic and laboratory studies which will be required, the recommendation, and alternatives were discussed.  All questions were answered to the patient's  satisfaction.  I personally had a face to face interaction and evaluated the patient. I have reviewed her history and available records and have performed the physical exam.    Alpheus Stiff Iola Manila, MD

## 2024-03-28 ENCOUNTER — Inpatient Hospital Stay: Attending: Obstetrics and Gynecology | Admitting: Nurse Practitioner

## 2024-03-28 ENCOUNTER — Encounter: Payer: Self-pay | Admitting: Nurse Practitioner

## 2024-03-28 VITALS — BP 137/69 | HR 66 | Temp 98.2°F | Resp 20 | Ht 63.0 in | Wt 270.2 lb

## 2024-03-28 DIAGNOSIS — Z08 Encounter for follow-up examination after completed treatment for malignant neoplasm: Secondary | ICD-10-CM | POA: Diagnosis present

## 2024-03-28 DIAGNOSIS — R8769 Abnormal cytological findings in specimens from other female genital organs: Secondary | ICD-10-CM | POA: Diagnosis not present

## 2024-03-28 DIAGNOSIS — Z8544 Personal history of malignant neoplasm of other female genital organs: Secondary | ICD-10-CM | POA: Insufficient documentation

## 2024-03-28 NOTE — Progress Notes (Signed)
 Gynecologic Oncology Interval Visit   Referring Provider: Dr Janit  Chief Concern: vulvar cancer, s/p radiation  Subjective:  Rebekah Perkins is a 68 y.o. female who is seen in consultation from Dr. Janit for VIN3 and invasive vulvar cancer, medically inoperable (cT1bN0), stage IB poorly differentiated invasive squamous cell carcinoma s/p radiation and boost with concurrent cisplatin, at [facility] with Dr. Lurline and Dr. Mancil, completed 05/27/22, with negative PET post completion, who returns to clinic for aldara  recheck.   She started aldara  three nights a week around May 1st. Complains of irritation. Having difficulty alternating estrogen in vagina and aldara  to vulva with vaseline.          Gyn Oncology History She presented to Dr Janit for her annual examination 02/04/22.  She states that she was doing well since last year but in February she had 3 to 4 days of vaginal bleeding almost like a period.  Then again early in March she had 1 day of bleeding.  She says she feels like something is tearing down there. She continues to use estrogen vaginal cream every other day.  Vulvar exam shows posterior fourchette with large apparently kissing lesion.  Very friable.  Erythematous.   Biopsy of vulva: At least intraepithelial squamous cell carcinoma (Carcinoma in-situ / VIN III). Invasive carcinoma cannot be excluded in the submitted biopsy due to superficial nature of the biopsy.   Seen by Gyn Oncology  02/16/22 VULVA, LEFT; BIOPSY: INVASIVE SQUAMOUS CELL CARCINOMA, P16 POSITIVE.  Sections demonstrate an invasive poorly differentiated malignant neoplasm that focally undermines intact squamous mucosa. A limited panel of immunohistochemical stains was performed. The malignant neoplasm is positive for pancytokeratin, p63, and p16 (diffuse, block-like). This pattern of immunoreactivity supports the above diagnosis.   PET/CT 03/18/22 1. Focal uptake at the left posterior peritoneum/multiple compatible  with known primary malignancy.  2. No evidence of metastatic disease.  3. Mildly enlarged thyroid  gland with diffusely increased uptake suggestive of Graves' disease or thyroiditis. Correlate clinically.   In view of diffuse involvement of vulva with VIN and invasive cancer decision made to treat with radiation.   Treated by Dr Lurline at [facility], IMRT 10X 5040cGy 48 180cGy 03/30/22-05/16/22  Vulva boost, IMRT 10X 1600cGy 10 200cGy 05/18/22-05/27/22 Total dose 6640cGy 59   08/31/22- PET  IMPRESSION:  1. Resolved hypermetabolic activity in the left vulva.  2.  No evidence of FDG avid metastatic disease.  3.  Findings suggestive of thyroiditis   Last pap 02/07/23- ASCUS HR HPV Negative. No history of abnormal pap previously.   She had a Biopsy on 01/17/2024 to assess vulvar irritation and area on exam notable for erythema and leukoplakia on the right vulva.    1. Vulva, biopsy, right, lesion :       - LOW-GRADE SQUAMOUS INTRAEPITHELIAL LESION (LSIL/VIN-1).  She had a biopsy check on 01/25/2024 and noted that biopsy site is healing well.    She was also started on vaginal dilator therapy daily x 10 minutes, but is having difficulty with that, and on 01/25/2024 she started vaginal estrogen therapy 2-3 nights a week.    Problem List: Patient Active Problem List   Diagnosis Date Noted   Vulvar low-grade squamous intraepithelial lesion (LGSIL) 01/25/2024   Encounter for follow-up surveillance of vulvar cancer 01/17/2024   Vulvar lesion 01/17/2024   Vaginal dryness 01/17/2024   Vulvar cancer, carcinoma (HCC) 02/15/2022   Screening for colon cancer    Chronic GERD    BMI 45.0-49.9, adult (  HCC) 12/03/2019   Endometrial hyperplasia without atypia, simple 07/21/2015   Diverticulosis 07/21/2015   Neuritis or radiculitis due to rupture of lumbar intervertebral disc 11/12/2014   Lumbar canal stenosis 11/12/2014   Bursitis, trochanteric 11/12/2014   Back ache 04/10/2014   Chronic anemia  04/10/2014   BP (high blood pressure) 04/10/2014   Blood glucose elevated 04/10/2014   HLD (hyperlipidemia) 04/10/2014   Adult hypothyroidism 04/10/2014   Adiposity 04/10/2014   Obstructive apnea 04/10/2014   Past Medical History: Past Medical History:  Diagnosis Date   Anxiety    Apnea    Complication of anesthesia    slow to wake up afterwards   Diverticulitis    Environmental allergies    Hypertension    Menopausal state    Obesity    PMB (postmenopausal bleeding)    PVD (peripheral vascular disease) (HCC)    Rosacea    Simple endometrial hyperplasia    Sleep apnea    Thyroid  disease    Urinary urgency    Past Surgical History: Past Surgical History:  Procedure Laterality Date   CHOLECYSTECTOMY     COLONOSCOPY WITH PROPOFOL  N/A 05/05/2021   Procedure: COLONOSCOPY WITH PROPOFOL ;  Surgeon: Unk Corinn Skiff, MD;  Location: [facility] ENDOSCOPY;  Service: Gastroenterology;  Laterality: N/A;   DILATION AND CURETTAGE OF UTERUS     ESOPHAGOGASTRODUODENOSCOPY (EGD) WITH PROPOFOL  N/A 02/03/2021   Procedure: ESOPHAGOGASTRODUODENOSCOPY (EGD) WITH PROPOFOL ;  Surgeon: Unk Corinn Skiff, MD;  Location: [facility] ENDOSCOPY;  Service: Gastroenterology;  Laterality: N/A;   HYSTEROSCOPY     OB History:  OB History  Gravida Para Term Preterm AB Living  3 3 3   3   SAB IAB Ectopic Multiple Live Births      3    # Outcome Date GA Lbr Len/2nd Weight Sex Type Anes PTL Lv  3 Term 1978   7 lb 1.6 oz (3.221 kg) M Vag-Spont   LIV  2 Term 1977   7 lb (3.175 kg) M Vag-Spont   LIV  1 Term 1975   7 lb 1.9 oz (3.23 kg) M Vag-Spont   LIV   Family History: Family History  Problem Relation Age of Onset   Diabetes Mother    Heart disease Mother    Diabetes Sister    Breast cancer Sister    Cancer Sister    Breast cancer Maternal Aunt 60   Lung cancer Maternal Aunt    Cancer Maternal Grandmother    Colon cancer Maternal Grandmother    Breast cancer Maternal Grandmother 1   Ovarian cancer Neg Hx     Social History: Social History   Socioeconomic History   Marital status: Divorced    Spouse name: Not on file   Number of children: Not on file   Years of education: Not on file   Highest education level: Not on file  Occupational History   Not on file  Tobacco Use   Smoking status: Never    Passive exposure: Yes   Smokeless tobacco: Never  Vaping Use   Vaping status: Never Used  Substance and Sexual Activity   Alcohol use: No   Drug use: No   Sexual activity: Not Currently  Other Topics Concern   Not on file  Social History Narrative   Lives alone at home.    Social Drivers of Corporate investment banker Strain: Not on file  Food Insecurity: Not on file  Transportation Needs: Not on file  Physical Activity: Inactive (01/11/2018)  Exercise Vital Sign    Days of Exercise per Week: 0 days    Minutes of Exercise per Session: 0 min  Stress: Not on file  Social Connections: Not on file  Intimate Partner Violence: Not on file   Allergies: Allergies  Allergen Reactions   Azithromycin Itching   Penicillins Itching   Current Medications: Current Outpatient Medications  Medication Sig Dispense Refill   doxycycline  (VIBRA -TABS) 100 MG tablet Take 100 mg by mouth.     predniSONE  (DELTASONE ) 20 MG tablet Take 20 mg by mouth.     aspirin  EC 81 MG tablet Take 81 mg by mouth daily.     azelastine  (ASTELIN ) 0.1 % nasal spray Place 2 sprays into both nostrils 2 (two) times daily. For seasonal allergies 30 mL 3   bisoprolol -hydrochlorothiazide  (ZIAC ) 5-6.25 MG tablet Take 1 tablet by mouth once daily 90 tablet 1   calcium  carbonate (OS-CAL) 600 MG TABS tablet Take 600 mg by mouth at bedtime.     celecoxib  (CELEBREX ) 200 MG capsule Take 1 capsule (200 mg total) by mouth 2 (two) times daily 180 capsule 1   cetirizine (ZYRTEC) 10 MG tablet Take 10 mg by mouth at bedtime.     docusate sodium  (COLACE) 100 MG capsule Take 100 mg by mouth 2 (two) times daily.     fluticasone   (FLONASE ) 50 MCG/ACT nasal spray Place 2 sprays into both nostrils daily. 16 g 9   gabapentin  (NEURONTIN ) 300 MG capsule Take 1 capsule by mouth nightly 90 capsule 1   hydrochlorothiazide  (HYDRODIURIL ) 25 MG tablet TAKE 1 TABLET BY MOUTH ONCE DAILY 90 tablet 2   imiquimod  (ALDARA ) 5 % cream Apply topically 3 (three) times a week. 12 each 0   isosorbide  mononitrate (IMDUR ) 30 MG 24 hr tablet TAKE 1 TABLET BY MOUTH DAILY 90 tablet 3   levothyroxine  (SYNTHROID ) 150 MCG tablet Take 1 tablet (150 mcg total) by mouth once daily. Take on an empty stomach with a glass of water at least 30-60 minutes before breakfast. 90 tablet 1   magnesium  oxide (MAG-OX) 400 MG tablet Take 1 tablet by mouth 2 (two) times daily.     Multiple Vitamins-Minerals (PRESERVISION AREDS 2+MULTI VIT PO) Take 1 capsule by mouth in the morning and at bedtime.     ondansetron  (ZOFRAN ) 8 MG tablet Take 8 mg by mouth every 8 (eight) hours as needed. (Patient not taking: Reported on 07/12/2023)     pantoprazole  (PROTONIX ) 40 MG tablet Take 40 mg by mouth 2 (two) times daily.     Potassium 99 MG TABS Take 99 mg by mouth in the morning and at bedtime. (Patient not taking: Reported on 01/17/2024)     tiZANidine  (ZANAFLEX ) 4 MG tablet Take by mouth.     topiramate  (TOPAMAX ) 50 MG tablet Take 1 tablet by mouth at bedtime.     vitamin B-12 (CYANOCOBALAMIN ) 1000 MCG tablet Take 1,000 mcg by mouth daily.     No current facility-administered medications for this visit.   Objective:  Physical Examination:  BP 137/69 (BP Location: Left Arm, Patient Position: Sitting, Cuff Size: Normal)   Pulse 66   Temp 98.2 F (36.8 C) (Tympanic)   Resp 20   Ht 5' 3 (1.6 m)   Wt 270 lb 3.7 oz (122.6 kg)   SpO2 98%   BMI 47.87 kg/m    ECOG Performance Status: 1 - Symptomatic but completely ambulatory  Pelvic: exam chaperoned by CMA.  Vulva: wide spread irritation. No frank ulceration  but fragile appearing tissue of vulva. Irritation extends to upper  thigh.   Remainder of exam deferred   01/25/2024 exam Vagina: agglutinated midway. Digitally opened but unable to reach cervix.  Cervix: Unable to visualize Uterus: Not grossly enlarged but limited exam due to vaginal agglutination and habitus BME: limited exam due to vaginal agglutination and habitus Rectal: not indicated    Lab Review:  No labs on site today  Imaging Review: Per hpi   Assessment:  Rebekah Perkins is a 68 y.o. female diagnosed with large area of VIN3 and squamous cell cancer of the vulva 4/23.  PET/CT negative for metastatic disease. Completed vulvar radiation 7/23. Follow up PET was negative in October 2023. NED since. No definitive evidence of cancer on exam.    Symptomatic vulvar erythema white epithelial areas with biopsy confirmed VIN1, symptomatic- started aldara  03/14/24  Vaginal dryness and agglutination - started vaginal estrogen 03/14/24  Abdominal mass- benign appearing  ASCUS HPV negative  Medical co-morbidities complicating care: Morbid obesity, Sleep apnea, HTN.  Plan:   Problem List Items Addressed This Visit   None  Previously discussed options for management including topical therapy with aldara  vs surgical management with ablation for her symptoms. She elected for topical Aldara . Tolerating poorly but I suspect this with related to application. Advised her to hold treatment for remainder of this week. Reviewed application recommendations including using a small volume of cream and using desitin to non-treatment surfaces. Advised use of mirror and provided her with long handled q-tips to improve application. I'll plan to see her back in 2-3 weeks to recheck treatment site.   Abdominal mass- benign appearing. Possible lipoma? Monitor. If enlarging or changing, notify clinic for reevaluation, possible ultrasound.   Continue vaginal dilator therapy daily every 10 minutes as directed on the package insert and vaginal estrogen therapy 2-3 times per  week. Advised to alternate tx dates to non-aldara  days.   Repeat Pap smear in March 2026  She will complete 16 weeks of treatment then plan to return to gyn onc after 4-6 week rest period for reevaluation.   The patient's diagnosis, an outline of the further diagnostic and laboratory studies which will be required, the recommendation, and alternatives were discussed.  All questions were answered to the patient's satisfaction.  Tinnie Dawn, DNP, AGNP-C, AOCNP [facility] [phone] (clinic)

## 2024-04-11 ENCOUNTER — Inpatient Hospital Stay (HOSPITAL_BASED_OUTPATIENT_CLINIC_OR_DEPARTMENT_OTHER): Admitting: Nurse Practitioner

## 2024-04-11 ENCOUNTER — Encounter: Payer: Self-pay | Admitting: Nurse Practitioner

## 2024-04-11 VITALS — BP 132/86 | HR 58 | Temp 97.9°F | Resp 18 | Wt 269.0 lb

## 2024-04-11 DIAGNOSIS — R8769 Abnormal cytological findings in specimens from other female genital organs: Secondary | ICD-10-CM | POA: Diagnosis not present

## 2024-04-11 DIAGNOSIS — Z08 Encounter for follow-up examination after completed treatment for malignant neoplasm: Secondary | ICD-10-CM | POA: Diagnosis not present

## 2024-04-11 MED ORDER — IMIQUIMOD 5 % EX CREA
TOPICAL_CREAM | CUTANEOUS | 0 refills | Status: AC
Start: 2024-04-12 — End: ?

## 2024-04-11 NOTE — Addendum Note (Signed)
 Addended by: Beverely Suen G on: 04/11/2024 03:19 PM   Modules accepted: Orders

## 2024-04-11 NOTE — Progress Notes (Signed)
 Patient says that the Aldara  cream she used it las tight but it only made it worse, burning and itching just really painful. Pain level today between her vulva pain lower back is about 5 or 6 today. You feel like the Aldara  may not a good fit, you needs refills on them if Doctor wants her to continue taking them.

## 2024-04-11 NOTE — Progress Notes (Signed)
 Gynecologic Oncology Interval Visit   Referring Provider: Dr Luster Salters  Chief Concern: vulvar cancer, s/p radiation  Subjective:  Rebekah Perkins is a 68 y.o. female who is seen in consultation from Dr. Luster Salters for VIN3 and invasive vulvar cancer, medically inoperable (cT1bN0), stage IB poorly differentiated invasive squamous cell carcinoma s/p radiation and boost with concurrent cisplatin, at [facility] with Dr. Americo Baker and Dr. Marella Shams, completed 05/27/22, with negative PET post completion, found to have vulvar erythema white epithelial areas with biopsy confirmed VIN1. Surgery vs topical aldara  were discussed and she elected for aldara . Started Aldara  M-W-F around May 1st. She returns to clinic for recheck.   She was seen 2 weeks ago. Had widespread irritation at that time. She is using a long cotton swab for application which helps. Using vaseline to non-treatment surfaces. She has been using aldara  for approximately 3.5 weeks. She's having worsening redness and pain. She had to wash cream off last night d/t symptoms.    Gyn Oncology History She presented to Dr Luster Salters for her annual examination 02/04/22.  She states that she was doing well since last year but in February she had 3 to 4 days of vaginal bleeding "almost like a period".  Then again early in March she had 1 day of bleeding.  She says she feels like "something is tearing down there". She continues to use estrogen vaginal cream every other day.  Vulvar exam shows posterior fourchette with large apparently kissing lesion.  Very friable.  Erythematous.   Biopsy of vulva: At least intraepithelial squamous cell carcinoma (Carcinoma in-situ / VIN III). Invasive carcinoma cannot be excluded in the submitted biopsy due to superficial nature of the biopsy.   Seen by Gyn Oncology  02/16/22 VULVA, LEFT; BIOPSY: INVASIVE SQUAMOUS CELL CARCINOMA, P16 POSITIVE.  Sections demonstrate an invasive poorly differentiated malignant neoplasm that focally undermines  intact squamous mucosa. A limited panel of immunohistochemical stains was performed. The malignant neoplasm is positive for pancytokeratin, p63, and p16 (diffuse, block-like). This pattern of immunoreactivity supports the above diagnosis.   PET/CT 03/18/22 1. Focal uptake at the left posterior peritoneum/multiple compatible with known primary malignancy.  2. No evidence of metastatic disease.  3. Mildly enlarged thyroid  gland with diffusely increased uptake suggestive of Graves' disease or thyroiditis. Correlate clinically.   In view of diffuse involvement of vulva with VIN and invasive cancer decision made to treat with radiation.   Treated by Dr Americo Baker at [facility], IMRT 10X 5040cGy 48 180cGy 03/30/22-05/16/22  Vulva boost, IMRT 10X 1600cGy 10 200cGy 05/18/22-05/27/22 Total dose 6640cGy 59   08/31/22- PET  IMPRESSION:  1. Resolved hypermetabolic activity in the left vulva.  2.  No evidence of FDG avid metastatic disease.  3.  Findings suggestive of thyroiditis   Last pap 02/07/23- ASCUS HR HPV Negative. No history of abnormal pap previously.   She had a Biopsy on 01/17/2024 to assess vulvar irritation and area on exam notable for erythema and leukoplakia on the right vulva.    1. Vulva, biopsy, right, lesion :       - LOW-GRADE SQUAMOUS INTRAEPITHELIAL LESION (LSIL/VIN-1).  She had a biopsy check on 01/25/2024 and noted that biopsy site is healing well.    She was also started on vaginal dilator therapy daily x 10 minutes, but is having difficulty with that, and on 01/25/2024 she started vaginal estrogen therapy 2-3 nights a week.   Problem List: Patient Active Problem List   Diagnosis Date Noted   Vulvar low-grade  squamous intraepithelial lesion (LGSIL) 01/25/2024   Encounter for follow-up surveillance of vulvar cancer 01/17/2024   Vulvar lesion 01/17/2024   Vaginal dryness 01/17/2024   Vulvar cancer, carcinoma (HCC) 02/15/2022   Screening for colon cancer    Chronic GERD    BMI  45.0-49.9, adult (HCC) 12/03/2019   Endometrial hyperplasia without atypia, simple 07/21/2015   Diverticulosis 07/21/2015   Neuritis or radiculitis due to rupture of lumbar intervertebral disc 11/12/2014   Lumbar canal stenosis 11/12/2014   Bursitis, trochanteric 11/12/2014   Back ache 04/10/2014   Chronic anemia 04/10/2014   BP (high blood pressure) 04/10/2014   Blood glucose elevated 04/10/2014   HLD (hyperlipidemia) 04/10/2014   Adult hypothyroidism 04/10/2014   Adiposity 04/10/2014   Obstructive apnea 04/10/2014    Past Medical History: Past Medical History:  Diagnosis Date   Anxiety    Apnea    Complication of anesthesia    slow to wake up afterwards   Diverticulitis    Environmental allergies    Hypertension    Menopausal state    Obesity    PMB (postmenopausal bleeding)    PVD (peripheral vascular disease) (HCC)    Rosacea    Simple endometrial hyperplasia    Sleep apnea    Thyroid  disease    Urinary urgency     Past Surgical History: Past Surgical History:  Procedure Laterality Date   CHOLECYSTECTOMY     COLONOSCOPY WITH PROPOFOL  N/A 05/05/2021   Procedure: COLONOSCOPY WITH PROPOFOL ;  Surgeon: Selena Daily, MD;  Location: [facility] ENDOSCOPY;  Service: Gastroenterology;  Laterality: N/A;   DILATION AND CURETTAGE OF UTERUS     ESOPHAGOGASTRODUODENOSCOPY (EGD) WITH PROPOFOL  N/A 02/03/2021   Procedure: ESOPHAGOGASTRODUODENOSCOPY (EGD) WITH PROPOFOL ;  Surgeon: Selena Daily, MD;  Location: [facility] ENDOSCOPY;  Service: Gastroenterology;  Laterality: N/A;   HYSTEROSCOPY      OB History:  OB History  Gravida Para Term Preterm AB Living  3 3 3   3   SAB IAB Ectopic Multiple Live Births      3    # Outcome Date GA Lbr Len/2nd Weight Sex Type Anes PTL Lv  3 Term 1978   7 lb 1.6 oz (3.221 kg) M Vag-Spont   LIV  2 Term 1977   7 lb (3.175 kg) M Vag-Spont   LIV  1 Term 1975   7 lb 1.9 oz (3.23 kg) M Vag-Spont   LIV    Family History: Family History  Problem  Relation Age of Onset   Diabetes Mother    Heart disease Mother    Diabetes Sister    Breast cancer Sister    Cancer Sister    Breast cancer Maternal Aunt 60   Lung cancer Maternal Aunt    Cancer Maternal Grandmother    Colon cancer Maternal Grandmother    Breast cancer Maternal Grandmother 52   Ovarian cancer Neg Hx     Social History: Social History   Socioeconomic History   Marital status: Divorced    Spouse name: Not on file   Number of children: Not on file   Years of education: Not on file   Highest education level: Not on file  Occupational History   Not on file  Tobacco Use   Smoking status: Never    Passive exposure: Yes   Smokeless tobacco: Never  Vaping Use   Vaping status: Never Used  Substance and Sexual Activity   Alcohol use: No   Drug use: No   Sexual activity:  Not Currently  Other Topics Concern   Not on file  Social History Narrative   Lives alone at home.    Social Drivers of Corporate investment banker Strain: Not on file  Food Insecurity: Not on file  Transportation Needs: Not on file  Physical Activity: Inactive (01/11/2018)   Exercise Vital Sign    Days of Exercise per Week: 0 days    Minutes of Exercise per Session: 0 min  Stress: Not on file  Social Connections: Not on file  Intimate Partner Violence: Not on file    Allergies: Allergies  Allergen Reactions   Azithromycin Itching   Penicillins Itching    Current Medications: Current Outpatient Medications  Medication Sig Dispense Refill   aspirin EC 81 MG tablet Take 81 mg by mouth daily.     azelastine  (ASTELIN ) 0.1 % nasal spray Place 2 sprays into both nostrils 2 (two) times daily. For seasonal allergies 30 mL 3   bisoprolol -hydrochlorothiazide  (ZIAC ) 5-6.25 MG tablet Take 1 tablet by mouth once daily 90 tablet 1   calcium  carbonate (OS-CAL) 600 MG TABS tablet Take 600 mg by mouth at bedtime.     celecoxib  (CELEBREX ) 200 MG capsule Take 1 capsule (200 mg total) by mouth 2  (two) times daily 180 capsule 1   cetirizine (ZYRTEC) 10 MG tablet Take 10 mg by mouth at bedtime.     docusate sodium (COLACE) 100 MG capsule Take 100 mg by mouth 2 (two) times daily.     fluticasone  (FLONASE ) 50 MCG/ACT nasal spray Place 2 sprays into both nostrils daily. 16 g 9   gabapentin  (NEURONTIN ) 300 MG capsule Take 1 capsule by mouth nightly 90 capsule 1   hydrochlorothiazide  (HYDRODIURIL ) 25 MG tablet TAKE 1 TABLET BY MOUTH ONCE DAILY 90 tablet 2   imiquimod  (ALDARA ) 5 % cream Apply topically 3 (three) times a week. 12 each 0   levothyroxine  (SYNTHROID ) 150 MCG tablet Take 1 tablet (150 mcg total) by mouth once daily. Take on an empty stomach with a glass of water at least 30-60 minutes before breakfast. 90 tablet 1   magnesium  oxide (MAG-OX) 400 MG tablet Take 1 tablet by mouth 2 (two) times daily.     Multiple Vitamins-Minerals (PRESERVISION AREDS 2+MULTI VIT PO) Take 1 capsule by mouth in the morning and at bedtime.     pantoprazole  (PROTONIX ) 40 MG tablet Take 40 mg by mouth 2 (two) times daily.     tiZANidine  (ZANAFLEX ) 4 MG tablet Take by mouth.     topiramate  (TOPAMAX ) 50 MG tablet Take 1 tablet by mouth at bedtime.     vitamin B-12 (CYANOCOBALAMIN) 1000 MCG tablet Take 1,000 mcg by mouth daily.     isosorbide  mononitrate (IMDUR ) 30 MG 24 hr tablet TAKE 1 TABLET BY MOUTH DAILY (Patient not taking: Reported on 04/11/2024) 90 tablet 3   ondansetron  (ZOFRAN ) 8 MG tablet Take 8 mg by mouth every 8 (eight) hours as needed. (Patient not taking: Reported on 04/11/2024)     Potassium 99 MG TABS Take 99 mg by mouth in the morning and at bedtime. (Patient not taking: Reported on 01/17/2024)     No current facility-administered medications for this visit.   Objective:  Physical Examination:  BP 132/86 (BP Location: Left Arm, Patient Position: Sitting, Cuff Size: Normal)   Pulse (!) 58   Temp 97.9 F (36.6 C) (Tympanic)   Resp 18   Wt 269 lb (122 kg)   SpO2 99%   BMI 47.65  kg/m     ECOG Performance Status: 0 - Asymptomatic  GENERAL: Patient is a well appearing female in no acute distress NEURO:  Nonfocal. Well oriented.  Appropriate affect.  Pelvic: exam chaperoned by CMA.  Vulva: erythema of labia majora has improved. Erythematous lesion of right labia minora at treatment site has become more erythematous and areas of ulceration. Left labia minor extending into vaginal introitus is erythematous and scattered ulcerations. Tender. Remainder of exam deferred. We used a mirror to team area of treatment and areas for vaseline.    Lab Review:  No labs on site today  Imaging Review: No imaging on site today   Assessment:  SHENAY TORTI is a 68 y.o. female diagnosed with large area of VIN3 and squamous cell cancer of the vulva 4/23.  PET/CT negative for metastatic disease. Completed vulvar radiation 7/23. Follow up PET was negative in October 2023. NED since. No definitive evidence of cancer on exam.    Symptomatic vulvar erythema white epithelial areas with biopsy confirmed VIN1. Stated Aldara  3 times a week around May 1st. Complicated by skin reaction.   Vaginal dryness and agglutination- on intravaginal estradiol .   Abdominal mass- benign appearing  ASCUS HPV negative  Medical co-morbidities complicating care: Morbid obesity, Sleep apnea, HTN.  Plan:   Problem List Items Addressed This Visit   None    She's now completed 3.5 weeks of treatment. Given local skin reaction, recommend holding x 1 week. Reviewed strategies for application and use of vaseline to non treatment areas. Use a smaller volume of cream (currently using 1 packet- estimate needing pea sized volume. Plan to resume treatment once skin reaction improves then 3 times a week. Will see her back in 3 weeks for recheck. Plan to recheck area in 18-20 weeks for reevaluation & biopsy.   Abdominal mass- benign appearing. Possible lipoma? Monitor. If enlarging or changing, notify clinic for  reevaluation, possible ultrasound.   Continue vaginal dilator therapy daily every 10 minutes as directed on the package insert and vaginal estrogen therapy 2-3 times per week.   Repeat Pap smear in March 2026  Kenney Peacemaker, DNP, AGNP-C, [facility] at [facility] [phone] (clinic)

## 2024-04-20 ENCOUNTER — Other Ambulatory Visit: Payer: Self-pay | Admitting: Nurse Practitioner

## 2024-04-29 ENCOUNTER — Emergency Department

## 2024-04-29 ENCOUNTER — Other Ambulatory Visit: Payer: Self-pay

## 2024-04-29 ENCOUNTER — Observation Stay
Admission: EM | Admit: 2024-04-29 | Discharge: 2024-04-30 | Disposition: A | Discharge status: Home / Self Care | Attending: Internal Medicine | Admitting: Internal Medicine

## 2024-04-29 DIAGNOSIS — E039 Hypothyroidism, unspecified: Secondary | ICD-10-CM | POA: Insufficient documentation

## 2024-04-29 DIAGNOSIS — Z7982 Long term (current) use of aspirin: Secondary | ICD-10-CM | POA: Insufficient documentation

## 2024-04-29 DIAGNOSIS — Z79899 Other long term (current) drug therapy: Secondary | ICD-10-CM | POA: Diagnosis not present

## 2024-04-29 DIAGNOSIS — R112 Nausea with vomiting, unspecified: Secondary | ICD-10-CM | POA: Insufficient documentation

## 2024-04-29 DIAGNOSIS — I700 Atherosclerosis of aorta: Secondary | ICD-10-CM | POA: Insufficient documentation

## 2024-04-29 DIAGNOSIS — R42 Dizziness and giddiness: Secondary | ICD-10-CM | POA: Diagnosis not present

## 2024-04-29 DIAGNOSIS — Z9889 Other specified postprocedural states: Secondary | ICD-10-CM | POA: Diagnosis not present

## 2024-04-29 DIAGNOSIS — I639 Cerebral infarction, unspecified: Secondary | ICD-10-CM | POA: Diagnosis present

## 2024-04-29 DIAGNOSIS — I10 Essential (primary) hypertension: Secondary | ICD-10-CM | POA: Diagnosis not present

## 2024-04-29 DIAGNOSIS — R4789 Other speech disturbances: Secondary | ICD-10-CM | POA: Diagnosis present

## 2024-04-29 LAB — CBG MONITORING, ED: Glucose-Capillary: 128 mg/dL — ABNORMAL HIGH (ref 70–99)

## 2024-04-29 MED ORDER — ONDANSETRON HCL 4 MG/2ML IJ SOLN
4.0000 mg | Freq: Once | INTRAMUSCULAR | Status: AC
Start: 2024-04-29 — End: 2024-04-30
  Administered 2024-04-30: 4 mg via INTRAVENOUS
  Filled 2024-04-29: qty 2

## 2024-04-29 MED ORDER — SODIUM CHLORIDE 0.9% FLUSH
3.0000 mL | Freq: Once | INTRAVENOUS | Status: AC
  Administered 2024-04-29: 3 mL via INTRAVENOUS

## 2024-04-29 MED ORDER — ASPIRIN 81 MG PO CHEW
324.0000 mg | CHEWABLE_TABLET | Freq: Once | ORAL | Status: AC
  Administered 2024-04-30: 324 mg via ORAL
  Filled 2024-04-29: qty 4

## 2024-04-29 MED ORDER — IOHEXOL 350 MG/ML SOLN
100.0000 mL | Freq: Once | INTRAVENOUS | Status: AC | PRN
  Administered 2024-04-29: 100 mL via INTRAVENOUS

## 2024-04-29 NOTE — ED Triage Notes (Addendum)
 Pt to ED via [facility] c/o sudden onset of dizziness around 9pm tonight. Pt reports she was laying down when she started feeling dizzy and nauseous. Denies numbness, tingling, slurred speech. Per Hendrick Locke MD, call code stroke

## 2024-04-29 NOTE — ED Notes (Signed)
 Code [place] called to [facility] spoke with Tammie

## 2024-04-29 NOTE — ED Triage Notes (Signed)
 EMS brings pt in from home for c/o dizziness accomp by N/V; hx vertigo

## 2024-04-29 NOTE — ED Provider Notes (Signed)
 [facility] Provider Note    Event Date/Time   First MD Initiated Contact with Patient 04/29/24 2310     (approximate)   History   Dizziness   HPI  Rebekah Perkins is a 68 y.o. female brought to the ED via EMS from home with a chief complaint of sudden onset of dizziness around 9 PM associated with nausea and vomiting.  Denies slurred speech, disorientation, extremity weakness/numbness/tingling.  Denies recent fever/chills, chest pain, shortness of breath.  Code stroke was initiated upon patient's arrival to the emergency department.     Past Medical History   Past Medical History:  Diagnosis Date   Anxiety    Apnea    Complication of anesthesia    slow to wake up afterwards   Diverticulitis    Environmental allergies    Hypertension    Menopausal state    Obesity    PMB (postmenopausal bleeding)    PVD (peripheral vascular disease) (HCC)    Rosacea    Simple endometrial hyperplasia    Sleep apnea    Thyroid  disease    Urinary urgency      Active Problem List   Patient Active Problem List   Diagnosis Date Noted   Dizziness 04/29/2024   Vulvar low-grade squamous intraepithelial lesion (LGSIL) 01/25/2024   Encounter for follow-up surveillance of vulvar cancer 01/17/2024   Vulvar lesion 01/17/2024   Vaginal dryness 01/17/2024   Vulvar cancer, carcinoma (HCC) 02/15/2022   Screening for colon cancer    Chronic GERD    BMI 45.0-49.9, adult (HCC) 12/03/2019   Endometrial hyperplasia without atypia, simple 07/21/2015   Diverticulosis 07/21/2015   Neuritis or radiculitis due to rupture of lumbar intervertebral disc 11/12/2014   Lumbar canal stenosis 11/12/2014   Bursitis, trochanteric 11/12/2014   Back ache 04/10/2014   Chronic anemia 04/10/2014   BP (high blood pressure) 04/10/2014   Blood glucose elevated 04/10/2014   HLD (hyperlipidemia) 04/10/2014   Adult hypothyroidism 04/10/2014   Adiposity 04/10/2014   Obstructive apnea  04/10/2014     Past Surgical History   Past Surgical History:  Procedure Laterality Date   CHOLECYSTECTOMY     COLONOSCOPY WITH PROPOFOL  N/A 05/05/2021   Procedure: COLONOSCOPY WITH PROPOFOL ;  Surgeon: Selena Daily, MD;  Location: [facility] ENDOSCOPY;  Service: Gastroenterology;  Laterality: N/A;   DILATION AND CURETTAGE OF UTERUS     ESOPHAGOGASTRODUODENOSCOPY (EGD) WITH PROPOFOL  N/A 02/03/2021   Procedure: ESOPHAGOGASTRODUODENOSCOPY (EGD) WITH PROPOFOL ;  Surgeon: Selena Daily, MD;  Location: [facility] ENDOSCOPY;  Service: Gastroenterology;  Laterality: N/A;   HYSTEROSCOPY       Home Medications   Prior to Admission medications   Medication Sig Start Date End Date Taking? Authorizing Provider  aspirin EC 81 MG tablet Take 81 mg by mouth daily.    [provider]  azelastine  (ASTELIN ) 0.1 % nasal spray Place 2 sprays into both nostrils 2 (two) times daily. For seasonal allergies 01/25/24   Nelda Balsam, NP  bisoprolol -hydrochlorothiazide  (ZIAC ) 5-6.25 MG tablet Take 1 tablet by mouth once daily 06/15/21     calcium  carbonate (OS-CAL) 600 MG TABS tablet Take 600 mg by mouth at bedtime.    [provider]  celecoxib  (CELEBREX ) 200 MG capsule Take 1 capsule (200 mg total) by mouth 2 (two) times daily 05/31/21     cetirizine (ZYRTEC) 10 MG tablet Take 10 mg by mouth at bedtime.    [provider]  docusate sodium (COLACE) 100 MG capsule  Take 100 mg by mouth 2 (two) times daily.    [provider]  estradiol  (ESTRACE ) 0.1 MG/GM vaginal cream Place 0.25 Applicatorfuls vaginally 3 (three) times a week. 04/22/24   Nelda Balsam, NP  fluticasone  (FLONASE ) 50 MCG/ACT nasal spray Place 2 sprays into both nostrils daily. 06/23/21     gabapentin  (NEURONTIN ) 300 MG capsule Take 1 capsule by mouth nightly 06/15/21     hydrochlorothiazide  (HYDRODIURIL ) 25 MG tablet TAKE 1 TABLET BY MOUTH ONCE DAILY 03/31/21 04/11/24    imiquimod  (ALDARA ) 5 % cream Apply pea sized  amount to right vulvar lesion on Monday-Wednesday-Friday at bedtime. Wash off in morning. Apply vaseline to non-treatment skin. 04/12/24   Nelda Balsam, NP  isosorbide  mononitrate (IMDUR ) 30 MG 24 hr tablet TAKE 1 TABLET BY MOUTH DAILY Patient not taking: Reported on 04/11/2024 08/26/21 04/11/24    levothyroxine  (SYNTHROID ) 150 MCG tablet Take 1 tablet (150 mcg total) by mouth once daily. Take on an empty stomach with a glass of water at least 30-60 minutes before breakfast. 06/23/21     magnesium  oxide (MAG-OX) 400 MG tablet Take 1 tablet by mouth 2 (two) times daily. 06/26/23   [provider]  Multiple Vitamins-Minerals (PRESERVISION AREDS 2+MULTI VIT PO) Take 1 capsule by mouth in the morning and at bedtime.    [provider]  ondansetron  (ZOFRAN ) 8 MG tablet Take 8 mg by mouth every 8 (eight) hours as needed. Patient not taking: Reported on 04/11/2024 07/01/22   [provider]  pantoprazole  (PROTONIX ) 40 MG tablet Take 40 mg by mouth 2 (two) times daily.    [provider]  Potassium 99 MG TABS Take 99 mg by mouth in the morning and at bedtime. Patient not taking: Reported on 01/17/2024    [provider]  tiZANidine  (ZANAFLEX ) 4 MG tablet Take by mouth. 03/29/23   [provider]  topiramate  (TOPAMAX ) 50 MG tablet Take 1 tablet by mouth at bedtime. 06/29/23 06/28/24  [provider]  vitamin B-12 (CYANOCOBALAMIN) 1000 MCG tablet Take 1,000 mcg by mouth daily.    [provider]  phentermine  (ADIPEX-P ) 37.5 MG tablet Take 1 tablet (37.5 mg total) by mouth once daily Patient not taking: Reported on 04/11/2024 03/05/21 07/09/21       Allergies  Azithromycin and Penicillins   Family History   Family History  Problem Relation Age of Onset   Diabetes Mother    Heart disease Mother    Diabetes Sister    Breast cancer Sister    Cancer Sister    Breast cancer Maternal Aunt 37   Lung cancer Maternal Aunt    Cancer Maternal  Grandmother    Colon cancer Maternal Grandmother    Breast cancer Maternal Grandmother 7   Ovarian cancer Neg Hx      Physical Exam  Triage Vital Signs: ED Triage Vitals  Encounter Vitals Group     BP 04/29/24 2238 (!) 155/71     Girls Systolic BP Percentile --      Girls Diastolic BP Percentile --      Boys Systolic BP Percentile --      Boys Diastolic BP Percentile --      Pulse Rate 04/29/24 2238 62     Resp 04/29/24 2238 20     Temp 04/29/24 2238 98 F (36.7 C)     Temp Source 04/29/24 2238 Oral     SpO2 04/29/24 2226 98 %     Weight 04/29/24 2238  270 lb (122.5 kg)     Height 04/29/24 2238 5' 3 (1.6 m)     Head Circumference --      Peak Flow --      Pain Score 04/29/24 2243 0     Pain Loc --      Pain Education --      Exclude from Growth Chart --     Updated Vital Signs: BP (!) 155/71 (BP Location: Left Arm)   Pulse 62   Temp 98 F (36.7 C) (Oral)   Resp 20   Ht 5' 3 (1.6 m)   Wt 122.5 kg   SpO2 98%   BMI 47.83 kg/m    General: Awake, mild distress.  CV:  RRR.  Good peripheral perfusion.  Resp:  Normal effort.  CTAB. Abd:  Nontender.  No distention.  Other:  Alert and oriented x 3.  CN II-XII grossly intact except for nystagmus noted on examination.  5/5 motor strength and sensation all extremities.  MAE x 4.   ED Results / Procedures / Treatments  Labs (all labs ordered are listed, but only abnormal results are displayed) Labs Reviewed  CBG MONITORING, ED - Abnormal; Notable for the following components:      Result Value   Glucose-Capillary 128 (*)    All other components within normal limits  URINALYSIS, ROUTINE W REFLEX MICROSCOPIC  PROTIME-INR  APTT  CBC  DIFFERENTIAL  COMPREHENSIVE METABOLIC PANEL WITH GFR  ETHANOL  I-STAT CREATININE, ED  TROPONIN I (HIGH SENSITIVITY)     EKG  ED ECG REPORT I, Micheline Markes J, the attending physician, personally viewed and interpreted this ECG.   Date: 04/29/2024  EKG Time: 2330  Rate: 63   Rhythm: normal sinus rhythm  Axis: Normal  Intervals:none  ST&T Change: Nonspecific    RADIOLOGY I have independently visualized and interpreted patient's imaging studies as well as noted the radiology interpretation:  CT head code stroke: No ICH  CTA head/neck: No LVO  Official radiology report(s): CT ANGIO HEAD NECK W WO CM W PERF (CODE STROKE) Result Date: 04/29/2024 CLINICAL DATA:  Neuro deficit, acute, stroke suspected EXAM: CT ANGIOGRAPHY HEAD AND NECK CT PERFUSION BRAIN TECHNIQUE: Multidetector CT imaging of the head and neck was performed using the standard protocol during bolus administration of intravenous contrast. Multiplanar CT image reconstructions and MIPs were obtained to evaluate the vascular anatomy. Carotid stenosis measurements (when applicable) are obtained utilizing NASCET criteria, using the distal internal carotid diameter as the denominator. Multiphase CT imaging of the brain was performed following IV bolus contrast injection. Subsequent parametric perfusion maps were calculated using RAPID software. RADIATION DOSE REDUCTION: This exam was performed according to the departmental dose-optimization program which includes automated exposure control, adjustment of the mA and/or kV according to patient size and/or use of iterative reconstruction technique. CONTRAST:  OMNIPAQUE  IOHEXOL  350 MG/ML SOLN COMPARISON:  None Available. FINDINGS: CTA NECK FINDINGS Aortic arch: Great vessel origins are patent without significant stenosis. Aortic atherosclerosis. Right carotid system: No evidence of dissection, stenosis (50% or greater) or occlusion. Left carotid system: No evidence of dissection, stenosis (50% or greater) or occlusion. Vertebral arteries: Right-dominant. No evidence of dissection, stenosis (50% or greater) or occlusion. Skeleton: No acute abnormality on limited assessment. Multilevel degenerative change in the cervical spine. Other neck: No acute abnormality on  limited assessment. Upper chest: Visualized lung apices are clear. Review of the MIP images confirms the above findings CTA HEAD FINDINGS Anterior circulation: Bilateral intracranial ICAs, MCAs, and  ACAs are patent without proximal hemodynamically significant stenosis. Posterior circulation: Bilateral intradural vertebral arteries, basilar artery and bilateral posterior rods are patent without proximal and amic least significant stenosis. Small/non dominant left intradural vertebral artery and right fetal type PCA, anatomic variants. Venous sinuses: As permitted by contrast timing, patent. Anatomic variants: See above. Review of the MIP images confirms the above findings CT Brain Perfusion Findings: ASPECTS: 10. CBF (<30%) Volume: 0mL Perfusion (Tmax>6.0s) volume: 0mL Mismatch Volume: 0mL Infarction Location:None identified. IMPRESSION: 1. No emergent large vessel occlusion or proximal hemodynamically significant stenosis. 2. No evidence of core infarct or penumbra on CT perfusion. 3. Aortic atherosclerosis (ICD10-I70.0). Electronically Signed   By: Stevenson Elbe M.D.   On: 04/29/2024 23:36   CT HEAD CODE STROKE WO CONTRAST Result Date: 04/29/2024 CLINICAL DATA:  Code stroke.  Neuro deficit, acute, stroke suspected EXAM: CT HEAD WITHOUT CONTRAST TECHNIQUE: Contiguous axial images were obtained from the base of the skull through the vertex without intravenous contrast. RADIATION DOSE REDUCTION: This exam was performed according to the departmental dose-optimization program which includes automated exposure control, adjustment of the mA and/or kV according to patient size and/or use of iterative reconstruction technique. COMPARISON:  CT head April 30, 2021. FINDINGS: Brain: No evidence of acute large vascular territory infarction, hemorrhage, hydrocephalus, extra-axial collection or mass lesion/mass effect. Vascular: No hyperdense vessel. Skull: No acute fracture. Sinuses/Orbits: Clear sinuses.  No acute orbital  findings. Other: No mastoid effusions. ASPECTS [facility] Stroke Program Early CT Score) Total score (0-10 with 10 being normal): 10. IMPRESSION: 1. No evidence of acute intracranial abnormality. 2. ASPECTS is 10 Code stroke imaging results were communicated on 04/29/2024 at 11:07 pm to provider Dr. Hendrick Locke Via telephone. Electronically Signed   By: Stevenson Elbe M.D.   On: 04/29/2024 23:08     PROCEDURES:  NIH Stroke Scale  Interval: Baseline Time: 11:57 PM Person Administering Scale: Cay Kath J  Administer stroke scale items in the order listed. Record performance in each category after each subscale exam. Do not go back and change scores. Follow directions provided for each exam technique. Scores should reflect what the patient does, not what the clinician thinks the patient can do. The clinician should record answers while administering the exam and work quickly. Except where indicated, the patient should not be coached (i.e., repeated requests to patient to make a special effort).   1a  Level of consciousness: 0=alert; keenly responsive  1b. LOC questions:  0=Performs both tasks correctly  1c. LOC commands: 0=Performs both tasks correctly  2.  Best Gaze: 0=normal  3.  Visual: 0=No visual loss  4. Facial Palsy: 0=Normal symmetric movement  5a.  Motor left arm: 0=No drift, limb holds 90 (or 45) degrees for full 10 seconds  5b.  Motor right arm: 0=No drift, limb holds 90 (or 45) degrees for full 10 seconds  6a. motor left leg: 0=No drift, limb holds 90 (or 45) degrees for full 10 seconds  6b  Motor right leg:  0=No drift, limb holds 90 (or 45) degrees for full 10 seconds  7. Limb Ataxia: 1=Present in one limb  8.  Sensory: 0=Normal; no sensory loss  9. Best Language:  0=No aphasia, normal  10. Dysarthria: 0=Normal  11. Extinction and Inattention: 0=No abnormality  12. Distal motor function: 0=Normal   Total:   1     Critical Care performed: Yes, see critical care procedure  note(s)  CRITICAL CARE Performed by: Norlene Beavers   Total critical care time: 30  minutes  Critical care time was exclusive of separately billable procedures and treating other patients.  Critical care was necessary to treat or prevent imminent or life-threatening deterioration.  Critical care was time spent personally by me on the following activities: development of treatment plan with patient and/or surrogate as well as nursing, discussions with consultants, evaluation of patient's response to treatment, examination of patient, obtaining history from patient or surrogate, ordering and performing treatments and interventions, ordering and review of laboratory studies, ordering and review of radiographic studies, pulse oximetry and re-evaluation of patient's condition.   Aaron Aas1-3 Lead EKG Interpretation  Performed by: Norlene Beavers, MD Authorized by: Norlene Beavers, MD     Interpretation: normal     ECG rate:  60   ECG rate assessment: normal     Rhythm: sinus rhythm     Ectopy: none     Conduction: normal   Comments:     Patient placed on cardiac monitor to evaluate for arrhythmias    MEDICATIONS ORDERED IN ED: Medications  ondansetron  (ZOFRAN ) injection 4 mg (has no administration in time range)  aspirin chewable tablet 324 mg (has no administration in time range)  sodium chloride  flush (NS) 0.9 % injection 3 mL (3 mLs Intravenous Given 04/29/24 2313)  iohexol  (OMNIPAQUE ) 350 MG/ML injection 100 mL (100 mLs Intravenous Contrast Given 04/29/24 2318)     IMPRESSION / MDM / ASSESSMENT AND PLAN / ED COURSE  I reviewed the triage vital signs and the nursing notes.                             68 year old female presenting with sudden onset dizziness.  Differential diagnosis includes but is not limited to vertigo, CVA, TIA, metabolic, ACS, infectious etiologies, etc.  I personally reviewed patient's records and note PCP office visit from 04/16/2024 for annual physical exam.  Patient's  presentation is most consistent with acute illness / injury with system symptoms.  The patient is on the cardiac monitor to evaluate for evidence of arrhythmia and/or significant heart rate changes.  Patient was arrival to the emergency department.  Actively vomiting, will administer IV Zofran .  Awaiting teleneurology recommendations.  Clinical Course as of 04/29/24 2357  Mon Apr 29, 2024  2339 Appreciate teleneurology recommendations.  Patient is not a tPA candidate.  Recommend baby aspirin, admission for possible posterior circulation stroke. [JS]    Clinical Course User Index [JS] Norlene Beavers, MD     FINAL CLINICAL IMPRESSION(S) / ED DIAGNOSES   Final diagnoses:  Dizziness  Nausea and vomiting, unspecified vomiting type     Rx / DC Orders   ED Discharge Orders     None        Note:  This document was prepared using Dragon voice recognition software and may include unintentional dictation errors.   Jaxn Chiquito J, MD 04/30/24 0600

## 2024-04-30 ENCOUNTER — Observation Stay

## 2024-04-30 DIAGNOSIS — I10 Essential (primary) hypertension: Secondary | ICD-10-CM | POA: Insufficient documentation

## 2024-04-30 DIAGNOSIS — R42 Dizziness and giddiness: Secondary | ICD-10-CM | POA: Diagnosis not present

## 2024-04-30 DIAGNOSIS — E039 Hypothyroidism, unspecified: Secondary | ICD-10-CM | POA: Insufficient documentation

## 2024-04-30 LAB — PROTIME-INR
INR: 1.1 (ref 0.8–1.2)
Prothrombin Time: 14.1 s (ref 11.4–15.2)

## 2024-04-30 LAB — DIFFERENTIAL
Abs Immature Granulocytes: 0.05 10*3/uL (ref 0.00–0.07)
Basophils Absolute: 0 10*3/uL (ref 0.0–0.1)
Basophils Relative: 0 %
Eosinophils Absolute: 0 10*3/uL (ref 0.0–0.5)
Eosinophils Relative: 1 %
Immature Granulocytes: 1 %
Lymphocytes Relative: 9 %
Lymphs Abs: 0.6 10*3/uL — ABNORMAL LOW (ref 0.7–4.0)
Monocytes Absolute: 0.5 10*3/uL (ref 0.1–1.0)
Monocytes Relative: 7 %
Neutro Abs: 5.6 10*3/uL (ref 1.7–7.7)
Neutrophils Relative %: 82 %

## 2024-04-30 LAB — HIV ANTIBODY (ROUTINE TESTING W REFLEX): HIV Screen 4th Generation wRfx: NONREACTIVE

## 2024-04-30 LAB — COMPREHENSIVE METABOLIC PANEL WITH GFR
ALT: 16 U/L (ref 0–44)
AST: 17 U/L (ref 15–41)
Albumin: 3.2 g/dL — ABNORMAL LOW (ref 3.5–5.0)
Alkaline Phosphatase: 53 U/L (ref 38–126)
Anion gap: 5 (ref 5–15)
BUN: 21 mg/dL (ref 8–23)
CO2: 29 mmol/L (ref 22–32)
Calcium: 8.4 mg/dL — ABNORMAL LOW (ref 8.9–10.3)
Chloride: 103 mmol/L (ref 98–111)
Creatinine, Ser: 1.28 mg/dL — ABNORMAL HIGH (ref 0.44–1.00)
GFR, Estimated: 45 mL/min — ABNORMAL LOW (ref >60)
Glucose, Bld: 138 mg/dL — ABNORMAL HIGH (ref 70–99)
Potassium: 3.9 mmol/L (ref 3.5–5.1)
Sodium: 137 mmol/L (ref 135–145)
Total Bilirubin: 0.8 mg/dL (ref 0.0–1.2)
Total Protein: 6.1 g/dL — ABNORMAL LOW (ref 6.5–8.1)

## 2024-04-30 LAB — HEMOGLOBIN A1C
Hgb A1c MFr Bld: 5.6 % (ref 4.8–5.6)
Mean Plasma Glucose: 114.02 mg/dL

## 2024-04-30 LAB — LIPID PANEL
Cholesterol: 149 mg/dL (ref 0–200)
HDL: 33 mg/dL — ABNORMAL LOW (ref >40)
LDL Cholesterol: 86 mg/dL (ref 0–99)
Total CHOL/HDL Ratio: 4.5 ratio
Triglycerides: 152 mg/dL — ABNORMAL HIGH (ref <150)
VLDL: 30 mg/dL (ref 0–40)

## 2024-04-30 LAB — CBC
HCT: 30.4 % — ABNORMAL LOW (ref 36.0–46.0)
Hemoglobin: 10 g/dL — ABNORMAL LOW (ref 12.0–15.0)
MCH: 27.4 pg (ref 26.0–34.0)
MCHC: 32.9 g/dL (ref 30.0–36.0)
MCV: 83.3 fL (ref 80.0–100.0)
Platelets: 145 10*3/uL — ABNORMAL LOW (ref 150–400)
RBC: 3.65 MIL/uL — ABNORMAL LOW (ref 3.87–5.11)
RDW: 14.6 % (ref 11.5–15.5)
WBC: 6.8 10*3/uL (ref 4.0–10.5)
nRBC: 0 % (ref 0.0–0.2)

## 2024-04-30 LAB — ETHANOL: Alcohol, Ethyl (B): <15 mg/dL (ref <15)

## 2024-04-30 LAB — TROPONIN I (HIGH SENSITIVITY)
Troponin I (High Sensitivity): 3 ng/L (ref <18)
Troponin I (High Sensitivity): 3 ng/L (ref <18)

## 2024-04-30 LAB — APTT: aPTT: 31 s (ref 24–36)

## 2024-04-30 MED ORDER — CYANOCOBALAMIN 500 MCG PO TABS
1000.0000 ug | ORAL_TABLET | Freq: Every day | ORAL | Status: DC
  Administered 2024-04-30: 1000 ug via ORAL
  Filled 2024-04-30: qty 2

## 2024-04-30 MED ORDER — ASPIRIN 300 MG RE SUPP
300.0000 mg | Freq: Every day | RECTAL | Status: DC
Start: 2024-04-30 — End: 2024-04-30

## 2024-04-30 MED ORDER — BISOPROLOL-HYDROCHLOROTHIAZIDE 5-6.25 MG PO TABS
1.0000 | ORAL_TABLET | Freq: Every day | ORAL | Status: DC
  Filled 2024-04-30: qty 1

## 2024-04-30 MED ORDER — ONDANSETRON HCL 4 MG/2ML IJ SOLN: 4.0000 mg | INTRAMUSCULAR | Status: DC | PRN

## 2024-04-30 MED ORDER — ACETAMINOPHEN 325 MG PO TABS: 650.0000 mg | ORAL_TABLET | ORAL | Status: DC | PRN

## 2024-04-30 MED ORDER — ENOXAPARIN SODIUM 60 MG/0.6ML IJ SOSY
0.5000 mg/kg | PREFILLED_SYRINGE | INTRAMUSCULAR | Status: DC
  Administered 2024-04-30: 62.5 mg via SUBCUTANEOUS
  Filled 2024-04-30: qty 1.2

## 2024-04-30 MED ORDER — TRAZODONE HCL 50 MG PO TABS: 25.0000 mg | ORAL_TABLET | Freq: Every evening | ORAL | Status: DC | PRN

## 2024-04-30 MED ORDER — SENNOSIDES-DOCUSATE SODIUM 8.6-50 MG PO TABS: 1.0000 | ORAL_TABLET | Freq: Every evening | ORAL | Status: DC | PRN

## 2024-04-30 MED ORDER — ISOSORBIDE MONONITRATE ER 60 MG PO TB24
30.0000 mg | ORAL_TABLET | Freq: Every day | ORAL | Status: DC
  Administered 2024-04-30: 30 mg via ORAL
  Filled 2024-04-30: qty 1

## 2024-04-30 MED ORDER — LEVOTHYROXINE SODIUM 50 MCG PO TABS
150.0000 ug | ORAL_TABLET | Freq: Every day | ORAL | Status: DC
  Administered 2024-04-30: 150 ug via ORAL
  Filled 2024-04-30: qty 3

## 2024-04-30 MED ORDER — TOPIRAMATE 25 MG PO TABS: 50.0000 mg | ORAL_TABLET | Freq: Every day | ORAL | Status: DC

## 2024-04-30 MED ORDER — LORATADINE 10 MG PO TABS
10.0000 mg | ORAL_TABLET | Freq: Every day | ORAL | Status: DC
  Administered 2024-04-30: 10 mg via ORAL
  Filled 2024-04-30: qty 1

## 2024-04-30 MED ORDER — ACETAMINOPHEN 160 MG/5ML PO SOLN: 650.0000 mg | ORAL | Status: DC | PRN

## 2024-04-30 MED ORDER — HYDROCHLOROTHIAZIDE 12.5 MG PO TABS
6.2500 mg | ORAL_TABLET | Freq: Every day | ORAL | Status: DC
  Administered 2024-04-30: 6.25 mg via ORAL
  Filled 2024-04-30: qty 1

## 2024-04-30 MED ORDER — MECLIZINE HCL 25 MG PO TABS: 12.5000 mg | ORAL_TABLET | Freq: Three times a day (TID) | ORAL | Status: DC | PRN

## 2024-04-30 MED ORDER — BISOPROLOL FUMARATE 5 MG PO TABS
5.0000 mg | ORAL_TABLET | Freq: Every day | ORAL | Status: DC
  Administered 2024-04-30: 5 mg via ORAL
  Filled 2024-04-30: qty 1

## 2024-04-30 MED ORDER — AZELASTINE HCL 0.1 % NA SOLN: 2.0000 | Freq: Two times a day (BID) | NASAL | Status: DC | PRN

## 2024-04-30 MED ORDER — MAGNESIUM OXIDE -MG SUPPLEMENT 400 (240 MG) MG PO TABS
400.0000 mg | ORAL_TABLET | Freq: Two times a day (BID) | ORAL | Status: DC
  Administered 2024-04-30: 400 mg via ORAL
  Filled 2024-04-30: qty 1

## 2024-04-30 MED ORDER — CALCIUM CARBONATE 1250 (500 CA) MG PO TABS: 1250.0000 mg | ORAL_TABLET | Freq: Every day | ORAL | Status: DC

## 2024-04-30 MED ORDER — ASPIRIN 325 MG PO TABS
325.0000 mg | ORAL_TABLET | Freq: Every day | ORAL | Status: DC
  Administered 2024-04-30: 325 mg via ORAL
  Filled 2024-04-30: qty 1

## 2024-04-30 MED ORDER — ACETAMINOPHEN 325 MG RE SUPP: 650.0000 mg | RECTAL | Status: DC | PRN

## 2024-04-30 MED ORDER — PANTOPRAZOLE SODIUM 40 MG PO TBEC
40.0000 mg | DELAYED_RELEASE_TABLET | Freq: Two times a day (BID) | ORAL | Status: DC
  Administered 2024-04-30: 40 mg via ORAL
  Filled 2024-04-30: qty 1

## 2024-04-30 MED ORDER — ASPIRIN 81 MG PO TBEC: 81.0000 mg | DELAYED_RELEASE_TABLET | Freq: Every day | ORAL | Status: DC

## 2024-04-30 MED ORDER — GABAPENTIN 300 MG PO CAPS: 300.0000 mg | ORAL_CAPSULE | Freq: Every day | ORAL | Status: DC

## 2024-04-30 MED ORDER — DOCUSATE SODIUM 100 MG PO CAPS
100.0000 mg | ORAL_CAPSULE | Freq: Two times a day (BID) | ORAL | Status: DC
  Administered 2024-04-30: 100 mg via ORAL
  Filled 2024-04-30: qty 1

## 2024-04-30 MED ORDER — SODIUM CHLORIDE 0.9 % IV SOLN: INTRAVENOUS | Status: DC

## 2024-04-30 MED ORDER — MECLIZINE HCL 25 MG PO TABS: 25.0000 mg | ORAL_TABLET | Freq: Three times a day (TID) | ORAL | 0 refills | Status: AC | PRN

## 2024-04-30 MED ORDER — ESTRADIOL 0.1 MG/GM VA CREA
0.2500 | TOPICAL_CREAM | VAGINAL | Status: DC
  Filled 2024-04-30: qty 42.5

## 2024-04-30 MED ORDER — STROKE: EARLY STAGES OF RECOVERY BOOK: Freq: Once | Status: DC

## 2024-04-30 NOTE — Assessment & Plan Note (Signed)
Will continue antihypertensive therapy.

## 2024-04-30 NOTE — Evaluation (Signed)
 Occupational Therapy Evaluation Patient Details Name: Rebekah Perkins MRN: [id number] DOB: Mar 26, 1956 Today's Date: 04/30/2024   History of Present Illness   Rebekah Perkins is a 68yoF who presents to [facility] ED on 04/29/24 with acute onset dizziness, vertigo, vomiting. Pt seen by neurology who reports consistent with acute vestibular syndrome. Ddx includes acute vestibulopathy vs vestibular migraine vs posterior circulation ischemic stroke. PMH: HTN, obesity, GAD, OSA, PVD.     Clinical Impressions Chart reviewed, pt greeted in bed, agreeable to OT evaluation. PTA pt is generally MOD I-I in ADL/IADL, amb limited community distances due to knee pain (she says she will be seeing ortho soon). Pt is performing ADL/IADL at  a supervision-MOD I level on this date. Educated pt re: safe ADL completion with DME at home. No c/o dizziness/vision changes during mobility. OT will sign off at this time. Please re consult if there is a change in functional status.      If plan is discharge home, recommend the following:   Assistance with cooking/housework     Functional Status Assessment   Patient has not had a recent decline in their functional status     Equipment Recommendations   None recommended by OT     Recommendations for Other Services         Precautions/Restrictions   Precautions Precautions: Fall Recall of Precautions/Restrictions: Intact     Mobility Bed Mobility Overal bed mobility: Modified Independent                  Transfers Overall transfer level: Modified independent                        Balance Overall balance assessment: Needs assistance Sitting-balance support: Feet supported Sitting balance-Leahy Scale: Good     Standing balance support: No upper extremity supported Standing balance-Leahy Scale: Good                             ADL either performed or assessed with clinical judgement   ADL Overall ADL's : Needs  assistance/impaired Eating/Feeding: Independent   Grooming: Modified independent;Sitting               Lower Body Dressing: Supervision/safety;Sitting/lateral leans Lower Body Dressing Details (indicate cue type and reason): socks Toilet Transfer: Modified Independent;Ambulation;Regular Toilet;Grab bars   Toileting- Clothing Manipulation and Hygiene: Modified independent;Sit to/from stand       Functional mobility during ADLs: Modified independent (approx 100' with no AD)       Vision Patient Visual Report: No change from baseline       Perception         Praxis         Pertinent Vitals/Pain Pain Assessment Pain Assessment: No/denies pain     Extremity/Trunk Assessment Upper Extremity Assessment Upper Extremity Assessment: Overall WFL for tasks assessed   Lower Extremity Assessment Lower Extremity Assessment: Overall WFL for tasks assessed       Communication Communication Communication: No apparent difficulties   Cognition Arousal: Alert Behavior During Therapy: WFL for tasks assessed/performed Cognition: No apparent impairments                               Following commands: Intact       Cueing  General Comments   Cueing Techniques: Verbal cues  vss throughout   Exercises Other Exercises Other Exercises: edu  re: role of OT, role of rehab, discharge recommendations, importance of continued mobility   Shoulder Instructions      Home Living Family/patient expects to be discharged to:: Private residence Living Arrangements: Alone;Children Available Help at Discharge: Family Type of Home: House Home Access: Stairs to enter [employer] of Steps: 2   Home Layout: One level     Bathroom Shower/Tub: Walk-in shower         Home Equipment: Pharmacist, hospital (2 wheels);Cane - single point          Prior Functioning/Environment Prior Level of Function : Independent/Modified Independent;Driving              Mobility Comments: amb with no AD; limited at times due to knee pain ADLs Comments: MOD I-I in ADL/IADL; 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    OT Problem List:     OT Treatment/Interventions:        OT Goals(Current goals can be found in the care plan section)   Acute Rehab OT Goals Patient Stated Goal: go home OT Goal Formulation: With patient Time For Goal Achievement: 05/14/24 Potential to Achieve Goals: Good   OT Frequency:       Co-evaluation              AM-PAC OT 6 Clicks Daily Activity     Outcome Measure Help from another person eating meals?: None Help from another person taking care of personal grooming?: None Help from another person toileting, which includes using toliet, bedpan, or urinal?: None Help from another person bathing (including washing, rinsing, drying)?: None Help from another person to put on and taking off regular upper body clothing?: None Help from another person to put on and taking off regular lower body clothing?: None 6 Click Score: 24   End of Session Nurse Communication: Mobility status  Activity Tolerance: Patient tolerated treatment well Patient left: in bed;with call Rebekah/phone within reach;with family/visitor present                   Time: 1610-9604 OT Time Calculation (min): 10 min Charges:  OT General Charges $OT Visit: 1 Visit OT Evaluation $OT Eval Low Complexity: 1 Low  Gerre Kraft, OTD OTR/L  04/30/24, 2:20 PM

## 2024-04-30 NOTE — H&P (Addendum)
 [place]   PATIENT NAME: Rebekah Perkins    MR#:  [id number]  DATE OF BIRTH:  1955/12/11  DATE OF ADMISSION:  04/29/2024  PRIMARY CARE PHYSICIAN: Yehuda Helms, MD   Patient is coming from: Home  REQUESTING/REFERRING PHYSICIAN: Meredith Stalls, MD  CHIEF COMPLAINT:   Chief Complaint  Patient presents with   Dizziness    HISTORY OF PRESENT ILLNESS:  Rebekah Perkins is a 68 y.o. female with medical history significant for essential hypertension, obesity, anxiety, OSA and PVD, who presented to the emergency room with a [place] of dizziness with associated vertigo and vomiting since 9 PM.  She stated that she had an ear infection 3 weeks ago.  No chest pain or palpitations.  No cough or wheezing or dyspnea.  No paresthesias or focal muscle weakness.  No dysphagia or dysarthria.  No urinary or stool incontinence.  No bleeding diathesis.  ED Course: When she came to the ER, BP was 155/71 with normal vital signs.  Labs revealed a creatinine 1.28 with calcium  8.4 albumin 3.2 total protein 6.1.  High-sensitivity troponin was 3 twice and CBC showed anemia slightly worse than previous levels with hemoglobin 10 and hematocrit 30.4 compared to 11.7 and 36.8 on 03/13/2022.  Coag profile was normal. EKG as reviewed by me : EKG showed sinus bradycardia with a rate of 54 with first-degree AV block and low voltage QRS. Imaging: Noncontrast head CT scan showed no acute intracranial abnormalities. - CTA of the head and neck revealed the following: 1. No emergent large vessel occlusion or proximal hemodynamically significant stenosis. 2. No evidence of core infarct or penumbra on CT perfusion. 3. Aortic atherosclerosis.  I ordered a brain MRI without contrast that came back negative for acute endocrine abnormality.  The patient was given 4 mg of IV Zofran  and will be placed on as needed Antivert.  She will be admitted to the medical telemetry observation bed for further evaluation and  management.  Past Medical History:  Diagnosis Date   Anxiety    Apnea    Complication of anesthesia    slow to wake up afterwards   Diverticulitis    Environmental allergies    Hypertension    Menopausal state    Obesity    PMB (postmenopausal bleeding)    PVD (peripheral vascular disease) (HCC)    Rosacea    Simple endometrial hyperplasia    Sleep apnea    Thyroid  disease    Urinary urgency     PAST SURGICAL HISTORY:   Past Surgical History:  Procedure Laterality Date   CHOLECYSTECTOMY     COLONOSCOPY WITH PROPOFOL  N/A 05/05/2021   Procedure: COLONOSCOPY WITH PROPOFOL ;  Surgeon: Selena Daily, MD;  Location: [facility] ENDOSCOPY;  Service: Gastroenterology;  Laterality: N/A;   DILATION AND CURETTAGE OF UTERUS     ESOPHAGOGASTRODUODENOSCOPY (EGD) WITH PROPOFOL  N/A 02/03/2021   Procedure: ESOPHAGOGASTRODUODENOSCOPY (EGD) WITH PROPOFOL ;  Surgeon: Selena Daily, MD;  Location: [facility] ENDOSCOPY;  Service: Gastroenterology;  Laterality: N/A;   HYSTEROSCOPY      SOCIAL HISTORY:   Social History   Tobacco Use   Smoking status: Never    Passive exposure: Yes   Smokeless tobacco: Never  Substance Use Topics   Alcohol use: No    FAMILY HISTORY:   Family History  Problem Relation Age of Onset   Diabetes Mother    Heart disease Mother    Diabetes Sister    Breast cancer Sister  Cancer Sister    Breast cancer Maternal Aunt 60   Lung cancer Maternal Aunt    Cancer Maternal Grandmother    Colon cancer Maternal Grandmother    Breast cancer Maternal Grandmother 85   Ovarian cancer Neg Hx     DRUG ALLERGIES:   Allergies  Allergen Reactions   Azithromycin Itching   Penicillins Itching    REVIEW OF SYSTEMS:   ROS As per history of present illness. All pertinent systems were reviewed above. Constitutional, HEENT, cardiovascular, respiratory, GI, GU, musculoskeletal, neuro, psychiatric, endocrine, integumentary and hematologic systems were reviewed and are  otherwise negative/unremarkable except for positive findings mentioned above in the HPI.   MEDICATIONS AT HOME:   Prior to Admission medications   Medication Sig Start Date End Date Taking? Authorizing Provider  aspirin EC 81 MG tablet Take 81 mg by mouth daily.   Yes [provider]  azelastine  (ASTELIN ) 0.1 % nasal spray Place 2 sprays into both nostrils 2 (two) times daily. For seasonal allergies 01/25/24  Yes Nelda Balsam, NP  bisoprolol -hydrochlorothiazide  (ZIAC ) 5-6.25 MG tablet Take 1 tablet by mouth once daily 06/15/21  Yes   calcium  carbonate (OS-CAL) 600 MG TABS tablet Take 600 mg by mouth at bedtime.   Yes [provider]  celecoxib  (CELEBREX ) 200 MG capsule Take 1 capsule (200 mg total) by mouth 2 (two) times daily 05/31/21  Yes   cetirizine (ZYRTEC) 10 MG tablet Take 10 mg by mouth at bedtime.   Yes [provider]  docusate sodium (COLACE) 100 MG capsule Take 100 mg by mouth 2 (two) times daily.   Yes [provider]  estradiol  (ESTRACE ) 0.1 MG/GM vaginal cream Place 0.25 Applicatorfuls vaginally 3 (three) times a week. 04/22/24  Yes Nelda Balsam, NP  gabapentin  (NEURONTIN ) 300 MG capsule Take 1 capsule by mouth nightly 06/15/21  Yes   imiquimod  (ALDARA ) 5 % cream Apply pea sized amount to right vulvar lesion on Monday-Wednesday-Friday at bedtime. Wash off in morning. Apply vaseline to non-treatment skin. 04/12/24  Yes Nelda Balsam, NP  isosorbide  mononitrate (IMDUR ) 30 MG 24 hr tablet TAKE 1 TABLET BY MOUTH DAILY 08/26/21 04/30/24 Yes   levothyroxine  (SYNTHROID ) 150 MCG tablet Take 1 tablet (150 mcg total) by mouth once daily. Take on an empty stomach with a glass of water at least 30-60 minutes before breakfast. 06/23/21  Yes   magnesium  oxide (MAG-OX) 400 MG tablet Take 1 tablet by mouth 2 (two) times daily. 06/26/23  Yes [provider]  Multiple Vitamins-Minerals (PRESERVISION AREDS 2+MULTI VIT PO) Take 1 capsule by mouth in the morning  and at bedtime.   Yes [provider]  pantoprazole  (PROTONIX ) 40 MG tablet Take 40 mg by mouth 2 (two) times daily.   Yes [provider]  topiramate  (TOPAMAX ) 50 MG tablet Take 1 tablet by mouth at bedtime. 06/29/23 06/28/24 Yes [provider]  vitamin B-12 (CYANOCOBALAMIN) 1000 MCG tablet Take 1,000 mcg by mouth daily.   Yes [provider]  fluticasone  (FLONASE ) 50 MCG/ACT nasal spray Place 2 sprays into both nostrils daily. Patient not taking: Reported on 04/30/2024 06/23/21     hydrochlorothiazide  (HYDRODIURIL ) 25 MG tablet TAKE 1 TABLET BY MOUTH ONCE DAILY Patient not taking: Reported on 04/30/2024 03/31/21 04/11/24    ondansetron  (ZOFRAN ) 8 MG tablet Take 8 mg by mouth every 8 (eight) hours as needed. Patient not taking: Reported on 07/12/2023 07/01/22   [provider]  Potassium 99 MG TABS Take 99  mg by mouth in the morning and at bedtime. Patient not taking: Reported on 01/17/2024    [provider]  tiZANidine  (ZANAFLEX ) 4 MG tablet Take by mouth. Patient not taking: Reported on 04/30/2024 03/29/23   [provider]  phentermine  (ADIPEX-P ) 37.5 MG tablet Take 1 tablet (37.5 mg total) by mouth once daily Patient not taking: Reported on 04/11/2024 03/05/21 07/09/21        VITAL SIGNS:  Blood pressure 139/76, pulse (!) 56, temperature 98 F (36.7 C), temperature source Oral, resp. rate 19, height 5' 3 (1.6 m), weight 122.5 kg, SpO2 96%.  PHYSICAL EXAMINATION:  Physical Exam  GENERAL:  68 y.o.-year-old female patient lying in the bed with no acute distress.  EYES: Pupils equal, round, reactive to light and accommodation. No scleral icterus. Extraocular muscles intact.  HEENT: Head atraumatic, normocephalic. Oropharynx and nasopharynx clear.  NECK:  Supple, no jugular venous distention. No thyroid  enlargement, no tenderness.  LUNGS: Normal breath sounds bilaterally, no wheezing, rales,rhonchi or crepitation. No use of accessory  muscles of respiration.  CARDIOVASCULAR: Regular rate and rhythm, S1, S2 normal. No murmurs, rubs, or gallops.  ABDOMEN: Soft, nondistended, nontender. Bowel sounds present. No organomegaly or mass.  EXTREMITIES: No pedal edema, cyanosis, or clubbing.  NEUROLOGIC: Cranial nerves II through XII are intact. Muscle strength 5/5 in all extremities. Sensation intact. Gait not checked.  PSYCHIATRIC: The patient is alert and oriented x 3.  Normal affect and good eye contact. SKIN: No obvious rash, lesion, or ulcer.   LABORATORY PANEL:   CBC Recent Labs  Lab 04/29/24 2323  WBC 6.8  HGB 10.0*  HCT 30.4*  PLT 145*   ------------------------------------------------------------------------------------------------------------------  Chemistries  Recent Labs  Lab 04/29/24 2323  NA 137  K 3.9  CL 103  CO2 29  GLUCOSE 138*  BUN 21  CREATININE 1.28*  CALCIUM  8.4*  AST 17  ALT 16  ALKPHOS 53  BILITOT 0.8   ------------------------------------------------------------------------------------------------------------------  Cardiac Enzymes No results for input(s): TROPONINI in the last 168 hours. ------------------------------------------------------------------------------------------------------------------  RADIOLOGY:  MR BRAIN WO CONTRAST Result Date: 04/30/2024 CLINICAL DATA:  Neuro deficit, acute, stroke suspected EXAM: MRI HEAD WITHOUT CONTRAST TECHNIQUE: Multiplanar, multiecho pulse sequences of the brain and surrounding structures were obtained without intravenous contrast. COMPARISON:  Same day CT head. FINDINGS: Brain: No acute infarction, hemorrhage, hydrocephalus, extra-axial collection or mass lesion. Vascular: Major arterial flow voids are maintained skull base. Skull and upper cervical spine: Normal marrow signal. Sinuses/Orbits: Clear sinuses.  No acute orbital findings. Other: No mastoid effusions. IMPRESSION: No evidence of acute intracranial abnormality. Electronically  Signed   By: Stevenson Elbe M.D.   On: 04/30/2024 03:57   CT ANGIO HEAD NECK W WO CM W PERF (CODE STROKE) Result Date: 04/29/2024 CLINICAL DATA:  Neuro deficit, acute, stroke suspected EXAM: CT ANGIOGRAPHY HEAD AND NECK CT PERFUSION BRAIN TECHNIQUE: Multidetector CT imaging of the head and neck was performed using the standard protocol during bolus administration of intravenous contrast. Multiplanar CT image reconstructions and MIPs were obtained to evaluate the vascular anatomy. Carotid stenosis measurements (when applicable) are obtained utilizing NASCET criteria, using the distal internal carotid diameter as the denominator. Multiphase CT imaging of the brain was performed following IV bolus contrast injection. Subsequent parametric perfusion maps were calculated using RAPID software. RADIATION DOSE REDUCTION: This exam was performed according to the departmental dose-optimization program which includes automated exposure control, adjustment of the mA and/or kV according to patient size and/or use of iterative reconstruction technique. CONTRAST:  OMNIPAQUE  IOHEXOL  350 MG/ML SOLN COMPARISON:  None Available. FINDINGS: CTA NECK FINDINGS Aortic arch: Great vessel origins are patent without significant stenosis. Aortic atherosclerosis. Right carotid system: No evidence of dissection, stenosis (50% or greater) or occlusion. Left carotid system: No evidence of dissection, stenosis (50% or greater) or occlusion. Vertebral arteries: Right-dominant. No evidence of dissection, stenosis (50% or greater) or occlusion. Skeleton: No acute abnormality on limited assessment. Multilevel degenerative change in the cervical spine. Other neck: No acute abnormality on limited assessment. Upper chest: Visualized lung apices are clear. Review of the MIP images confirms the above findings CTA HEAD FINDINGS Anterior circulation: Bilateral intracranial ICAs, MCAs, and ACAs are patent without proximal hemodynamically  significant stenosis. Posterior circulation: Bilateral intradural vertebral arteries, basilar artery and bilateral posterior rods are patent without proximal and amic least significant stenosis. Small/non dominant left intradural vertebral artery and right fetal type PCA, anatomic variants. Venous sinuses: As permitted by contrast timing, patent. Anatomic variants: See above. Review of the MIP images confirms the above findings CT Brain Perfusion Findings: ASPECTS: 10. CBF (<30%) Volume: 0mL Perfusion (Tmax>6.0s) volume: 0mL Mismatch Volume: 0mL Infarction Location:None identified. IMPRESSION: 1. No emergent large vessel occlusion or proximal hemodynamically significant stenosis. 2. No evidence of core infarct or penumbra on CT perfusion. 3. Aortic atherosclerosis (ICD10-I70.0). Electronically Signed   By: Stevenson Elbe M.D.   On: 04/29/2024 23:36   CT HEAD CODE STROKE WO CONTRAST Result Date: 04/29/2024 CLINICAL DATA:  Code stroke.  Neuro deficit, acute, stroke suspected EXAM: CT HEAD WITHOUT CONTRAST TECHNIQUE: Contiguous axial images were obtained from the base of the skull through the vertex without intravenous contrast. RADIATION DOSE REDUCTION: This exam was performed according to the departmental dose-optimization program which includes automated exposure control, adjustment of the mA and/or kV according to patient size and/or use of iterative reconstruction technique. COMPARISON:  CT head April 30, 2021. FINDINGS: Brain: No evidence of acute large vascular territory infarction, hemorrhage, hydrocephalus, extra-axial collection or mass lesion/mass effect. Vascular: No hyperdense vessel. Skull: No acute fracture. Sinuses/Orbits: Clear sinuses.  No acute orbital findings. Other: No mastoid effusions. ASPECTS [facility] Stroke Program Early CT Score) Total score (0-10 with 10 being normal): 10. IMPRESSION: 1. No evidence of acute intracranial abnormality. 2. ASPECTS is 10 Code stroke imaging results were  communicated on 04/29/2024 at 11:07 pm to provider Dr. Hendrick Locke Via telephone. Electronically Signed   By: Stevenson Elbe M.D.   On: 04/29/2024 23:08      IMPRESSION AND PLAN:  Assessment and Plan: * Dizziness - This is associated with vertigo. - It is highly suspicious for benign positional vertigo. - The patient will be admitted to an observation medical telemetry bed. - PT and OT consults will be obtained. - Will place the patient on as needed Antivert. -As needed antiemetics will be provided.  Essential hypertension - Will continue antihypertensive therapy.  Hypothyroidism - Will continue Synthroid .     DVT prophylaxis: Lovenox. Advanced Care Planning:  Code Status: She is DNR only. Family Communication:  The plan of care was discussed in details with the patient (and family). I answered all questions. The patient agreed to proceed with the above mentioned plan. Further management will depend upon hospital course. Disposition Plan: Back to previous home environment Consults called: none. All the records are reviewed and case discussed with ED provider.  Status is: Observation  I certify that at the time of admission, it is my clinical judgment that the patient will require hospital care extending  less than 2 midnights.                            Dispo: The patient is from: Home              Anticipated d/c is to: Home              Patient currently is not medically stable to d/c.              Difficult to place patient: No  Virgene Griffin M.D on 04/30/2024 at 5:34 AM  [facility]   From 7 PM-7 AM, contact night-coverage [email or web address]  CC: Primary care physician; Yehuda Helms, MD

## 2024-04-30 NOTE — Discharge Summary (Signed)
 Physician Discharge Summary  Rebekah Perkins EAV:[id number] DOB: [birth year] DOA: 04/29/2024  PCP: Yehuda Helms, MD  Admit date: 04/29/2024 Discharge date: 04/30/2024  Admitted From: Home Disposition:  Home  Recommendations for Outpatient Follow-up:  Follow up with PCP in 1-2 weeks   Home Health:No  Equipment/Devices:None   Discharge Condition:Stable  CODE STATUS:FULL  Diet recommendation: Reg  Brief/Interim Summary: 68 y.o. female with medical history significant for essential hypertension, obesity, anxiety, OSA and PVD, who presented to the emergency room with a [place] of dizziness with associated vertigo and vomiting since 9 PM.  She stated that she had an ear infection 3 weeks ago.  No chest pain or palpitations.  No cough or wheezing or dyspnea.  No paresthesias or focal muscle weakness.  No dysphagia or dysarthria.  No urinary or stool incontinence.  No bleeding diathesis.   Seen in consultation by teleneurology who recommended MRI brain.  This is negative for acute CVA.  CTA negative for LVO.  Seen in consultation by physical therapy.  Patient did well.  Also Occupational Therapy and speech therapy recommended no follow-up.  Patient back to baseline.  I suspect some transient disequilibrium/vestibular dysfunction.  Patient stable for discharge home at this time.  Will recommend as needed meclizine on DC.    Discharge Diagnoses:  Principal Problem:   Dizziness Active Problems:   Hypothyroidism   Essential hypertension   * Dizziness Unclear etiology.  Associated with vertigo.  Possibly BPPV.  MRI and CTA negative.  Lower suspicion for TIA though unable to totally exclude.  Patient did well with physical therapy with no nystagmus noted on exam.  No follow-up recommended.  As needed meclizine.  Stable for discharge home.     Discharge Instructions  Discharge Instructions     Diet - low sodium heart healthy   Complete by: As directed    Increase activity slowly    Complete by: As directed       Allergies as of 04/30/2024       Reactions   Azithromycin Itching   Penicillins Itching        Medication List     STOP taking these medications    fluticasone  50 MCG/ACT nasal spray Commonly known as: FLONASE    hydrochlorothiazide  25 MG tablet Commonly known as: HYDRODIURIL    ondansetron  8 MG tablet Commonly known as: ZOFRAN    Potassium 99 MG Tabs   tiZANidine  4 MG tablet Commonly known as: ZANAFLEX        TAKE these medications    aspirin EC 81 MG tablet Take 81 mg by mouth daily.   azelastine  0.1 % nasal spray Commonly known as: ASTELIN  Place 2 sprays into both nostrils 2 (two) times daily. For seasonal allergies   bisoprolol -hydrochlorothiazide  5-6.25 MG tablet Commonly known as: ZIAC  Take 1 tablet by mouth once daily   calcium  carbonate 600 MG Tabs tablet Commonly known as: OS-CAL Take 600 mg by mouth at bedtime.   celecoxib  200 MG capsule Commonly known as: CELEBREX  Take 1 capsule (200 mg total) by mouth 2 (two) times daily   cetirizine 10 MG tablet Commonly known as: ZYRTEC Take 10 mg by mouth at bedtime.   cyanocobalamin 1000 MCG tablet Commonly known as: VITAMIN B12 Take 1,000 mcg by mouth daily.   docusate sodium 100 MG capsule Commonly known as: COLACE Take 100 mg by mouth 2 (two) times daily.   estradiol  0.1 MG/GM vaginal cream Commonly known as: ESTRACE  Place 0.25 Applicatorfuls vaginally 3 (three) times a week.  gabapentin  300 MG capsule Commonly known as: NEURONTIN  Take 1 capsule by mouth nightly   imiquimod  5 % cream Commonly known as: Aldara  Apply pea sized amount to right vulvar lesion on Monday-Wednesday-Friday at bedtime. Wash off in morning. Apply vaseline to non-treatment skin.   isosorbide  mononitrate 30 MG 24 hr tablet Commonly known as: IMDUR  TAKE 1 TABLET BY MOUTH DAILY   levothyroxine  150 MCG tablet Commonly known as: SYNTHROID  Take 1 tablet (150 mcg total) by mouth once  daily. Take on an empty stomach with a glass of water at least 30-60 minutes before breakfast.   magnesium  oxide 400 MG tablet Commonly known as: MAG-OX Take 1 tablet by mouth 2 (two) times daily.   meclizine 25 MG tablet Commonly known as: ANTIVERT Take 1 tablet (25 mg total) by mouth 3 (three) times daily as needed for dizziness.   pantoprazole  40 MG tablet Commonly known as: PROTONIX  Take 40 mg by mouth 2 (two) times daily.   PRESERVISION AREDS 2+MULTI VIT PO Take 1 capsule by mouth in the morning and at bedtime.   topiramate  50 MG tablet Commonly known as: TOPAMAX  Take 1 tablet by mouth at bedtime.        Allergies  Allergen Reactions   Azithromycin Itching   Penicillins Itching    Consultations: Tele neurology   Procedures/Studies: MR BRAIN WO CONTRAST Result Date: 04/30/2024 CLINICAL DATA:  Neuro deficit, acute, stroke suspected EXAM: MRI HEAD WITHOUT CONTRAST TECHNIQUE: Multiplanar, multiecho pulse sequences of the brain and surrounding structures were obtained without intravenous contrast. COMPARISON:  Same day CT head. FINDINGS: Brain: No acute infarction, hemorrhage, hydrocephalus, extra-axial collection or mass lesion. Vascular: Major arterial flow voids are maintained skull base. Skull and upper cervical spine: Normal marrow signal. Sinuses/Orbits: Clear sinuses.  No acute orbital findings. Other: No mastoid effusions. IMPRESSION: No evidence of acute intracranial abnormality. Electronically Signed   By: Stevenson Elbe M.D.   On: 04/30/2024 03:57   CT ANGIO HEAD NECK W WO CM W PERF (CODE STROKE) Result Date: 04/29/2024 CLINICAL DATA:  Neuro deficit, acute, stroke suspected EXAM: CT ANGIOGRAPHY HEAD AND NECK CT PERFUSION BRAIN TECHNIQUE: Multidetector CT imaging of the head and neck was performed using the standard protocol during bolus administration of intravenous contrast. Multiplanar CT image reconstructions and MIPs were obtained to evaluate the vascular  anatomy. Carotid stenosis measurements (when applicable) are obtained utilizing NASCET criteria, using the distal internal carotid diameter as the denominator. Multiphase CT imaging of the brain was performed following IV bolus contrast injection. Subsequent parametric perfusion maps were calculated using RAPID software. RADIATION DOSE REDUCTION: This exam was performed according to the departmental dose-optimization program which includes automated exposure control, adjustment of the mA and/or kV according to patient size and/or use of iterative reconstruction technique. CONTRAST:  OMNIPAQUE  IOHEXOL  350 MG/ML SOLN COMPARISON:  None Available. FINDINGS: CTA NECK FINDINGS Aortic arch: Great vessel origins are patent without significant stenosis. Aortic atherosclerosis. Right carotid system: No evidence of dissection, stenosis (50% or greater) or occlusion. Left carotid system: No evidence of dissection, stenosis (50% or greater) or occlusion. Vertebral arteries: Right-dominant. No evidence of dissection, stenosis (50% or greater) or occlusion. Skeleton: No acute abnormality on limited assessment. Multilevel degenerative change in the cervical spine. Other neck: No acute abnormality on limited assessment. Upper chest: Visualized lung apices are clear. Review of the MIP images confirms the above findings CTA HEAD FINDINGS Anterior circulation: Bilateral intracranial ICAs, MCAs, and ACAs are patent without proximal hemodynamically significant stenosis. Posterior  circulation: Bilateral intradural vertebral arteries, basilar artery and bilateral posterior rods are patent without proximal and amic least significant stenosis. Small/non dominant left intradural vertebral artery and right fetal type PCA, anatomic variants. Venous sinuses: As permitted by contrast timing, patent. Anatomic variants: See above. Review of the MIP images confirms the above findings CT Brain Perfusion Findings: ASPECTS: 10. CBF (<30%) Volume:  0mL Perfusion (Tmax>6.0s) volume: 0mL Mismatch Volume: 0mL Infarction Location:None identified. IMPRESSION: 1. No emergent large vessel occlusion or proximal hemodynamically significant stenosis. 2. No evidence of core infarct or penumbra on CT perfusion. 3. Aortic atherosclerosis (ICD10-I70.0). Electronically Signed   By: Stevenson Elbe M.D.   On: 04/29/2024 23:36   CT HEAD CODE STROKE WO CONTRAST Result Date: 04/29/2024 CLINICAL DATA:  Code stroke.  Neuro deficit, acute, stroke suspected EXAM: CT HEAD WITHOUT CONTRAST TECHNIQUE: Contiguous axial images were obtained from the base of the skull through the vertex without intravenous contrast. RADIATION DOSE REDUCTION: This exam was performed according to the departmental dose-optimization program which includes automated exposure control, adjustment of the mA and/or kV according to patient size and/or use of iterative reconstruction technique. COMPARISON:  CT head April 30, 2021. FINDINGS: Brain: No evidence of acute large vascular territory infarction, hemorrhage, hydrocephalus, extra-axial collection or mass lesion/mass effect. Vascular: No hyperdense vessel. Skull: No acute fracture. Sinuses/Orbits: Clear sinuses.  No acute orbital findings. Other: No mastoid effusions. ASPECTS [facility] Stroke Program Early CT Score) Total score (0-10 with 10 being normal): 10. IMPRESSION: 1. No evidence of acute intracranial abnormality. 2. ASPECTS is 10 Code stroke imaging results were communicated on 04/29/2024 at 11:07 pm to provider Dr. Hendrick Locke Via telephone. Electronically Signed   By: Stevenson Elbe M.D.   On: 04/29/2024 23:08      Subjective: Seen and examined on the day of discharge.  Stable no distress.  [place] discharge home.  Discharge Exam: Vitals:   04/30/24 1330 04/30/24 1400  BP: 134/73 110/62  Pulse: 71 (!) 51  Resp: 17 14  Temp:    SpO2: 96% 99%   Vitals:   04/30/24 1156 04/30/24 1157 04/30/24 1330 04/30/24 1400  BP: 107/68  134/73  110/62  Pulse: (!) 57  71 (!) 51  Resp: 12  17 14   Temp:  97.6 F (36.4 C)    TempSrc:  Oral    SpO2: 97%  96% 99%  Weight:      Height:        General: Pt is alert, awake, not in acute distress Cardiovascular: RRR, S1/S2 +, no rubs, no gallops Respiratory: CTA bilaterally, no wheezing, no rhonchi Abdominal: Soft, NT, ND, bowel sounds + Extremities: no edema, no cyanosis    The results of significant diagnostics from this hospitalization (including imaging, microbiology, ancillary and laboratory) are listed below for reference.     Microbiology: No results found for this or any previous visit (from the past 240 hours).   Labs: BNP (last 3 results) No results for input(s): BNP in the last 8760 hours. Basic Metabolic Panel: Recent Labs  Lab 04/29/24 2323  NA 137  K 3.9  CL 103  CO2 29  GLUCOSE 138*  BUN 21  CREATININE 1.28*  CALCIUM  8.4*   Liver Function Tests: Recent Labs  Lab 04/29/24 2323  AST 17  ALT 16  ALKPHOS 53  BILITOT 0.8  PROT 6.1*  ALBUMIN 3.2*   No results for input(s): LIPASE, AMYLASE in the last 168 hours. No results for input(s): AMMONIA in the last 168 hours.  CBC: Recent Labs  Lab 04/29/24 2323  WBC 6.8  NEUTROABS 5.6  HGB 10.0*  HCT 30.4*  MCV 83.3  PLT 145*   Cardiac Enzymes: No results for input(s): CKTOTAL, CKMB, CKMBINDEX, TROPONINI in the last 168 hours. BNP: Invalid input(s): POCBNP CBG: Recent Labs  Lab 04/29/24 2307  GLUCAP 128*   D-Dimer No results for input(s): DDIMER in the last 72 hours. Hgb A1c Recent Labs    04/30/24 0539  HGBA1C 5.6   Lipid Profile Recent Labs    04/30/24 0539  CHOL 149  HDL 33*  LDLCALC 86  TRIG 244*  CHOLHDL 4.5   Thyroid  function studies No results for input(s): TSH, T4TOTAL, T3FREE, THYROIDAB in the last 72 hours.  Invalid input(s): FREET3 Anemia work up No results for input(s): VITAMINB12, FOLATE, FERRITIN, TIBC, IRON,  RETICCTPCT in the last 72 hours. Urinalysis    Component Value Date/Time   COLORURINE YELLOW (A) 02/14/2022 1038   APPEARANCEUR CLOUDY (A) 02/14/2022 1038   LABSPEC 1.016 02/14/2022 1038   PHURINE 5.0 02/14/2022 1038   GLUCOSEU NEGATIVE 02/14/2022 1038   HGBUR NEGATIVE 02/14/2022 1038   BILIRUBINUR NEGATIVE 02/14/2022 1038   BILIRUBINUR neg 01/16/2019 0928   KETONESUR NEGATIVE 02/14/2022 1038   PROTEINUR NEGATIVE 02/14/2022 1038   UROBILINOGEN 0.2 01/16/2019 0928   NITRITE NEGATIVE 02/14/2022 1038   LEUKOCYTESUR LARGE (A) 02/14/2022 1038   Sepsis Labs Recent Labs  Lab 04/29/24 2323  WBC 6.8   Microbiology No results found for this or any previous visit (from the past 240 hours).   Time coordinating discharge: Over 30 minutes  SIGNED:   Tiajuana Fluke, MD  [facility] 04/30/2024, 3:31 PM Pager   If 7PM-7AM, please contact night-coverage

## 2024-04-30 NOTE — Consult Note (Signed)
 [facility] TeleNeurology Consult Services   Patient Name:   Rebekah Perkins, Rebekah Perkins Date of Birth:   1956/04/20 Identification Number:   MRN - [id number] Date of Service:   04/29/2024 22:55:32  Diagnosis:       R42 - Dizziness/ Vertigo/ Giddiness  Impression:      Rebekah Perkins is a 68 yo F w/ a hx of OSA and hypothyroidism who presents with dizziness. On exam, has horizontal nystagmus prominent with left gaze. No focal deficits. Head CT showed no acute intracranial pathology. No LVO on CTA. Presentation most consistent with acute vestibular syndrome. Ddx includes acute vestibulopathy vs vestibular migraine vs posterior circulation ischemic stroke. No indication for thrombolytics given nondisabling deficits. Recommend the following:  - ASA 325 mg now  - Permissive HTN (SBP <200) until 2100  - Bedside swallow eval  - MRI brain w/o contrast  - Meclizine 25 mg q8 prn for dizziness  -Telemetry  -TTE  -hemoglobin A1c, lipid panel  - PT/OT  -Neurology to follow    Our recommendations are outlined below.  Recommendations:        Stroke/Telemetry Floor       Neuro Checks (Q4)       Bedside Swallow Eval       DVT Prophylaxis       IV Fluids, Normal Saline       Head of Bed 30 Degrees       Euglycemia and Avoid Hyperthermia (PRN Acetaminophen )  Sign Out:       Discussed with Emergency Department Provider    ------------------------------------------------------------------------------  Advanced Imaging: CTA Head and Neck Completed.  LVO:No  Patient is not a candidate for NIR   Metrics: Last Known Well: 04/29/2024 21:00:00 Dispatch Time: 04/29/2024 22:55:31 Arrival Time: 04/29/2024 22:22:00 Initial Response Time: 04/29/2024 22:56:26 Symptoms: dizziness. Initial patient interaction: 04/30/2024 23:02:00 NIHSS Assessment Completed: 04/30/2024 23:11:00 Patient is not a candidate for Thrombolytic. Thrombolytic Medical Decision: 04/29/2024 23:14:00 Patient was not deemed  candidate for Thrombolytic because of following reasons: Stroke severity too mild (non-disabling) .  CT Head: I personally reviewed all the CT images that were available to me and it showed: no acute intracranial pathology  Primary Provider Notified of Diagnostic Impression and Management Plan on: 04/30/2024 23:22:00    ------------------------------------------------------------------------------  History of Present Illness: Patient is a 68 year old Female.  Patient was brought by private transportation with symptoms of dizziness. Rebekah Perkins is a 68 yo F w/ a hx of OSA and hypothyroidism who presents with dizziness. LKN at 2100. Around that time, developed lightheadedness and vertigo as well as nausea. Denies any limb weakness or numbness. Denies any blurry vision or slurred speech.   Past Medical History: Other PMH:  hypothyroidism  Medications:  No Anticoagulant use  No Antiplatelet use Reviewed EMR for current medications  Allergies:  Reviewed  Social History: Smoking: No Alcohol Use: No Drug Use: No  Family History:  There is no family history of premature cerebrovascular disease pertinent to this consultation  ROS : 14 Points Review of Systems was performed and was negative except mentioned in HPI.  Past Surgical History: There Is No Surgical History Contributory To Today's Visit    Examination: BP(155/71), Pulse(76), 1A: Level of Consciousness - Alert; keenly responsive + 0 1B: Ask Month and Age - Both Questions Right + 0 1C: Blink Eyes & Squeeze Hands - Performs Both Tasks + 0 2: Test Horizontal Extraocular Movements - Normal + 0 3: Test Visual Fields - No Visual Loss + 0  4: Test Facial Palsy (Use Grimace if Obtunded) - Normal symmetry + 0 5A: Test Left Arm Motor Drift - No Drift for 10 Seconds + 0 5B: Test Right Arm Motor Drift - No Drift for 10 Seconds + 0 6A: Test Left Leg Motor Drift - No Drift for 5 Seconds + 0 6B: Test Right Leg Motor Drift  - No Drift for 5 Seconds + 0 7: Test Limb Ataxia (FNF/Heel-Shin) - No Ataxia + 0 8: Test Sensation - Normal; No sensory loss + 0 9: Test Language/Aphasia - Normal; No aphasia + 0 10: Test Dysarthria - Normal + 0 11: Test Extinction/Inattention - No abnormality + 0  NIHSS Score: 0   Pre-Morbid Modified Rankin Scale: 0 Points = No symptoms at all  Spoke with : ED provider  This consult was conducted in real time using interactive audio and Immunologist. Patient was informed of the technology being used for this visit and agreed to proceed. Patient located in hospital and provider located at home/office setting.   Patient is being evaluated for possible acute neurologic impairment and high probability of imminent or life-threatening deterioration. I spent total of 28 minutes providing care to this patient, including time for face to face visit via telemedicine, review of medical records, imaging studies and discussion of findings with providers, the patient and/or family.   Dr Mirna Amis   [facility] For Inpatient follow-up with [facility] physician please call [facility] at [phone]. As we are not an outpatient service for any post hospital discharge needs please contact the hospital for assistance. If you have any questions for the [facility] physicians or need to reconsult for clinical or diagnostic changes please contact us  via [facility] at [phone].

## 2024-04-30 NOTE — Progress Notes (Signed)
 SLP Cancellation Note  Patient Details Name: Rebekah Perkins MRN: [id number] DOB: 1955/12/21   Cancelled treatment:       Reason Eval/Treat Not Completed: SLP screened, no needs identified, will sign off (chart reviewed; consulted MD and met w/ pt/Family in room.)  Pt admitted to the ED w/ acute dizziness, vertigo, and vomiting complaints.Neurology followed reporting consistent w/ acute vestibular syndrome.  Pt denied any difficulty swallowing and is currently on a regular diet; tolerates swallowing pills w/ water per NSG. Pt conversed in Full conversation w/out overt expressive/receptive deficits noted; pt denied any speech-language deficits. Speech clear.  No further skilled ST services indicated as pt appears at her baseline. Pt agreed. NSG to reconsult if any change in status while admitted.     Darla Edward, MS, CCC-SLP Speech Language Pathologist Rehab Services; [facility] [phone] (ascom) Thurmond Hildebran 04/30/2024, 2:25 PM

## 2024-04-30 NOTE — ED Notes (Signed)
Informed RN bed assigned 

## 2024-04-30 NOTE — ED Notes (Signed)
 [facility] was called spoke with Jasmine

## 2024-04-30 NOTE — Assessment & Plan Note (Signed)
-   This is associated with vertigo. - It is highly suspicious for benign positional vertigo. - The patient will be admitted to an observation medical telemetry bed. - PT and OT consults will be obtained. - Will place the patient on as needed Antivert. -As needed antiemetics will be provided.

## 2024-04-30 NOTE — Progress Notes (Signed)
 Code stroke activated at 2254; patient in CT.  TS MD paged at 2255. Dr. Michalene Agee connected at 2257 and performed exam in CT, then ordered advanced imaging.  MRS 0.  TS RN disconnected as advanced imaging completed, but patient still in CT.

## 2024-04-30 NOTE — Assessment & Plan Note (Signed)
-   Will continue Synthroid.

## 2024-04-30 NOTE — Progress Notes (Signed)
 Anticoagulation monitoring(Lovenox):  68 yo  female ordered Lovenox 40 mg Q24h    Filed Weights   04/29/24 2238  Weight: 122.5 kg (270 lb)   BMI 47.8    Lab Results  Component Value Date   CREATININE 1.28 (H) 04/29/2024   CREATININE 1.09 (H) 02/14/2022   CREATININE 1.10 (H) 04/30/2021   Estimated Creatinine Clearance: 52.6 mL/min (A) (by C-G formula based on SCr of 1.28 mg/dL (H)). Hemoglobin & Hematocrit     Component Value Date/Time   HGB 10.0 (L) 04/29/2024 2323   HGB 11.6 (L) 02/09/2012 0944   HCT 30.4 (L) 04/29/2024 2323   HCT 37.2 02/09/2012 0944     Per Protocol for Patient with estCrcl > 30 ml/min and BMI > 30, will transition to Lovenox 62.6 mg Q24h.

## 2024-04-30 NOTE — Evaluation (Signed)
 Physical Therapy Evaluation Patient Details Name: TYNSLEE BOWLDS MRN: [id number] DOB: 1956/10/18 Today's Date: 04/30/2024  History of Present Illness  Jeny Nield is a 68yoF who presents to [facility] ED on 04/29/24 with acute onset dizziness, vertigo, vomiting. Pt seen by neurology who reports consistent with acute vestibular syndrome. Ddx includes acute vestibulopathy vs vestibular migraine vs posterior circulation ischemic stroke. PMH: HTN, obesity, GAD, OSA, PVD.  Clinical Impression  Pt demonstrates independent bed mobility, transfers, and AMB without any LOB, need for DME, and with full confidence. Pt reports confidence in safe mobility performance upon return to home. Pt denies any dizziness during session. No visual disturbance noted. Will sign off at this time.       If plan is discharge home, recommend the following: Assist for transportation   Can travel by private vehicle        Equipment Recommendations None recommended by PT  Recommendations for Other Services       Functional Status Assessment Patient has not had a recent decline in their functional status     Precautions / Restrictions Precautions Precautions: Fall Restrictions Weight Bearing Restrictions Per Provider Order: No      Mobility  Bed Mobility Overal bed mobility: Independent                  Transfers Overall transfer level: Independent                      Ambulation/Gait Ambulation/Gait assistance: Modified independent (Device/Increase time) Gait Distance (Feet): 521 Feet Assistive device: None Gait Pattern/deviations: WFL(Within Functional Limits)       General Gait Details: 0.59m/s  Stairs            Wheelchair Mobility     Tilt Bed    Modified Rankin (Stroke Patients Only)       Balance Overall balance assessment: Independent                                           Pertinent Vitals/Pain Pain Assessment Pain Assessment:  No/denies pain    Home Living Family/patient expects to be discharged to:: Private residence Living Arrangements: Alone;Children (son squatting with her after recent separation) Available Help at Discharge: Family Type of Home: House Home Access: Stairs to enter   Secretary/administrator of Steps: 2   Home Layout: One level   Additional Comments: variety of DME from her mother    Prior Function Prior Level of Function : Independent/Modified Independent;Driving             Mobility Comments: some pain with max distance walking from knee DJD       Extremity/Trunk Assessment                Communication        Cognition                                         Cueing       General Comments      Exercises     Assessment/Plan    PT Assessment Patient does not need any further PT services  PT Problem List         PT Treatment Interventions      PT Goals (Current goals can  be found in the Care Plan section)  Acute Rehab PT Goals PT Goal Formulation: All assessment and education complete, DC therapy    Frequency       Co-evaluation               AM-PAC PT 6 Clicks Mobility  Outcome Measure Help needed turning from your back to your side while in a flat bed without using bedrails?: None Help needed moving from lying on your back to sitting on the side of a flat bed without using bedrails?: None Help needed moving to and from a bed to a chair (including a wheelchair)?: None Help needed standing up from a chair using your arms (e.g., wheelchair or bedside chair)?: None Help needed to walk in hospital room?: None Help needed climbing 3-5 steps with a railing? : None 6 Click Score: 24    End of Session   Activity Tolerance: Patient tolerated treatment well;No increased pain Patient left: in bed;with call bell/phone within reach   PT Visit Diagnosis: Dizziness and giddiness (R42)    Time: 1324-4010 PT Time Calculation  (min) (ACUTE ONLY): 9 min   Charges:   PT Evaluation $PT Eval Low Complexity: 1 Low   PT General Charges $$ ACUTE PT VISIT: 1 Visit        9:14 AM, 04/30/24 Dawn Eth, PT, DPT Physical Therapist - [facility]  [phone] (ASCOM)    Math Brazie C 04/30/2024, 9:13 AM

## 2024-05-02 ENCOUNTER — Inpatient Hospital Stay: Attending: Obstetrics and Gynecology | Admitting: Nurse Practitioner

## 2024-05-07 ENCOUNTER — Inpatient Hospital Stay: Attending: Obstetrics and Gynecology | Admitting: Nurse Practitioner

## 2024-05-07 ENCOUNTER — Encounter: Payer: Self-pay | Admitting: Nurse Practitioner

## 2024-05-20 ENCOUNTER — Other Ambulatory Visit: Payer: Self-pay

## 2024-05-20 ENCOUNTER — Inpatient Hospital Stay: Attending: Obstetrics and Gynecology | Admitting: Nurse Practitioner

## 2024-05-20 ENCOUNTER — Encounter: Payer: Self-pay | Admitting: Nurse Practitioner

## 2024-05-20 VITALS — BP 127/68 | HR 65 | Temp 97.0°F | Resp 18 | Ht 63.0 in | Wt 268.0 lb

## 2024-05-20 DIAGNOSIS — R1900 Intra-abdominal and pelvic swelling, mass and lump, unspecified site: Secondary | ICD-10-CM | POA: Insufficient documentation

## 2024-05-20 DIAGNOSIS — Z79899 Other long term (current) drug therapy: Secondary | ICD-10-CM | POA: Diagnosis not present

## 2024-05-20 DIAGNOSIS — Z8544 Personal history of malignant neoplasm of other female genital organs: Secondary | ICD-10-CM | POA: Diagnosis present

## 2024-05-20 DIAGNOSIS — N900 Mild vulvar dysplasia: Secondary | ICD-10-CM | POA: Diagnosis not present

## 2024-05-20 DIAGNOSIS — R8769 Abnormal cytological findings in specimens from other female genital organs: Secondary | ICD-10-CM | POA: Diagnosis not present

## 2024-05-20 DIAGNOSIS — Z923 Personal history of irradiation: Secondary | ICD-10-CM | POA: Insufficient documentation

## 2024-05-20 DIAGNOSIS — Z08 Encounter for follow-up examination after completed treatment for malignant neoplasm: Secondary | ICD-10-CM | POA: Diagnosis present

## 2024-05-20 NOTE — Progress Notes (Unsigned)
 Gynecologic Oncology Interval Visit   Referring Provider: Dr Janit  Chief Concern: vulvar cancer, s/p radiation  Subjective:  Rebekah Perkins is a 68 y.o. female who is seen in consultation from Dr. Janit for VIN3 and invasive vulvar cancer, medically inoperable (cT1bN0), stage IB poorly differentiated invasive squamous cell carcinoma s/p radiation and boost with concurrent cisplatin, at [facility] with Dr. Lurline and Dr. Mancil, completed 05/27/22, with negative PET post completion, found to have vulvar erythema white epithelial areas with biopsy confirmed VIN1. Surgery vs topical aldara  were discussed and she elected for aldara . Started Aldara  M-W-F around May 1st. She returns to clinic for recheck.   She was seen 2 weeks ago. Had widespread irritation at that time. She is using a long cotton swab for application which helps. Using vaseline to non-treatment surfaces. She has been using aldara  for approximately 3.5 weeks. She's having worsening redness and pain. She had to wash cream off last night d/t symptoms.    Gyn Oncology History She presented to Dr Janit for her annual examination 02/04/22.  She states that she was doing well since last year but in February she had 3 to 4 days of vaginal bleeding almost like a period.  Then again early in March she had 1 day of bleeding.  She says she feels like something is tearing down there. She continues to use estrogen vaginal cream every other day.  Vulvar exam shows posterior fourchette with large apparently kissing lesion.  Very friable.  Erythematous.   Biopsy of vulva: At least intraepithelial squamous cell carcinoma (Carcinoma in-situ / VIN III). Invasive carcinoma cannot be excluded in the submitted biopsy due to superficial nature of the biopsy.   Seen by Gyn Oncology  02/16/22 VULVA, LEFT; BIOPSY: INVASIVE SQUAMOUS CELL CARCINOMA, P16 POSITIVE.  Sections demonstrate an invasive poorly differentiated malignant neoplasm that focally undermines  intact squamous mucosa. A limited panel of immunohistochemical stains was performed. The malignant neoplasm is positive for pancytokeratin, p63, and p16 (diffuse, block-like). This pattern of immunoreactivity supports the above diagnosis.   PET/CT 03/18/22 1. Focal uptake at the left posterior peritoneum/multiple compatible with known primary malignancy.  2. No evidence of metastatic disease.  3. Mildly enlarged thyroid  gland with diffusely increased uptake suggestive of Graves' disease or thyroiditis. Correlate clinically.   In view of diffuse involvement of vulva with VIN and invasive cancer decision made to treat with radiation.   Treated by Dr Lurline at [facility], IMRT 10X 5040cGy 48 180cGy 03/30/22-05/16/22  Vulva boost, IMRT 10X 1600cGy 10 200cGy 05/18/22-05/27/22 Total dose 6640cGy 59   08/31/22- PET  IMPRESSION:  1. Resolved hypermetabolic activity in the left vulva.  2.  No evidence of FDG avid metastatic disease.  3.  Findings suggestive of thyroiditis   Last pap 02/07/23- ASCUS HR HPV Negative. No history of abnormal pap previously.   She had a Biopsy on 01/17/2024 to assess vulvar irritation and area on exam notable for erythema and leukoplakia on the right vulva.    1. Vulva, biopsy, right, lesion :       - LOW-GRADE SQUAMOUS INTRAEPITHELIAL LESION (LSIL/VIN-1).  She had a biopsy check on 01/25/2024 and noted that biopsy site is healing well.    She was also started on vaginal dilator therapy daily x 10 minutes, but is having difficulty with that, and on 01/25/2024 she started vaginal estrogen therapy 2-3 nights a week.   Problem List: Patient Active Problem List   Diagnosis Date Noted   Hypothyroidism 04/30/2024  Essential hypertension 04/30/2024   Dizziness 04/29/2024   Vulvar low-grade squamous intraepithelial lesion (LGSIL) 01/25/2024   Encounter for follow-up surveillance of vulvar cancer 01/17/2024   Vulvar lesion 01/17/2024   Vaginal dryness 01/17/2024   Vulvar  cancer, carcinoma (HCC) 02/15/2022   Screening for colon cancer    Chronic GERD    BMI 45.0-49.9, adult (HCC) 12/03/2019   Endometrial hyperplasia without atypia, simple 07/21/2015   Diverticulosis 07/21/2015   Neuritis or radiculitis due to rupture of lumbar intervertebral disc 11/12/2014   Lumbar canal stenosis 11/12/2014   Bursitis, trochanteric 11/12/2014   Back ache 04/10/2014   Chronic anemia 04/10/2014   BP (high blood pressure) 04/10/2014   Blood glucose elevated 04/10/2014   HLD (hyperlipidemia) 04/10/2014   Adult hypothyroidism 04/10/2014   Adiposity 04/10/2014   Obstructive apnea 04/10/2014    Past Medical History: Past Medical History:  Diagnosis Date   Anxiety    Apnea    Complication of anesthesia    slow to wake up afterwards   Diverticulitis    Environmental allergies    Hypertension    Menopausal state    Obesity    PMB (postmenopausal bleeding)    PVD (peripheral vascular disease) (HCC)    Rosacea    Simple endometrial hyperplasia    Sleep apnea    Thyroid  disease    Urinary urgency     Past Surgical History: Past Surgical History:  Procedure Laterality Date   CHOLECYSTECTOMY     COLONOSCOPY WITH PROPOFOL  N/A 05/05/2021   Procedure: COLONOSCOPY WITH PROPOFOL ;  Surgeon: Unk Corinn Skiff, MD;  Location: [facility] ENDOSCOPY;  Service: Gastroenterology;  Laterality: N/A;   DILATION AND CURETTAGE OF UTERUS     ESOPHAGOGASTRODUODENOSCOPY (EGD) WITH PROPOFOL  N/A 02/03/2021   Procedure: ESOPHAGOGASTRODUODENOSCOPY (EGD) WITH PROPOFOL ;  Surgeon: Unk Corinn Skiff, MD;  Location: [facility] ENDOSCOPY;  Service: Gastroenterology;  Laterality: N/A;   HYSTEROSCOPY      OB History:  OB History  Gravida Para Term Preterm AB Living  3 3 3   3   SAB IAB Ectopic Multiple Live Births      3    # Outcome Date GA Lbr Len/2nd Weight Sex Type Anes PTL Lv  3 Term 1978   7 lb 1.6 oz (3.221 kg) M Vag-Spont   LIV  2 Term 1977   7 lb (3.175 kg) M Vag-Spont   LIV  1 Term 1975    7 lb 1.9 oz (3.23 kg) M Vag-Spont   LIV    Family History: Family History  Problem Relation Age of Onset   Diabetes Mother    Heart disease Mother    Diabetes Sister    Breast cancer Sister    Cancer Sister    Breast cancer Maternal Aunt 60   Lung cancer Maternal Aunt    Cancer Maternal Grandmother    Colon cancer Maternal Grandmother    Breast cancer Maternal Grandmother 12   Ovarian cancer Neg Hx     Social History: Social History   Socioeconomic History   Marital status: Divorced    Spouse name: Not on file   Number of children: Not on file   Years of education: Not on file   Highest education level: Not on file  Occupational History   Not on file  Tobacco Use   Smoking status: Never    Passive exposure: Yes   Smokeless tobacco: Never  Vaping Use   Vaping status: Never Used  Substance and Sexual Activity   Alcohol  use: No   Drug use: No   Sexual activity: Not Currently  Other Topics Concern   Not on file  Social History Narrative   Lives alone at home.    Social Drivers of Health   Financial Resource Strain: Medium Risk (04/13/2024)   Received from [facility]   Overall Financial Resource Strain (CARDIA)    Difficulty of Paying Living Expenses: Somewhat hard  Food Insecurity: Food Insecurity Present (04/13/2024)   Received from [facility]   Hunger Vital Sign    Within the past 12 months, you worried that your food would run out before you got the money to buy more.: Sometimes true    Within the past 12 months, the food you bought just didn't last and you didn't have money to get more.: Sometimes true  Transportation Needs: No Transportation Needs (04/13/2024)   Received from [facility] - Transportation    In the past 12 months, has lack of transportation kept you from medical appointments or from getting medications?: No    Lack of Transportation (Non-Medical): No  Physical Activity:  Inactive (01/11/2018)   Exercise Vital Sign    Days of Exercise per Week: 0 days    Minutes of Exercise per Session: 0 min  Stress: Not on file  Social Connections: Not on file  Intimate Partner Violence: Not on file    Allergies: Allergies  Allergen Reactions   Azithromycin Itching   Penicillins Itching    Current Medications: Current Outpatient Medications  Medication Sig Dispense Refill   aspirin  EC 81 MG tablet Take 81 mg by mouth daily.     azelastine  (ASTELIN ) 0.1 % nasal spray Place 2 sprays into both nostrils 2 (two) times daily. For seasonal allergies 30 mL 3   bisoprolol -hydrochlorothiazide  (ZIAC ) 5-6.25 MG tablet Take 1 tablet by mouth once daily 90 tablet 1   calcium  carbonate (OS-CAL) 600 MG TABS tablet Take 600 mg by mouth at bedtime.     celecoxib  (CELEBREX ) 200 MG capsule Take 1 capsule (200 mg total) by mouth 2 (two) times daily 180 capsule 1   cetirizine (ZYRTEC) 10 MG tablet Take 10 mg by mouth at bedtime.     docusate sodium  (COLACE) 100 MG capsule Take 100 mg by mouth 2 (two) times daily.     estradiol  (ESTRACE ) 0.1 MG/GM vaginal cream Place 0.25 Applicatorfuls vaginally 3 (three) times a week. 43 g 2   gabapentin  (NEURONTIN ) 300 MG capsule Take 1 capsule by mouth nightly 90 capsule 1   imiquimod  (ALDARA ) 5 % cream Apply pea sized amount to right vulvar lesion on Monday-Wednesday-Friday at bedtime. Wash off in morning. Apply vaseline to non-treatment skin. 12 each 0   isosorbide  mononitrate (IMDUR ) 30 MG 24 hr tablet TAKE 1 TABLET BY MOUTH DAILY 90 tablet 3   levothyroxine  (SYNTHROID ) 150 MCG tablet Take 1 tablet (150 mcg total) by mouth once daily. Take on an empty stomach with a glass of water at least 30-60 minutes before breakfast. 90 tablet 1   magnesium  oxide (MAG-OX) 400 MG tablet Take 1 tablet by mouth 2 (two) times daily.     Multiple Vitamins-Minerals (PRESERVISION AREDS 2+MULTI VIT PO) Take 1 capsule by mouth in the morning and at bedtime.      pantoprazole  (PROTONIX ) 40 MG tablet Take 40 mg by mouth 2 (two) times daily.     topiramate  (TOPAMAX ) 50 MG tablet Take 1 tablet by mouth at bedtime.  vitamin B-12 (CYANOCOBALAMIN ) 1000 MCG tablet Take 1,000 mcg by mouth daily.     meclizine  (ANTIVERT ) 25 MG tablet Take 1 tablet (25 mg total) by mouth 3 (three) times daily as needed for dizziness. (Patient not taking: Reported on 05/20/2024) 30 tablet 0   No current facility-administered medications for this visit.   Objective:  Physical Examination:  BP 127/68   Pulse 65   Temp (!) 97 F (36.1 C) (Tympanic)   Resp 18   Ht 5' 3 (1.6 m)   Wt 268 lb (121.6 kg)   BMI 47.47 kg/m    ECOG Performance Status: 0 - Asymptomatic  GENERAL: Patient is a well appearing female in no acute distress NEURO:  Nonfocal. Well oriented.  Appropriate affect.  Pelvic: exam chaperoned by CMA.  Vulva: erythema of labia majora has improved. Erythematous lesion of right labia minora at treatment site has become more erythematous and areas of ulceration. Left labia minor extending into vaginal introitus is erythematous and scattered ulcerations. Tender. Remainder of exam deferred. We used a mirror to team area of treatment and areas for vaseline.    Lab Review:  No labs on site today  Imaging Review: No imaging on site today   Assessment:  ARLOA PRAK is a 68 y.o. female diagnosed with large area of VIN3 and squamous cell cancer of the vulva 4/23.  PET/CT negative for metastatic disease. Completed vulvar radiation 7/23. Follow up PET was negative in October 2023. NED since. No definitive evidence of cancer on exam.    Symptomatic vulvar erythema white epithelial areas with biopsy confirmed VIN1. Stated Aldara  3 times a week around May 1st. Complicated by skin reaction.   Vaginal dryness and agglutination- on intravaginal estradiol .   Abdominal mass- benign appearing  ASCUS HPV negative  Medical co-morbidities complicating care: Morbid  obesity, Sleep apnea, HTN.  Plan:   Problem List Items Addressed This Visit   None    She's now completed 3.5 weeks of treatment. Given local skin reaction, recommend holding x 1 week. Reviewed strategies for application and use of vaseline to non treatment areas. Use a smaller volume of cream (currently using 1 packet- estimate needing pea sized volume. Plan to resume treatment once skin reaction improves then 3 times a week. Will see her back in 3 weeks for recheck. Plan to recheck area in 18-20 weeks for reevaluation & biopsy.   Abdominal mass- benign appearing. Possible lipoma? Monitor. If enlarging or changing, notify clinic for reevaluation, possible ultrasound.   Continue vaginal dilator therapy daily every 10 minutes as directed on the package insert and vaginal estrogen therapy 2-3 times per week.   Repeat Pap smear in March 2026  Tinnie Dawn, DNP, AGNP-C, [facility] at [facility] [place] [phone] (clinic)

## 2024-06-10 ENCOUNTER — Inpatient Hospital Stay: Admitting: Nurse Practitioner

## 2024-06-10 ENCOUNTER — Encounter: Payer: Self-pay | Admitting: Nurse Practitioner

## 2024-06-10 VITALS — BP 119/75 | HR 62 | Temp 98.1°F | Resp 18 | Ht 63.0 in | Wt 271.0 lb

## 2024-06-10 DIAGNOSIS — R8769 Abnormal cytological findings in specimens from other female genital organs: Secondary | ICD-10-CM

## 2024-06-10 DIAGNOSIS — Z08 Encounter for follow-up examination after completed treatment for malignant neoplasm: Secondary | ICD-10-CM | POA: Diagnosis not present

## 2024-06-10 NOTE — Progress Notes (Signed)
 Patient is still using her cream, and it is still burning, she uses dentine to help with the burning.

## 2024-06-10 NOTE — Progress Notes (Signed)
 Gynecologic Oncology Interval Visit   Referring Provider: Dr Janit  Chief Concern: VIN 1 - aldara  follow up. Hx of Vulvar cancer s/p radiation  Subjective:  Rebekah Perkins is a 68 y.o. female who is seen in consultation from Dr. Janit for VIN3 and invasive vulvar cancer, medically inoperable (cT1bN0), stage IB poorly differentiated invasive squamous cell carcinoma s/p radiation and boost with concurrent cisplatin, at [facility] with Dr. Lurline and Dr. Mancil, completed 05/27/22, with negative PET post completion, found to have vulvar erythema white epithelial areas with biopsy confirmed VIN1. Surgery vs topical aldara  were discussed and she elected for aldara . Started Aldara  M-W-F around May 1st. Treatment was complicated due to erythema and pain. She was seen on 7/7. Erythema had improved. Ulceration had improved. Tolerating well. Denies complaints today.     Gyn Oncology History She presented to Dr Janit for her annual examination 02/04/22.  She states that she was doing well since last year but in February she had 3 to 4 days of vaginal bleeding almost like a period.  Then again early in March she had 1 day of bleeding.  She says she feels like something is tearing down there. She continues to use estrogen vaginal cream every other day.  Vulvar exam shows posterior fourchette with large apparently kissing lesion.  Very friable.  Erythematous.   Biopsy of vulva: At least intraepithelial squamous cell carcinoma (Carcinoma in-situ / VIN III). Invasive carcinoma cannot be excluded in the submitted biopsy due to superficial nature of the biopsy.   Seen by Gyn Oncology  02/16/22 VULVA, LEFT; BIOPSY: INVASIVE SQUAMOUS CELL CARCINOMA, P16 POSITIVE.  Sections demonstrate an invasive poorly differentiated malignant neoplasm that focally undermines intact squamous mucosa. A limited panel of immunohistochemical stains was performed. The malignant neoplasm is positive for pancytokeratin, p63, and p16 (diffuse,  block-like). This pattern of immunoreactivity supports the above diagnosis.   PET/CT 03/18/22 1. Focal uptake at the left posterior peritoneum/multiple compatible with known primary malignancy.  2. No evidence of metastatic disease.  3. Mildly enlarged thyroid  gland with diffusely increased uptake suggestive of Graves' disease or thyroiditis. Correlate clinically.   In view of diffuse involvement of vulva with VIN and invasive cancer decision made to treat with radiation.   Treated [facility] Dr Lurline at [facility], IMRT 10X 5040cGy 48 180cGy 03/30/22-05/16/22  Vulva boost, IMRT 10X 1600cGy 10 200cGy 05/18/22-05/27/22 Total dose 6640cGy 59   08/31/22- PET  IMPRESSION:  1. Resolved hypermetabolic activity in the left vulva.  2.  No evidence of FDG avid metastatic disease.  3.  Findings suggestive of thyroiditis   Last pap 02/07/23- ASCUS HR HPV Negative. No history of abnormal pap previously.   She had a Biopsy on 01/17/2024 to assess vulvar irritation and area on exam notable for erythema and leukoplakia on the right vulva.    1. Vulva, biopsy, right, lesion :       - LOW-GRADE SQUAMOUS INTRAEPITHELIAL LESION (LSIL/VIN-1).  She had a biopsy check on 01/25/2024 and noted that biopsy site is healing well.    She was also started on vaginal dilator therapy daily x 10 minutes, but is having difficulty with that, and on 01/25/2024 she started vaginal estrogen therapy 2-3 nights a week.   Problem List: Patient Active Problem List   Diagnosis Date Noted   Hypothyroidism 04/30/2024   Essential hypertension 04/30/2024   Dizziness 04/29/2024   Vulvar low-grade squamous intraepithelial lesion (LGSIL) 01/25/2024   Encounter for follow-up surveillance of vulvar cancer 01/17/2024  Vulvar lesion 01/17/2024   Vaginal dryness 01/17/2024   Vulvar cancer, carcinoma (HCC) 02/15/2022   Screening for colon cancer    Chronic GERD    BMI 45.0-49.9, adult (HCC) 12/03/2019   Endometrial hyperplasia without atypia,  simple 07/21/2015   Diverticulosis 07/21/2015   Neuritis or radiculitis due to rupture of lumbar intervertebral disc 11/12/2014   Lumbar canal stenosis 11/12/2014   Bursitis, trochanteric 11/12/2014   Back ache 04/10/2014   Chronic anemia 04/10/2014   BP (high blood pressure) 04/10/2014   Blood glucose elevated 04/10/2014   HLD (hyperlipidemia) 04/10/2014   Adult hypothyroidism 04/10/2014   Adiposity 04/10/2014   Obstructive apnea 04/10/2014   Past Medical History: Past Medical History:  Diagnosis Date   Anxiety    Apnea    Complication of anesthesia    slow to wake up afterwards   Diverticulitis    Environmental allergies    Hypertension    Menopausal state    Obesity    PMB (postmenopausal bleeding)    PVD (peripheral vascular disease) (HCC)    Rosacea    Simple endometrial hyperplasia    Sleep apnea    Thyroid  disease    Urinary urgency    Past Surgical History: Past Surgical History:  Procedure Laterality Date   CHOLECYSTECTOMY     COLONOSCOPY WITH PROPOFOL  N/A 05/05/2021   Procedure: COLONOSCOPY WITH PROPOFOL ;  Surgeon: Unk Corinn Skiff, MD;  Location: [facility] ENDOSCOPY;  Service: Gastroenterology;  Laterality: N/A;   DILATION AND CURETTAGE OF UTERUS     ESOPHAGOGASTRODUODENOSCOPY (EGD) WITH PROPOFOL  N/A 02/03/2021   Procedure: ESOPHAGOGASTRODUODENOSCOPY (EGD) WITH PROPOFOL ;  Surgeon: Unk Corinn Skiff, MD;  Location: [facility] ENDOSCOPY;  Service: Gastroenterology;  Laterality: N/A;   HYSTEROSCOPY     OB History:  OB History  Gravida Para Term Preterm AB Living  3 3 3   3   SAB IAB Ectopic Multiple Live Births      3    # Outcome Date GA Lbr Len/2nd Weight Sex Type Anes PTL Lv  3 Term 1978   7 lb 1.6 oz (3.221 kg) M Vag-Spont   LIV  2 Term 1977   7 lb (3.175 kg) M Vag-Spont   LIV  1 Term 1975   7 lb 1.9 oz (3.23 kg) M Vag-Spont   LIV   Family History: Family History  Problem Relation Age of Onset   Diabetes Mother    Heart disease Mother    Diabetes  Sister    Breast cancer Sister    Cancer Sister    Breast cancer Maternal Aunt 60   Lung cancer Maternal Aunt    Cancer Maternal Grandmother    Colon cancer Maternal Grandmother    Breast cancer Maternal Grandmother 43   Ovarian cancer Neg Hx    Social History: Social History   Socioeconomic History   Marital status: Divorced    Spouse name: Not on file   Number of children: Not on file   Years of education: Not on file   Highest education level: Not on file  Occupational History   Not on file  Tobacco Use   Smoking status: Never    Passive exposure: Yes   Smokeless tobacco: Never  Vaping Use   Vaping status: Never Used  Substance and Sexual Activity   Alcohol use: No   Drug use: No   Sexual activity: Not Currently  Other Topics Concern   Not on file  Social History Narrative   Lives alone at home.  Social Drivers of Health   Financial Resource Strain: Medium Risk (04/13/2024)   Received from [facility]   Overall Financial Resource Strain (CARDIA)    Difficulty of Paying Living Expenses: Somewhat hard  Food Insecurity: Food Insecurity Present (04/13/2024)   Received from [facility]   Hunger Vital Sign    Within the past 12 months, you worried that your food would run out before you got the money to buy more.: Sometimes true    Within the past 12 months, the food you bought just didn't last and you didn't have money to get more.: Sometimes true  Transportation Needs: No Transportation Needs (04/13/2024)   Received from [facility] - Transportation    In the past 12 months, has lack of transportation kept you from medical appointments or from getting medications?: No    Lack of Transportation (Non-Medical): No  Physical Activity: Inactive (01/11/2018)   Exercise Vital Sign    Days of Exercise per Week: 0 days    Minutes of Exercise per Session: 0 min  Stress: Not on file  Social Connections: Not on  file  Intimate Partner Violence: Not on file   Allergies: Allergies  Allergen Reactions   Azithromycin Itching   Penicillins Itching   Current Medications: Current Outpatient Medications  Medication Sig Dispense Refill   aspirin  EC 81 MG tablet Take 81 mg by mouth daily.     azelastine  (ASTELIN ) 0.1 % nasal spray Place 2 sprays into both nostrils 2 (two) times daily. For seasonal allergies 30 mL 3   bisoprolol -hydrochlorothiazide  (ZIAC ) 5-6.25 MG tablet Take 1 tablet by mouth once daily 90 tablet 1   calcium  carbonate (OS-CAL) 600 MG TABS tablet Take 600 mg by mouth at bedtime.     celecoxib  (CELEBREX ) 200 MG capsule Take 1 capsule (200 mg total) by mouth 2 (two) times daily 180 capsule 1   cetirizine (ZYRTEC) 10 MG tablet Take 10 mg by mouth at bedtime.     docusate sodium  (COLACE) 100 MG capsule Take 100 mg by mouth 2 (two) times daily.     estradiol  (ESTRACE ) 0.1 MG/GM vaginal cream Place 0.25 Applicatorfuls vaginally 3 (three) times a week. 43 g 2   gabapentin  (NEURONTIN ) 300 MG capsule Take 1 capsule by mouth nightly 90 capsule 1   imiquimod  (ALDARA ) 5 % cream Apply pea sized amount to right vulvar lesion on Monday-Wednesday-Friday at bedtime. Wash off in morning. Apply vaseline to non-treatment skin. 12 each 0   isosorbide  mononitrate (IMDUR ) 30 MG 24 hr tablet TAKE 1 TABLET BY MOUTH DAILY 90 tablet 3   levothyroxine  (SYNTHROID ) 150 MCG tablet Take 1 tablet (150 mcg total) by mouth once daily. Take on an empty stomach with a glass of water at least 30-60 minutes before breakfast. 90 tablet 1   magnesium  oxide (MAG-OX) 400 MG tablet Take 1 tablet by mouth 2 (two) times daily.     meclizine  (ANTIVERT ) 25 MG tablet Take 1 tablet (25 mg total) by mouth 3 (three) times daily as needed for dizziness. 30 tablet 0   Multiple Vitamins-Minerals (PRESERVISION AREDS 2+MULTI VIT PO) Take 1 capsule by mouth in the morning and at bedtime.     pantoprazole  (PROTONIX ) 40 MG tablet Take 40 mg by mouth  2 (two) times daily.     topiramate  (TOPAMAX ) 50 MG tablet Take 1 tablet by mouth at bedtime.     vitamin B-12 (CYANOCOBALAMIN ) 1000 MCG tablet Take 1,000 mcg  by mouth daily.     No current facility-administered medications for this visit.   Objective:  Physical Examination:  BP 119/75 (BP Location: Right Arm, Patient Position: Sitting, Cuff Size: Large)   Pulse 62   Temp 98.1 F (36.7 C) (Tympanic)   Resp 18   Ht 5' 3 (1.6 m)   Wt 271 lb (122.9 kg)   SpO2 99%   BMI 48.01 kg/m    ECOG Performance Status: 0 - Asymptomatic  GENERAL: Patient is a well appearing female in no acute distress NEURO:  Nonfocal. Well oriented.  Appropriate affect.  Pelvic: exam chaperoned by CMA.  Vulva: erythema of labia majora has resolved. Chronic hyperpigmentation likely related to previous radiation. Ulceration of left labia minor has resolved. No ulcerations. Nontender.   Lab Review:  No labs on site today  Imaging Review: No imaging on site today   Assessment:  ANNER BAITY is a 68 y.o. female diagnosed with large area of VIN3 and squamous cell cancer of the vulva 4/23.  PET/CT negative for metastatic disease. Completed vulvar radiation 7/23. Follow up PET was negative in October 2023. NED since. No definitive evidence of cancer on exam.    Symptomatic vulvar erythema white epithelial areas with biopsy confirmed VIN1. Stated Aldara  3 times a week around May 1st. Complicated by skin reaction. Held x 2 weeks. Now restarted but only tolerating twice a week. Again advised to try to continue holding for no more than 3 days.   Vaginal dryness and agglutination- on intravaginal estradiol - held d/t difficulty with multiple creams, discomfort.   Abdominal mass- benign appearing  ASCUS HPV negative  Medical co-morbidities complicating care: Morbid obesity, Sleep apnea, HTN.  Plan:   Problem List Items Addressed This Visit       Genitourinary   Vulvar low-grade squamous intraepithelial  lesion (LGSIL) - Primary   She's now completed approximately 9 weeks of treatment. She is having to take some treatment breaks and reduced frequency d/t intolerance. Try for 3 days a week and if intolerable side effects then hold until symptoms resolve, ideally < 3 days then resume. Plan is complete 16 weeks of treatment then allow for 4-6 weeks for recovery prior to returning to clinic for reevaluation and biopsy. She will notify clinic if she needs to take additional breaks so that we can adjust her follow up appointments.   Abdominal mass- benign appearing. Possible lipoma? Monitor. If enlarging or changing, notify clinic for reevaluation, possible ultrasound.   Continue vaginal dilator therapy daily every 10 minutes as directed on the package insert and vaginal estrogen therapy 2-3 times per week. On hold d/t patient preference.   Repeat Pap smear in March 2026  Disposition:  Push 9/10 gyn onc appt to mid-October for vin3 follow up- la  Tinnie Dawn, DNP, AGNP-C, AOCNP [facility] [phone] (clinic)

## 2024-07-18 ENCOUNTER — Other Ambulatory Visit: Payer: Self-pay | Admitting: Internal Medicine

## 2024-07-18 DIAGNOSIS — Z1231 Encounter for screening mammogram for malignant neoplasm of breast: Secondary | ICD-10-CM

## 2024-07-24 ENCOUNTER — Ambulatory Visit

## 2024-08-20 ENCOUNTER — Other Ambulatory Visit: Payer: Self-pay | Admitting: *Deleted

## 2024-08-20 DIAGNOSIS — R8769 Abnormal cytological findings in specimens from other female genital organs: Secondary | ICD-10-CM

## 2024-09-11 ENCOUNTER — Inpatient Hospital Stay

## 2024-09-11 ENCOUNTER — Encounter: Payer: Self-pay | Admitting: Obstetrics and Gynecology

## 2024-09-11 ENCOUNTER — Inpatient Hospital Stay: Attending: Obstetrics and Gynecology | Admitting: Obstetrics and Gynecology

## 2024-09-11 ENCOUNTER — Inpatient Hospital Stay (HOSPITAL_BASED_OUTPATIENT_CLINIC_OR_DEPARTMENT_OTHER): Admitting: Nurse Practitioner

## 2024-09-11 VITALS — BP 129/81 | HR 53 | Temp 98.6°F | Resp 17 | Wt 277.4 lb

## 2024-09-11 DIAGNOSIS — Z923 Personal history of irradiation: Secondary | ICD-10-CM | POA: Diagnosis not present

## 2024-09-11 DIAGNOSIS — R112 Nausea with vomiting, unspecified: Secondary | ICD-10-CM | POA: Diagnosis not present

## 2024-09-11 DIAGNOSIS — Z8544 Personal history of malignant neoplasm of other female genital organs: Secondary | ICD-10-CM | POA: Diagnosis present

## 2024-09-11 DIAGNOSIS — Z9221 Personal history of antineoplastic chemotherapy: Secondary | ICD-10-CM | POA: Insufficient documentation

## 2024-09-11 DIAGNOSIS — R8769 Abnormal cytological findings in specimens from other female genital organs: Secondary | ICD-10-CM | POA: Diagnosis not present

## 2024-09-11 DIAGNOSIS — R739 Hyperglycemia, unspecified: Secondary | ICD-10-CM | POA: Diagnosis not present

## 2024-09-11 DIAGNOSIS — L089 Local infection of the skin and subcutaneous tissue, unspecified: Secondary | ICD-10-CM | POA: Diagnosis not present

## 2024-09-11 DIAGNOSIS — B3731 Acute candidiasis of vulva and vagina: Secondary | ICD-10-CM

## 2024-09-11 DIAGNOSIS — E8881 Metabolic syndrome: Secondary | ICD-10-CM | POA: Diagnosis not present

## 2024-09-11 DIAGNOSIS — Z08 Encounter for follow-up examination after completed treatment for malignant neoplasm: Secondary | ICD-10-CM | POA: Insufficient documentation

## 2024-09-11 DIAGNOSIS — C519 Malignant neoplasm of vulva, unspecified: Secondary | ICD-10-CM | POA: Diagnosis not present

## 2024-09-11 DIAGNOSIS — R42 Dizziness and giddiness: Secondary | ICD-10-CM | POA: Diagnosis not present

## 2024-09-11 LAB — HEMOGLOBIN A1C
Hgb A1c MFr Bld: 5 % (ref 4.8–5.6)
Mean Plasma Glucose: 96.8 mg/dL

## 2024-09-11 MED ORDER — MICONAZOLE NITRATE 2 % EX CREA: 1.0000 | TOPICAL_CREAM | Freq: Two times a day (BID) | CUTANEOUS | 1 refills | Status: AC

## 2024-09-11 MED ORDER — METFORMIN HCL 500 MG PO TABS: 500.0000 mg | ORAL_TABLET | Freq: Every day | ORAL | 1 refills | Status: AC

## 2024-09-11 MED ORDER — FLUCONAZOLE 150 MG PO TABS: ORAL_TABLET | ORAL | 0 refills | Status: DC

## 2024-09-11 NOTE — Progress Notes (Signed)
 Gynecologic Oncology Interval Visit   Referring Provider: Dr Janit  Chief Concern: VIN 1 - aldara  follow up. Hx of Vulvar cancer s/p radiation  Subjective:  Rebekah Perkins is a 68 y.o. female who is seen in consultation from Dr. Janit for VIN3 and invasive vulvar cancer, medically inoperable (cT1bN0), stage IB poorly differentiated invasive squamous cell carcinoma s/p radiation and boost with concurrent cisplatin, at [facility] with Dr. Lurline and Dr. Mancil, completed 05/27/22, with negative PET post completion, found to have vulvar erythema white epithelial areas with biopsy confirmed VIN1. Surgery vs topical aldara  were discussed and she elected for aldara . Started Aldara  M-W-F around Mar 14, 2024.   Her treatment was complicated due to erythema and pain. She was seen on 05/20/2024. Erythema had improved. Ulceration had improved. It was recommended to reduce dosing to twice a week, however she continued three times a week.  She stopped Aldara  three weeks ago. She c/o vulvar irritation.   Last pap 02/07/23- ASCUS HR HPV Negative. Plan for next Pap 01/2025.   Gyn Oncology History She presented to Dr Janit for her annual examination 02/04/22.  She states that she was doing well since last year but in February she had 3 to 4 days of vaginal bleeding almost like a period.  Then again early in March she had 1 day of bleeding.  She says she feels like something is tearing down there. She continues to use estrogen vaginal cream every other day.  Vulvar exam shows posterior fourchette with large apparently kissing lesion.  Very friable.  Erythematous.   Biopsy of vulva: At least intraepithelial squamous cell carcinoma (Carcinoma in-situ / VIN III). Invasive carcinoma cannot be excluded in the submitted biopsy due to superficial nature of the biopsy.   Seen by Gyn Oncology  02/16/22 VULVA, LEFT; BIOPSY: INVASIVE SQUAMOUS CELL CARCINOMA, P16 POSITIVE.  Sections demonstrate an invasive poorly differentiated  malignant neoplasm that focally undermines intact squamous mucosa. A limited panel of immunohistochemical stains was performed. The malignant neoplasm is positive for pancytokeratin, p63, and p16 (diffuse, block-like). This pattern of immunoreactivity supports the above diagnosis.   PET/CT 03/18/22 1. Focal uptake at the left posterior peritoneum/multiple compatible with known primary malignancy.  2. No evidence of metastatic disease.  3. Mildly enlarged thyroid  gland with diffusely increased uptake suggestive of Graves' disease or thyroiditis. Correlate clinically.   In view of diffuse involvement of vulva with VIN and invasive cancer decision made to treat with radiation.   Treated by Dr Lurline at [facility], IMRT 10X 5040cGy 48 180cGy 03/30/22-05/16/22  Vulva boost, IMRT 10X 1600cGy 10 200cGy 05/18/22-05/27/22 Total dose 6640cGy 59   08/31/22- PET  IMPRESSION:  1. Resolved hypermetabolic activity in the left vulva.  2.  No evidence of FDG avid metastatic disease.  3.  Findings suggestive of thyroiditis   Last pap 02/07/23- ASCUS HR HPV Negative. No history of abnormal pap previously.   She had a Biopsy on 01/17/2024 to assess vulvar irritation and area on exam notable for erythema and leukoplakia on the right vulva.    1. Vulva, biopsy, right, lesion :       - LOW-GRADE SQUAMOUS INTRAEPITHELIAL LESION (LSIL/VIN-1).  She had a biopsy check on 01/25/2024 and noted that biopsy site is healing well.    She was also started on vaginal dilator therapy daily x 10 minutes, but is having difficulty with that, and on 01/25/2024 she started vaginal estrogen therapy 2-3 nights a week.   Problem List: Patient Active  Problem List   Diagnosis Date Noted   Hypothyroidism 04/30/2024   Essential hypertension 04/30/2024   Dizziness 04/29/2024   Vulvar high-grade squamous intraepithelial lesion (HGSIL) 01/25/2024   Encounter for follow-up surveillance of vulvar cancer 01/17/2024   Vulvar lesion 01/17/2024    Vaginal dryness 01/17/2024   Vulvar cancer, carcinoma (HCC) 02/15/2022   Screening for colon cancer    Chronic GERD    BMI 45.0-49.9, adult (HCC) 12/03/2019   Endometrial hyperplasia without atypia, simple 07/21/2015   Diverticulosis 07/21/2015   Neuritis or radiculitis due to rupture of lumbar intervertebral disc 11/12/2014   Lumbar canal stenosis 11/12/2014   Bursitis, trochanteric 11/12/2014   Back ache 04/10/2014   Chronic anemia 04/10/2014   BP (high blood pressure) 04/10/2014   Blood glucose elevated 04/10/2014   HLD (hyperlipidemia) 04/10/2014   Adult hypothyroidism 04/10/2014   Adiposity 04/10/2014   Obstructive apnea 04/10/2014   Past Medical History: Past Medical History:  Diagnosis Date   Anxiety    Apnea    Complication of anesthesia    slow to wake up afterwards   Diverticulitis    Environmental allergies    Hypertension    Menopausal state    Obesity    PMB (postmenopausal bleeding)    PVD (peripheral vascular disease)    Rosacea    Simple endometrial hyperplasia    Sleep apnea    Thyroid  disease    Urinary urgency    Past Surgical History: Past Surgical History:  Procedure Laterality Date   CHOLECYSTECTOMY     COLONOSCOPY WITH PROPOFOL  N/A 05/05/2021   Procedure: COLONOSCOPY WITH PROPOFOL ;  Surgeon: Unk Corinn Skiff, MD;  Location: [facility] ENDOSCOPY;  Service: Gastroenterology;  Laterality: N/A;   DILATION AND CURETTAGE OF UTERUS     ESOPHAGOGASTRODUODENOSCOPY (EGD) WITH PROPOFOL  N/A 02/03/2021   Procedure: ESOPHAGOGASTRODUODENOSCOPY (EGD) WITH PROPOFOL ;  Surgeon: Unk Corinn Skiff, MD;  Location: [facility] ENDOSCOPY;  Service: Gastroenterology;  Laterality: N/A;   HYSTEROSCOPY     OB History:  OB History  Gravida Para Term Preterm AB Living  3 3 3   3   SAB IAB Ectopic Multiple Live Births      3    # Outcome Date GA Lbr Len/2nd Weight Sex Type Anes PTL Lv  3 Term 1978   7 lb 1.6 oz (3.221 kg) M Vag-Spont   LIV  2 Term 1977   7 lb (3.175 kg) M  Vag-Spont   LIV  1 Term 1975   7 lb 1.9 oz (3.23 kg) M Vag-Spont   LIV   Family History: Family History  Problem Relation Age of Onset   Diabetes Mother    Heart disease Mother    Diabetes Sister    Breast cancer Sister    Cancer Sister    Breast cancer Maternal Aunt 60   Lung cancer Maternal Aunt    Cancer Maternal Grandmother    Colon cancer Maternal Grandmother    Breast cancer Maternal Grandmother 4   Ovarian cancer Neg Hx    Social History: Social History   Socioeconomic History   Marital status: Divorced    Spouse name: Not on file   Number of children: Not on file   Years of education: Not on file   Highest education level: Not on file  Occupational History   Not on file  Tobacco Use   Smoking status: Never    Passive exposure: Yes   Smokeless tobacco: Never  Vaping Use   Vaping status: Never Used  Substance and Sexual Activity   Alcohol use: No   Drug use: No   Sexual activity: Not Currently  Other Topics Concern   Not on file  Social History Narrative   Lives alone at home.    Social Drivers of Health   Financial Resource Strain: Medium Risk (06/11/2024)   Received from [facility]   Overall Financial Resource Strain (CARDIA)    Difficulty of Paying Living Expenses: Somewhat hard  Food Insecurity: Food Insecurity Present (06/11/2024)   Received from [facility]   Hunger Vital Sign    Within the past 12 months, you worried that your food would run out before you got the money to buy more.: Often true    Within the past 12 months, the food you bought just didn't last and you didn't have money to get more.: Often true  Transportation Needs: No Transportation Needs (06/11/2024)   Received [facility]    In the past 12 months, has lack of transportation kept you from medical appointments or from getting medications?: No    Lack of Transportation (Non-Medical): No   Physical Activity: Inactive (01/11/2018)   Exercise Vital Sign    Days of Exercise per Week: 0 days    Minutes of Exercise per Session: 0 min  Stress: Not on file  Social Connections: Not on file  Intimate Partner Violence: Not on file   Allergies: Allergies  Allergen Reactions   Azithromycin Itching   Penicillins Itching   Current Medications: Current Outpatient Medications  Medication Sig Dispense Refill   aspirin  EC 81 MG tablet Take 81 mg by mouth daily.     azelastine  (ASTELIN ) 0.1 % nasal spray Place 2 sprays into both nostrils 2 (two) times daily. For seasonal allergies 30 mL 3   bisoprolol -hydrochlorothiazide  (ZIAC ) 5-6.25 MG tablet Take 1 tablet by mouth once daily 90 tablet 1   calcium  carbonate (OS-CAL) 600 MG TABS tablet Take 600 mg by mouth at bedtime.     celecoxib  (CELEBREX ) 200 MG capsule Take 1 capsule (200 mg total) by mouth 2 (two) times daily 180 capsule 1   cetirizine (ZYRTEC) 10 MG tablet Take 10 mg by mouth at bedtime.     docusate sodium  (COLACE) 100 MG capsule Take 100 mg by mouth 2 (two) times daily.     fluconazole  (DIFLUCAN ) 150 MG tablet Take 1 tablet (150 mg) by mouth every 3 days for vulvovaginal yeast. Use with concurrent cream 5 tablet 0   gabapentin  (NEURONTIN ) 300 MG capsule Take 1 capsule by mouth nightly 90 capsule 1   imiquimod  (ALDARA ) 5 % cream Apply pea sized amount to right vulvar lesion on Monday-Wednesday-Friday at bedtime. Wash off in morning. Apply vaseline to non-treatment skin. 12 each 0   isosorbide  mononitrate (IMDUR ) 30 MG 24 hr tablet TAKE 1 TABLET BY MOUTH DAILY 90 tablet 3   levothyroxine  (SYNTHROID ) 150 MCG tablet Take 1 tablet (150 mcg total) by mouth once daily. Take on an empty stomach with a glass of water at least 30-60 minutes before breakfast. 90 tablet 1   magnesium  oxide (MAG-OX) 400 MG tablet Take 1 tablet by mouth 2 (two) times daily.     meclizine  (ANTIVERT ) 25 MG tablet Take 1 tablet (25 mg total) by mouth 3 (three)  times daily as needed for dizziness. 30 tablet 0   metFORMIN (GLUCOPHAGE) 500 MG tablet Take 1 tablet (500 mg total) by mouth daily after lunch.  30 tablet 1   miconazole (MICOTIN) 2 % cream Apply 1 Application topically 2 (two) times daily. 28.35 g 1   Multiple Vitamins-Minerals (PRESERVISION AREDS 2+MULTI VIT PO) Take 1 capsule by mouth in the morning and at bedtime.     pantoprazole  (PROTONIX ) 40 MG tablet Take 40 mg by mouth 2 (two) times daily.     vitamin B-12 (CYANOCOBALAMIN ) 1000 MCG tablet Take 1,000 mcg by mouth daily.     estradiol  (ESTRACE ) 0.1 MG/GM vaginal cream Place 0.25 Applicatorfuls vaginally 3 (three) times a week. (Patient not taking: Reported on 09/11/2024) 43 g 2   No current facility-administered medications for this visit.   General: no complaints  HEENT: no complaints  Lungs: no complaints  Cardiac: no complaints  GI: no complaints  GU: no complaints  GYN: vulvar irritation Musculoskeletal: no complaints  Extremities: no complaints  Skin: no complaints  Neuro: no complaints  Endocrine: no complaints  Psych: no complaints       Objective:  Physical Examination:  BP 129/81   Pulse (!) 53   Temp 98.6 F (37 C)   Resp 17   Wt 277 lb 6.4 oz (125.8 kg)   SpO2 96%   BMI 49.14 kg/m    ECOG Performance Status: 0 - Asymptomatic  GENERAL: Patient is a well appearing female in no acute distress LUNGS: Normal respiratory effort NEURO:  Nonfocal. Well oriented.  Appropriate affect.  Pelvic: exam chaperoned by CME Vulva: normal appearing vulva with no masses, tenderness or lesions; V The remainder of the exam was deferred      Lab Review:  No labs on site today  Imaging Review: No imaging on site today   Assessment:  EVANA RUNNELS is a 68 y.o. female diagnosed with large area of VIN3 and squamous cell cancer of the vulva 4/23.  PET/CT negative for metastatic disease. Completed vulvar radiation 7/23. Follow up PET was negative in October 2023. NED  since. No definitive evidence of cancer on exam.    H/o VIN1 s/p Aldara  3 times a week around Mar 14, 2024 - 08/2024. Complicated by skin reaction, no completed therapy.   Symptomatic vulvar erythema c/w candidiasis. Prior HbA1c = 5.6  H/o Vaginal dryness and agglutination- on intravaginal estradiol - held d/t difficulty with multiple creams, discomfort.   Abdominal mass- benign appearing  ASCUS HPV negative  Medical co-morbidities complicating care: Morbid obesity, Sleep apnea, HTN.  Plan:   Problem List Items Addressed This Visit       Genitourinary   Vulvar high-grade squamous intraepithelial lesion (HGSIL) - Primary   Relevant Orders   Hemoglobin A1c   Other Visit Diagnoses       Vulvovaginal candidiasis       Relevant Medications   fluconazole  (DIFLUCAN ) 150 MG tablet   miconazole (MICOTIN) 2 % cream   Other Relevant Orders   Hemoglobin A1c     Vulva cancer (HCC)       Relevant Medications   fluconazole  (DIFLUCAN ) 150 MG tablet      She's now completed approximately complete treatment (protracted) with Aldara . No evidence of cancer or obvious VIN. However, candidiasis prevents an adequate evaluation.   Vulvar candidiasis, symptomatic. Plan to refer to Tinnie Dawn, NP for symptom management of severe vulvar candidiasis with high normal HbA1c this past year.   Abdominal mass- benign appearing. Possible lipoma? Monitor. If enlarging or changing, notify clinic for reevaluation, possible ultrasound.   When she returns we can do a full exam and discuss  continuation of vaginal dilator therapy daily every 10 minutes as directed on the package insert and vaginal estrogen therapy 2-3 times per week. On hold d/t patient preference.   Repeat Pap smear in March 2026  Disposition:  Follow 3-4 weeks.   Tinnie Dawn, DNP, AGNP-C, AOCNP [facility] [phone] (clinic)  I personally had a face to face interaction and evaluated the patient jointly with  the NP, Ms. Tinnie Dawn.  I have reviewed her history and available records and have performed the key portions of the physical exam including lymph node survey, abdominal exam, pelvic exam with my findings confirming those documented above by the APP.  I have discussed the case with the APP and the patient.  I agree with the above documentation, assessment and plan which was fully formulated by me.  Counseling was completed by me.   I personally saw the patient and performed a substantive portion of this encounter in conjunction with the listed APP as documented above.  Samy Ryner Isidor Constable, MD

## 2024-09-11 NOTE — Progress Notes (Signed)
 Symptom Management Clinic  [facility] at [facility] Department of the [place]. [facility] [address] [place], [place] [address] [phone] (phone) [phone] (fax)  Patient Care Team: Auston Reyes BIRCH, MD as PCP - General (Internal Medicine) Maurie Rayfield BIRCH, RN as Oncology Nurse Navigator   Name of the patient: Jazariah Teall  [id number]  June 05, 1956   Date of visit: 09/11/24  Diagnosis- VIN1  Chief complaint/ Reason for visit- Vulvovaginal yeast   Heme/Onc history: PAISLEA HATTON is a 68 y.o. female who is seen in consultation from Dr. Janit for VIN3 and invasive vulvar cancer, medically inoperable (cT1bN0), stage IB poorly differentiated invasive squamous cell carcinoma s/p radiation and boost with concurrent cisplatin, at [facility] with Dr. Lurline and Dr. Mancil, completed 05/27/22, with negative PET post completion, found to have vulvar erythema white epithelial areas with biopsy confirmed VIN1. Surgery vs topical aldara  were discussed and she elected for aldara . Started Aldara  M-W-F around Mar 14, 2024.     Interval history- Patient presents to symptom management clinic for complaints of vulvar irritation. She stopped topical aldara  three weeks ago. Presented to gyn onc today for evaluation and was found to have vulvar yeast infection. Symptoms ongoing for several weeks and worsening. Not diabetic. Weight rising.   Review of systems- Review of Systems  Constitutional:  Positive for malaise/fatigue.  Respiratory:  Negative for cough.   Gastrointestinal:  Positive for diarrhea.  Genitourinary:  Negative for dysuria.       Vulvar irritation, burning, discharge  Skin:  Negative for itching.  Endo/Heme/Allergies:  Negative for polydipsia.    Allergies  Allergen Reactions   Azithromycin Itching   Penicillins Itching   Past Medical History:  Diagnosis Date   Anxiety    Apnea    Complication of anesthesia    slow to wake up  afterwards   Diverticulitis    Environmental allergies    Hypertension    Menopausal state    Obesity    PMB (postmenopausal bleeding)    PVD (peripheral vascular disease)    Rosacea    Simple endometrial hyperplasia    Sleep apnea    Thyroid  disease    Urinary urgency    Past Surgical History:  Procedure Laterality Date   CHOLECYSTECTOMY     COLONOSCOPY WITH PROPOFOL  N/A 05/05/2021   Procedure: COLONOSCOPY WITH PROPOFOL ;  Surgeon: Unk Corinn Skiff, MD;  Location: [facility] ENDOSCOPY;  Service: Gastroenterology;  Laterality: N/A;   DILATION AND CURETTAGE OF UTERUS     ESOPHAGOGASTRODUODENOSCOPY (EGD) WITH PROPOFOL  N/A 02/03/2021   Procedure: ESOPHAGOGASTRODUODENOSCOPY (EGD) WITH PROPOFOL ;  Surgeon: Unk Corinn Skiff, MD;  Location: [facility] ENDOSCOPY;  Service: Gastroenterology;  Laterality: N/A;   HYSTEROSCOPY     Social History   Socioeconomic History   Marital status: Divorced    Spouse name: Not on file   Number of children: Not on file   Years of education: Not on file   Highest education level: Not on file  Occupational History   Not on file  Tobacco Use   Smoking status: Never    Passive exposure: Yes   Smokeless tobacco: Never  Vaping Use   Vaping status: Never Used  Substance and Sexual Activity   Alcohol use: No   Drug use: No   Sexual activity: Not Currently  Other Topics Concern   Not on file  Social History Narrative   Lives alone at home.    Social Drivers of Health  Financial Resource Strain: Medium Risk (06/11/2024)   Received from [facility]   Overall Financial Resource Strain (CARDIA)    Difficulty of Paying Living Expenses: Somewhat hard  Food Insecurity: Food Insecurity Present (06/11/2024)   Received from [facility]   Hunger Vital Sign    Within the past 12 months, you worried that your food would run out before you got the money to buy more.: Often true    Within the past 12 months, the food you bought  just didn't last and you didn't have money to get more.: Often true  Transportation Needs: No Transportation Needs (06/11/2024)   Received from [facility] - Transportation    In the past 12 months, has lack of transportation kept you from medical appointments or from getting medications?: No    Lack of Transportation (Non-Medical): No  Physical Activity: Inactive (01/11/2018)   Exercise Vital Sign    Days of Exercise per Week: 0 days    Minutes of Exercise per Session: 0 min  Stress: Not on file  Social Connections: Not on file  Intimate Partner Violence: Not on file     Current Outpatient Medications:    ciprofloxacin (CIPRO) 500 MG tablet, Take 500 mg by mouth 2 (two) times daily., Disp: , Rfl:    metroNIDAZOLE (FLAGYL) 500 MG tablet, Take 500 mg by mouth 2 (two) times daily., Disp: , Rfl:    aspirin  EC 81 MG tablet, Take 81 mg by mouth daily., Disp: , Rfl:    azelastine  (ASTELIN ) 0.1 % nasal spray, Place 2 sprays into both nostrils 2 (two) times daily. For seasonal allergies, Disp: 30 mL, Rfl: 3   bisoprolol -hydrochlorothiazide  (ZIAC ) 5-6.25 MG tablet, Take 1 tablet by mouth once daily, Disp: 90 tablet, Rfl: 1   calcium  carbonate (OS-CAL) 600 MG TABS tablet, Take 600 mg by mouth at bedtime., Disp: , Rfl:    celecoxib  (CELEBREX ) 200 MG capsule, Take 1 capsule (200 mg total) by mouth 2 (two) times daily, Disp: 180 capsule, Rfl: 1   cetirizine (ZYRTEC) 10 MG tablet, Take 10 mg by mouth at bedtime., Disp: , Rfl:    docusate sodium  (COLACE) 100 MG capsule, Take 100 mg by mouth 2 (two) times daily., Disp: , Rfl:    estradiol  (ESTRACE ) 0.1 MG/GM vaginal cream, Place 0.25 Applicatorfuls vaginally 3 (three) times a week. (Patient not taking: Reported on 09/11/2024), Disp: 43 g, Rfl: 2   fluconazole  (DIFLUCAN ) 150 MG tablet, Take 1 tablet (150 mg) by mouth every 3 days for vulvovaginal yeast. Use with concurrent cream, Disp: 5 tablet, Rfl: 0   gabapentin  (NEURONTIN )  300 MG capsule, Take 1 capsule by mouth nightly, Disp: 90 capsule, Rfl: 1   imiquimod  (ALDARA ) 5 % cream, Apply pea sized amount to right vulvar lesion on Monday-Wednesday-Friday at bedtime. Wash off in morning. Apply vaseline to non-treatment skin., Disp: 12 each, Rfl: 0   isosorbide  mononitrate (IMDUR ) 30 MG 24 hr tablet, TAKE 1 TABLET BY MOUTH DAILY, Disp: 90 tablet, Rfl: 3   levothyroxine  (SYNTHROID ) 150 MCG tablet, Take 1 tablet (150 mcg total) by mouth once daily. Take on an empty stomach with a glass of water at least 30-60 minutes before breakfast., Disp: 90 tablet, Rfl: 1   magnesium  oxide (MAG-OX) 400 MG tablet, Take 1 tablet by mouth 2 (two) times daily., Disp: , Rfl:    meclizine  (ANTIVERT ) 25 MG tablet, Take 1 tablet (25 mg total) by mouth 3 (three)  times daily as needed for dizziness., Disp: 30 tablet, Rfl: 0   metFORMIN (GLUCOPHAGE) 500 MG tablet, Take 1 tablet (500 mg total) by mouth daily after lunch., Disp: 30 tablet, Rfl: 1   miconazole (MICOTIN) 2 % cream, Apply 1 Application topically 2 (two) times daily., Disp: 28.35 g, Rfl: 1   Multiple Vitamins-Minerals (PRESERVISION AREDS 2+MULTI VIT PO), Take 1 capsule by mouth in the morning and at bedtime., Disp: , Rfl:    pantoprazole  (PROTONIX ) 40 MG tablet, Take 40 mg by mouth 2 (two) times daily., Disp: , Rfl:    vitamin B-12 (CYANOCOBALAMIN ) 1000 MCG tablet, Take 1,000 mcg by mouth daily., Disp: , Rfl:   Physical exam: There were no vitals filed for this visit. Physical Exam Vitals reviewed. Exam conducted with a chaperone present.  Constitutional:      Appearance: She is obese.  Genitourinary:    Labia:        Right: Tenderness present.        Left: Tenderness present.      Comments: White thick film across vaginal surfaces consistent with yeast.  Neurological:     Mental Status: She is alert and oriented to person, place, and time.  Psychiatric:        Mood and Affect: Mood normal.        Behavior: Behavior normal.      Assessment and plan- Patient is a 68 y.o. female diagnosed with VIN1 who presents to symptom management clinic for:   Vulvovaginal candidiasis- start diflucan  150 mg every 3 days for 5 doses. Add topical monistat, available OTC to vulvar twice a day for two weeks.  VIN1- hold aldara . Follo wup with Dr Elby as planned.  Metabolic Syndrome- question underlying insulin resistance and suspect that elevated glucose is contributing to her vulvovaginal yeast. Last HgA1c on 04/16/24 with PCP was 6 with average blood glucose of 126. Abdominal Obesity with waist circumference >35, triglyceride > 150 (174), hypertension. Discussed that metformin can be beneficial in managing metabolic syndrome particularly in individuals at high risk of developing type 2 diabetes. It does this by improving insulin sensitivity, reducing fasting glucose, and may help support weight loss. We discussed that metformin may cause GI side effects. Recommend metformin 500 mg at lunch. Can increase to 500 mg twice a day. Take with meals to reduce GI side effects. May experience diarrhea with high fat, high carbohydrate intake.    Visit Diagnosis 1. Vulvovaginal candidiasis   2. Metabolic syndrome    Patient expressed understanding and was in agreement with this plan. She also understands that She can call clinic at any time with any questions, concerns, or complaints.   Thank you for allowing me to participate in the care of this very pleasant patient.   Tinnie Dawn, DNP, AGNP-C, AOCNP [facility] [phone]

## 2024-10-02 ENCOUNTER — Ambulatory Visit: Payer: Self-pay | Admitting: Obstetrics and Gynecology

## 2024-10-09 ENCOUNTER — Inpatient Hospital Stay

## 2024-10-16 ENCOUNTER — Inpatient Hospital Stay: Attending: Obstetrics and Gynecology | Admitting: Obstetrics and Gynecology

## 2024-10-16 VITALS — BP 131/79 | HR 64 | Temp 97.0°F | Ht 63.0 in | Wt 271.0 lb

## 2024-10-16 DIAGNOSIS — Z9221 Personal history of antineoplastic chemotherapy: Secondary | ICD-10-CM | POA: Diagnosis not present

## 2024-10-16 DIAGNOSIS — Z923 Personal history of irradiation: Secondary | ICD-10-CM | POA: Insufficient documentation

## 2024-10-16 DIAGNOSIS — Z8544 Personal history of malignant neoplasm of other female genital organs: Secondary | ICD-10-CM | POA: Insufficient documentation

## 2024-10-16 DIAGNOSIS — Y842 Radiological procedure and radiotherapy as the cause of abnormal reaction of the patient, or of later complication, without mention of misadventure at the time of the procedure: Secondary | ICD-10-CM | POA: Insufficient documentation

## 2024-10-16 DIAGNOSIS — Z79890 Hormone replacement therapy (postmenopausal): Secondary | ICD-10-CM | POA: Insufficient documentation

## 2024-10-16 DIAGNOSIS — Z79899 Other long term (current) drug therapy: Secondary | ICD-10-CM | POA: Insufficient documentation

## 2024-10-16 DIAGNOSIS — Z08 Encounter for follow-up examination after completed treatment for malignant neoplasm: Secondary | ICD-10-CM | POA: Diagnosis present

## 2024-10-16 DIAGNOSIS — Q525 Fusion of labia: Secondary | ICD-10-CM | POA: Diagnosis not present

## 2024-10-16 DIAGNOSIS — C519 Malignant neoplasm of vulva, unspecified: Secondary | ICD-10-CM

## 2024-10-16 NOTE — Progress Notes (Signed)
 Gynecologic Oncology Interval Visit   Referring Provider: Dr Janit  Chief Concern: VIN 1 - aldara  follow up. Hx of Vulvar cancer s/p radiation  Subjective:  Rebekah Perkins is a 68 y.o. female who is seen in consultation from Dr. Janit for VIN3 and invasive vulvar cancer, medically inoperable (cT1bN0), stage IB poorly differentiated invasive squamous cell carcinoma s/p radiation and boost with concurrent cisplatin, at [facility] with Dr. Lurline and Dr. Mancil, completed 05/27/22, with negative PET post completion, found to have vulvar erythema white epithelial areas with biopsy confirmed VIN1. Surgery vs topical aldara  were discussed and she elected for aldara . Started Aldara  M-W-F around Mar 14, 2024.   Her treatment was complicated due to erythema and pain. She was seen on 05/20/2024. Erythema had improved. Ulceration had improved. It was recommended to reduce dosing to twice a week, however she continued three times a week.  Only using Aldera intermittently now.  Seen 5 weeks ago and thought to have yeast infection that was treated with Diflucan  and Monistat .  She thinks the symptoms are a bit better.    Last pap 02/07/23- ASCUS HR HPV Negative. Plan for next Pap 01/2025.   Gyn Oncology History She presented to Dr Janit for her annual examination 02/04/22.  She states that she was doing well since last year but in February she had 3 to 4 days of vaginal bleeding almost like a period.  Then again early in March she had 1 day of bleeding.  She says she feels like something is tearing down there. She continues to use estrogen vaginal cream every other day.  Vulvar exam shows posterior fourchette with large apparently kissing lesion.  Very friable.  Erythematous.   Biopsy of vulva: At least intraepithelial squamous cell carcinoma (Carcinoma in-situ / VIN III). Invasive carcinoma cannot be excluded in the submitted biopsy due to superficial nature of the biopsy.   Seen by Gyn Oncology  02/16/22 VULVA, LEFT;  BIOPSY: INVASIVE SQUAMOUS CELL CARCINOMA, P16 POSITIVE.  Sections demonstrate an invasive poorly differentiated malignant neoplasm that focally undermines intact squamous mucosa. A limited panel of immunohistochemical stains was performed. The malignant neoplasm is positive for pancytokeratin, p63, and p16 (diffuse, block-like). This pattern of immunoreactivity supports the above diagnosis.   PET/CT 03/18/22 1. Focal uptake at the left posterior peritoneum/multiple compatible with known primary malignancy.  2. No evidence of metastatic disease.  3. Mildly enlarged thyroid  gland with diffusely increased uptake suggestive of Graves' disease or thyroiditis. Correlate clinically.   In view of diffuse involvement of vulva with VIN and invasive cancer decision made to treat with radiation.   Treated by Dr Lurline at [facility], IMRT 10X 5040cGy 48 180cGy 03/30/22-05/16/22  Vulva boost, IMRT 10X 1600cGy 10 200cGy 05/18/22-05/27/22 Total dose 6640cGy 59   08/31/22- PET  IMPRESSION:  1. Resolved hypermetabolic activity in the left vulva.  2.  No evidence of FDG avid metastatic disease.  3.  Findings suggestive of thyroiditis   Last pap 02/07/23- ASCUS HR HPV Negative. No history of abnormal pap previously.   She had a Biopsy on 01/17/2024 to assess vulvar irritation and area on exam notable for erythema and leukoplakia on the right vulva.    1. Vulva, biopsy, right, lesion :       - LOW-GRADE SQUAMOUS INTRAEPITHELIAL LESION (LSIL/VIN-1).  She had a biopsy check on 01/25/2024 and noted that biopsy site is healing well.    She was also started on vaginal dilator therapy daily x 10 minutes, but  is having difficulty with that, and on 01/25/2024 she started vaginal estrogen therapy 2-3 nights a week.   Problem List: Patient Active Problem List   Diagnosis Date Noted   Metabolic syndrome 09/11/2024   Hypothyroidism 04/30/2024   Essential hypertension 04/30/2024   Dizziness 04/29/2024   Vulvar high-grade  squamous intraepithelial lesion (HGSIL) 01/25/2024   Encounter for follow-up surveillance of vulvar cancer 01/17/2024   Vulvar lesion 01/17/2024   Vaginal dryness 01/17/2024   Prediabetes 12/07/2022   Vulvar cancer (HCC) 03/09/2022   Vulvar cancer, carcinoma (HCC) 02/15/2022   Screening for colon cancer    Chronic GERD    BMI 45.0-49.9, adult (HCC) 12/03/2019   Endometrial hyperplasia without atypia, simple 07/21/2015   Diverticulosis 07/21/2015   Neuritis or radiculitis due to rupture of lumbar intervertebral disc 11/12/2014   Lumbar canal stenosis 11/12/2014   Bursitis, trochanteric 11/12/2014   Back ache 04/10/2014   Chronic anemia 04/10/2014   BP (high blood pressure) 04/10/2014   Blood glucose elevated 04/10/2014   HLD (hyperlipidemia) 04/10/2014   Adult hypothyroidism 04/10/2014   Adiposity 04/10/2014   Obstructive apnea 04/10/2014   Past Medical History: Past Medical History:  Diagnosis Date   Anxiety    Apnea    Complication of anesthesia    slow to wake up afterwards   Diverticulitis    Environmental allergies    Hypertension    Menopausal state    Obesity    PMB (postmenopausal bleeding)    PVD (peripheral vascular disease)    Rosacea    Simple endometrial hyperplasia    Sleep apnea    Thyroid  disease    Urinary urgency    Past Surgical History: Past Surgical History:  Procedure Laterality Date   CHOLECYSTECTOMY     COLONOSCOPY WITH PROPOFOL  N/A 05/05/2021   Procedure: COLONOSCOPY WITH PROPOFOL ;  Surgeon: Unk Corinn Skiff, MD;  Location: [facility] ENDOSCOPY;  Service: Gastroenterology;  Laterality: N/A;   DILATION AND CURETTAGE OF UTERUS     ESOPHAGOGASTRODUODENOSCOPY (EGD) WITH PROPOFOL  N/A 02/03/2021   Procedure: ESOPHAGOGASTRODUODENOSCOPY (EGD) WITH PROPOFOL ;  Surgeon: Unk Corinn Skiff, MD;  Location: [facility] ENDOSCOPY;  Service: Gastroenterology;  Laterality: N/A;   HYSTEROSCOPY     OB History:  OB History  Gravida Para Term Preterm AB Living  3 3 3    3   SAB IAB Ectopic Multiple Live Births      3    # Outcome Date GA Lbr Len/2nd Weight Sex Type Anes PTL Lv  3 Term 1978   7 lb 1.6 oz (3.221 kg) M Vag-Spont   LIV  2 Term 1977   7 lb (3.175 kg) M Vag-Spont   LIV  1 Term 1975   7 lb 1.9 oz (3.23 kg) M Vag-Spont   LIV   Family History: Family History  Problem Relation Age of Onset   Diabetes Mother    Heart disease Mother    Diabetes Sister    Breast cancer Sister    Cancer Sister    Breast cancer Maternal Aunt 60   Lung cancer Maternal Aunt    Cancer Maternal Grandmother    Colon cancer Maternal Grandmother    Breast cancer Maternal Grandmother 80   Ovarian cancer Neg Hx    Social History: Social History   Socioeconomic History   Marital status: Divorced    Spouse name: Not on file   Number of children: Not on file   Years of education: Not on file   Highest education level: Not on  file  Occupational History   Not on file  Tobacco Use   Smoking status: Never    Passive exposure: Yes   Smokeless tobacco: Never  Vaping Use   Vaping status: Never Used  Substance and Sexual Activity   Alcohol use: No   Drug use: No   Sexual activity: Not Currently  Other Topics Concern   Not on file  Social History Narrative   Lives alone at home.    Social Drivers of Health   Financial Resource Strain: Medium Risk (06/11/2024)   Received from [facility]   Overall Financial Resource Strain (CARDIA)    Difficulty of Paying Living Expenses: Somewhat hard  Food Insecurity: Food Insecurity Present (06/11/2024)   Received from [facility]   Hunger Vital Sign    Within the past 12 months, you worried that your food would run out before you got the money to buy more.: Often true    Within the past 12 months, the food you bought just didn't last and you didn't have money to get more.: Often true  Transportation Needs: No Transportation Needs (06/11/2024)   Received from [facility] - [facility]    In the past 12 months, has lack of transportation kept you from medical appointments or from getting medications?: No    Lack of Transportation (Non-Medical): No  Physical Activity: Inactive (01/11/2018)   Exercise Vital Sign    Days of Exercise per Week: 0 days    Minutes of Exercise per Session: 0 min  Stress: Not on file  Social Connections: Not on file  Intimate Partner Violence: Not on file   Allergies: Allergies  Allergen Reactions   Azithromycin Itching   Penicillins Itching   Current Medications: Current Outpatient Medications  Medication Sig Dispense Refill   aspirin  EC 81 MG tablet Take 81 mg by mouth daily.     azelastine  (ASTELIN ) 0.1 % nasal spray Place 2 sprays into both nostrils 2 (two) times daily. For seasonal allergies 30 mL 3   bisoprolol -hydrochlorothiazide  (ZIAC ) 5-6.25 MG tablet Take 1 tablet by mouth once daily 90 tablet 1   calcium  carbonate (OS-CAL) 600 MG TABS tablet Take 600 mg by mouth at bedtime.     celecoxib  (CELEBREX ) 200 MG capsule Take 1 capsule (200 mg total) by mouth 2 (two) times daily 180 capsule 1   cetirizine (ZYRTEC) 10 MG tablet Take 10 mg by mouth at bedtime.     ciprofloxacin (CIPRO) 500 MG tablet Take 500 mg by mouth 2 (two) times daily.     docusate sodium  (COLACE) 100 MG capsule Take 100 mg by mouth 2 (two) times daily.     estradiol  (ESTRACE ) 0.1 MG/GM vaginal cream Place 0.25 Applicatorfuls vaginally 3 (three) times a week. (Patient not taking: Reported on 09/11/2024) 43 g 2   fluconazole  (DIFLUCAN ) 150 MG tablet Take 1 tablet (150 mg) by mouth every 3 days for vulvovaginal yeast. Use with concurrent cream 5 tablet 0   gabapentin  (NEURONTIN ) 300 MG capsule Take 1 capsule by mouth nightly 90 capsule 1   imiquimod  (ALDARA ) 5 % cream Apply pea sized amount to right vulvar lesion on Monday-Wednesday-Friday at bedtime. Wash off in morning. Apply vaseline to non-treatment skin. 12 each 0   isosorbide   mononitrate (IMDUR ) 30 MG 24 hr tablet TAKE 1 TABLET BY MOUTH DAILY 90 tablet 3   levothyroxine  (SYNTHROID ) 150 MCG tablet Take 1 tablet (150 mcg total) by mouth once daily.  Take on an empty stomach with a glass of water at least 30-60 minutes before breakfast. 90 tablet 1   magnesium  oxide (MAG-OX) 400 MG tablet Take 1 tablet by mouth 2 (two) times daily.     meclizine  (ANTIVERT ) 25 MG tablet Take 1 tablet (25 mg total) by mouth 3 (three) times daily as needed for dizziness. 30 tablet 0   metFORMIN  (GLUCOPHAGE ) 500 MG tablet Take 1 tablet (500 mg total) by mouth daily after lunch. 30 tablet 1   metroNIDAZOLE (FLAGYL) 500 MG tablet Take 500 mg by mouth 2 (two) times daily.     miconazole  (MICOTIN) 2 % cream Apply 1 Application topically 2 (two) times daily. 28.35 g 1   Multiple Vitamins-Minerals (PRESERVISION AREDS 2+MULTI VIT PO) Take 1 capsule by mouth in the morning and at bedtime.     pantoprazole  (PROTONIX ) 40 MG tablet Take 40 mg by mouth 2 (two) times daily.     vitamin B-12 (CYANOCOBALAMIN ) 1000 MCG tablet Take 1,000 mcg by mouth daily.     No current facility-administered medications for this visit.   General: no complaints  HEENT: no complaints  Lungs: no complaints  Cardiac: no complaints  GI: no complaints  GU: no complaints  GYN: vulvar irritation Musculoskeletal: no complaints  Extremities: no complaints  Skin: no complaints  Neuro: no complaints  Endocrine: no complaints  Psych: no complaints       Objective:  Physical Examination:  BP 131/79 (BP Location: Left Arm, Patient Position: Sitting)   Pulse 64   Temp (!) 97 F (36.1 C) (Tympanic)   Ht 5' 3 (1.6 m)   Wt 271 lb (122.9 kg)   SpO2 99%   BMI 48.01 kg/m    ECOG Performance Status: 0 - Asymptomatic  GENERAL: Patient is a well appearing female in no acute distress LUNGS: Normal respiratory effort NEURO:  Nonfocal. Well oriented.  Appropriate affect.  Pelvic: exam chaperoned by CME Vulva: chronic  radiation changes to skin with some erythema of the labia majora.  No ulceration or discrete concerning lesions.  Vagina: atrophic  Lab Review:  No labs on site today  Imaging Review: No imaging on site today   Assessment:  Rebekah Perkins is a 68 y.o. female diagnosed with large area of VIN3 and squamous cell cancer of the vulva 4/23.  PET/CT negative for metastatic disease. Completed vulvar radiation 7/23. Follow up PET was negative in October 2023. NED since. No definitive evidence of cancer on exam.    H/o VIN1 s/p Aldara  3 times a week around Mar 14, 2024 - 08/2024 with some skin reaction.  Seen 5 weeks ago and thought to have yeast infection that was treated with Diflucan  and Monistat .  She thinks the symptoms are a bit better. My impression is that most of her issues are related to chronic radiation changes and irritation from Aldera. No evidence of cancer or obvious VIN.   H/o Vaginal dryness and agglutination- on intravaginal estradiol - held d/t difficulty with multiple creams, discomfort.   Abdominal mass- benign appearing  ASCUS HPV negative  Medical co-morbidities complicating care: Morbid obesity, Sleep apnea, HTN.  Plan:   Problem List Items Addressed This Visit       Genitourinary   Vulvar cancer, carcinoma (HCC) - Primary    She will RTC in 4 months for further follow up.  Discussed strategies to keep the vulvar area drier to discourage yeast infections.   Abdominal mass- benign appearing. Possible lipoma? Monitor. If enlarging or  changing, notify clinic for reevaluation, possible ultrasound.    Repeat Pap smear in March 2026    Prentice Agent, MD

## 2024-10-30 ENCOUNTER — Inpatient Hospital Stay: Admission: RE | Admit: 2024-10-30 | Discharge: 2024-10-30 | Attending: Internal Medicine | Admitting: Internal Medicine

## 2024-10-30 DIAGNOSIS — Z1231 Encounter for screening mammogram for malignant neoplasm of breast: Secondary | ICD-10-CM | POA: Diagnosis present

## 2024-12-20 ENCOUNTER — Other Ambulatory Visit: Payer: Self-pay | Admitting: Nurse Practitioner

## 2025-02-19 ENCOUNTER — Inpatient Hospital Stay
